# Patient Record
Sex: Female | Born: 1970 | Race: White | Hispanic: No | Marital: Married | Smoking: Current some day smoker
Health system: Southern US, Community
[De-identification: ages and names within clinical notes are randomized; demographics above are authoritative.]

## PROBLEM LIST (undated history)

## (undated) DIAGNOSIS — F419 Anxiety disorder, unspecified: Secondary | ICD-10-CM

## (undated) DIAGNOSIS — M1990 Unspecified osteoarthritis, unspecified site: Secondary | ICD-10-CM

## (undated) DIAGNOSIS — N360 Urethral fistula: Secondary | ICD-10-CM

## (undated) DIAGNOSIS — N3946 Mixed incontinence: Secondary | ICD-10-CM

## (undated) DIAGNOSIS — G5601 Carpal tunnel syndrome, right upper limb: Secondary | ICD-10-CM

## (undated) DIAGNOSIS — J45909 Unspecified asthma, uncomplicated: Secondary | ICD-10-CM

## (undated) DIAGNOSIS — Z87440 Personal history of urinary (tract) infections: Secondary | ICD-10-CM

## (undated) DIAGNOSIS — T8859XA Other complications of anesthesia, initial encounter: Secondary | ICD-10-CM

## (undated) DIAGNOSIS — M0600 Rheumatoid arthritis without rheumatoid factor, unspecified site: Secondary | ICD-10-CM

## (undated) DIAGNOSIS — F32A Depression, unspecified: Secondary | ICD-10-CM

## (undated) DIAGNOSIS — Z8619 Personal history of other infectious and parasitic diseases: Secondary | ICD-10-CM

## (undated) DIAGNOSIS — I10 Essential (primary) hypertension: Secondary | ICD-10-CM

## (undated) DIAGNOSIS — F329 Major depressive disorder, single episode, unspecified: Secondary | ICD-10-CM

## (undated) DIAGNOSIS — G43019 Migraine without aura, intractable, without status migrainosus: Secondary | ICD-10-CM

## (undated) DIAGNOSIS — M797 Fibromyalgia: Secondary | ICD-10-CM

## (undated) HISTORY — DX: Personal history of other infectious and parasitic diseases: Z86.19

## (undated) HISTORY — DX: Urethral fistula: N36.0

## (undated) HISTORY — DX: Personal history of urinary (tract) infections: Z87.440

## (undated) HISTORY — DX: Depression, unspecified: F32.A

## (undated) HISTORY — DX: Mixed incontinence: N39.46

## (undated) HISTORY — DX: Major depressive disorder, single episode, unspecified: F32.9

## (undated) HISTORY — DX: Unspecified osteoarthritis, unspecified site: M19.90

## (undated) HISTORY — DX: Rheumatoid arthritis without rheumatoid factor, unspecified site: M06.00

## (undated) HISTORY — DX: Carpal tunnel syndrome, right upper limb: G56.01

## (undated) HISTORY — DX: Fibromyalgia: M79.7

## (undated) HISTORY — DX: Migraine without aura, intractable, without status migrainosus: G43.019

## (undated) HISTORY — DX: Essential (primary) hypertension: I10

---

## 2006-11-28 DIAGNOSIS — Z8619 Personal history of other infectious and parasitic diseases: Secondary | ICD-10-CM

## 2006-11-28 HISTORY — DX: Personal history of other infectious and parasitic diseases: Z86.19

## 2017-03-01 LAB — TSH: TSH: 1.45 (ref 0.41–5.90)

## 2017-03-01 LAB — BASIC METABOLIC PANEL
BUN: 13 (ref 4–21)
Creatinine: 0.8 (ref 0.5–1.1)
Glucose: 99
POTASSIUM: 4.6 (ref 3.4–5.3)
SODIUM: 140 (ref 137–147)

## 2017-03-01 LAB — HEPATIC FUNCTION PANEL
ALK PHOS: 34 (ref 25–125)
ALT: 22 (ref 7–35)
AST: 21 (ref 13–35)
Bilirubin, Total: 0.5

## 2017-03-02 ENCOUNTER — Other Ambulatory Visit: Payer: Self-pay | Admitting: Family Medicine

## 2017-03-02 DIAGNOSIS — E010 Iodine-deficiency related diffuse (endemic) goiter: Secondary | ICD-10-CM

## 2017-03-10 ENCOUNTER — Ambulatory Visit
Admission: RE | Admit: 2017-03-10 | Discharge: 2017-03-10 | Disposition: A | Discharge status: Home / Self Care | Payer: 59 | Source: Ambulatory Visit | Attending: Family Medicine | Admitting: Family Medicine

## 2017-03-10 DIAGNOSIS — E010 Iodine-deficiency related diffuse (endemic) goiter: Secondary | ICD-10-CM

## 2017-03-15 ENCOUNTER — Ambulatory Visit (INDEPENDENT_AMBULATORY_CARE_PROVIDER_SITE_OTHER): Payer: Self-pay | Admitting: Neurology

## 2017-03-15 ENCOUNTER — Ambulatory Visit (INDEPENDENT_AMBULATORY_CARE_PROVIDER_SITE_OTHER): Payer: 59 | Admitting: Neurology

## 2017-03-15 ENCOUNTER — Encounter: Payer: Self-pay | Admitting: Neurology

## 2017-03-15 DIAGNOSIS — G5601 Carpal tunnel syndrome, right upper limb: Secondary | ICD-10-CM | POA: Diagnosis not present

## 2017-03-15 HISTORY — DX: Carpal tunnel syndrome, right upper limb: G56.01

## 2017-03-15 NOTE — Progress Notes (Signed)
Please refer to EMG and nerve conduction study procedure note. 

## 2017-03-15 NOTE — Procedures (Signed)
     HISTORY:  Krystal Grant is a 46 year old patient with a history of right hand numbness and paresthesias with discomfort going into the right shoulder since February 2018. At times, the discomfort can be quite severe. The patient denies any neck pain or headaches. At times she may have minimal symptoms of numbness involving the left hand, but her predominant symptoms are involving the right hand and right arm.  NERVE CONDUCTION STUDIES:  Nerve conduction studies were performed on both upper extremities. The distal motor latencies for the median nerves were prolonged on the right, normal on the left, and normal for the ulnar nerves bilaterally. The motor amplitudes for the median and ulnar nerves were normal bilaterally, and the nerve conduction velocities for these nerves were also normal. The sensory latencies for the median nerves were prolonged on the right, normal on the left, and normal for the ulnar nerves bilaterally. The F wave latencies for the median and ulnar nerves were normal bilaterally.  EMG STUDIES:  EMG study was performed on the right upper extremity:  The first dorsal interosseous muscle reveals 2 to 4 K units with full recruitment. No fibrillations or positive waves were noted. The abductor pollicis brevis muscle reveals 2 to 4 K units with full recruitment. No fibrillations or positive waves were noted. The extensor indicis proprius muscle reveals 1 to 3 K units with full recruitment. No fibrillations or positive waves were noted. The pronator teres muscle reveals 2 to 3 K units with full recruitment. No fibrillations or positive waves were noted. The biceps muscle reveals 1 to 2 K units with full recruitment. No fibrillations or positive waves were noted. The triceps muscle reveals 2 to 4 K units with full recruitment. No fibrillations or positive waves were noted. The anterior deltoid muscle reveals 2 to 3 K units with full recruitment. No fibrillations or positive  waves were noted. The cervical paraspinal muscles were tested at 2 levels. No abnormalities of insertional activity were seen at either level tested. There was fair relaxation.   IMPRESSION:  Nerve conduction studies done on both upper extremities revealed evidence of a mild right carpal tunnel syndrome. EMG evaluation of the right upper extremity was unremarkable, without evidence of an overlying cervical radiculopathy.  Marlan Palau MD 03/15/2017 11:32 AM  [facility] [address] [place], [place] [address]  Phone [phone] Fax [phone]

## 2017-06-28 HISTORY — PX: CARPAL TUNNEL RELEASE: SHX101

## 2017-06-28 HISTORY — PX: ELBOW ARTHROSCOPY: SUR87

## 2018-01-18 LAB — HEPATIC FUNCTION PANEL
ALK PHOS: 34 (ref 25–125)
ALT: 15 (ref 7–35)
AST: 17 (ref 13–35)
Bilirubin, Total: 0.4

## 2018-01-18 LAB — BASIC METABOLIC PANEL
BUN: 10 (ref 4–21)
Creatinine: 0.8 (ref 0.5–1.1)
Glucose: 105
Potassium: 4.5 (ref 3.4–5.3)
SODIUM: 140 (ref 137–147)

## 2018-01-18 LAB — TSH: TSH: 1 (ref 0.41–5.90)

## 2018-01-21 ENCOUNTER — Encounter (HOSPITAL_COMMUNITY): Payer: Self-pay

## 2018-01-21 ENCOUNTER — Emergency Department (HOSPITAL_COMMUNITY)
Admission: EM | Admit: 2018-01-21 | Discharge: 2018-01-21 | Disposition: A | Discharge status: Home / Self Care | Payer: 59 | Attending: Emergency Medicine | Admitting: Emergency Medicine

## 2018-01-21 DIAGNOSIS — Z79899 Other long term (current) drug therapy: Secondary | ICD-10-CM | POA: Insufficient documentation

## 2018-01-21 DIAGNOSIS — R42 Dizziness and giddiness: Secondary | ICD-10-CM | POA: Diagnosis not present

## 2018-01-21 DIAGNOSIS — R195 Other fecal abnormalities: Secondary | ICD-10-CM | POA: Insufficient documentation

## 2018-01-21 LAB — COMPREHENSIVE METABOLIC PANEL
ALT: 16 U/L (ref 14–54)
AST: 24 U/L (ref 15–41)
Albumin: 4.2 g/dL (ref 3.5–5.0)
Alkaline Phosphatase: 42 U/L (ref 38–126)
Anion gap: 9 (ref 5–15)
BILIRUBIN TOTAL: 0.3 mg/dL (ref 0.3–1.2)
BUN: 8 mg/dL (ref 6–20)
CO2: 23 mmol/L (ref 22–32)
Calcium: 9.3 mg/dL (ref 8.9–10.3)
Chloride: 108 mmol/L (ref 101–111)
Creatinine, Ser: 0.72 mg/dL (ref 0.44–1.00)
GFR calc Af Amer: >60 mL/min (ref >60)
Glucose, Bld: 91 mg/dL (ref 65–99)
POTASSIUM: 4.4 mmol/L (ref 3.5–5.1)
Sodium: 140 mmol/L (ref 135–145)
Total Protein: 6.6 g/dL (ref 6.5–8.1)

## 2018-01-21 LAB — CBC
HEMATOCRIT: 36.6 % (ref 36.0–46.0)
Hemoglobin: 12.2 g/dL (ref 12.0–15.0)
MCH: 28.8 pg (ref 26.0–34.0)
MCHC: 33.3 g/dL (ref 30.0–36.0)
MCV: 86.5 fL (ref 78.0–100.0)
PLATELETS: 267 10*3/uL (ref 150–400)
RBC: 4.23 MIL/uL (ref 3.87–5.11)
RDW: 14.3 % (ref 11.5–15.5)
WBC: 4.9 10*3/uL (ref 4.0–10.5)

## 2018-01-21 LAB — I-STAT BETA HCG BLOOD, ED (MC, WL, AP ONLY): I-stat hCG, quantitative: <5 m[IU]/mL (ref <5)

## 2018-01-21 LAB — POC OCCULT BLOOD, ED: Fecal Occult Bld: NEGATIVE

## 2018-01-21 LAB — TYPE AND SCREEN
ABO/RH(D): O POS
Antibody Screen: NEGATIVE

## 2018-01-21 LAB — ABO/RH: ABO/RH(D): O POS

## 2018-01-21 MED ORDER — ONDANSETRON 8 MG PO TBDP: 8.0000 mg | ORAL_TABLET | Freq: Three times a day (TID) | ORAL | 0 refills | Status: DC | PRN

## 2018-01-21 MED ORDER — PROMETHAZINE HCL 25 MG PO TABS: 25.0000 mg | ORAL_TABLET | Freq: Four times a day (QID) | ORAL | 0 refills | Status: DC | PRN

## 2018-01-21 NOTE — ED Triage Notes (Addendum)
Pt presents for evaluation of possible GI bleeding x 4 days. Pt reports has had some dizziness/nausea x multiple months, PCP placed on abx last week (doxycycline) for possible ear infection and since Thursday has cramping and tarry stools.

## 2018-01-21 NOTE — ED Notes (Signed)
D/c prescriptions discussed with pt.  Pt reports she is already taking Zofran with no relief.  Pt wants to know if Phenergan can be prescribed.   Phenergan prescription given by Dr. Lynelle DoctorKnapp.  Zofran prescription discarded in shredder box.

## 2018-01-21 NOTE — Discharge Instructions (Signed)
Your blood test today were reassuring.  No evidence of blood in your stool.  Follow-up with a neurologist considering your persistent dizziness as we discussed.

## 2018-01-21 NOTE — ED Provider Notes (Signed)
[facility] EMERGENCY DEPARTMENT Provider Note   CSN: [id number] Arrival date & time: 01/21/18  1324     History   Chief Complaint Chief Complaint  Patient presents with  . GI Bleeding    HPI Krystal Grant is a 47 y.o. female.  HPI Pt has been having trouble with nause and dizziness for several months but worse in the past week..  Dizziness does get worse with certain positions and she saw her PCP and was treated for possible ineer ear infection a week ago.  Was started on doxycycline last week.  In the last few days she noticed dark black stools abdominal cramping.Marland Kitchen  She was worried about posible gi bleeding so she came to the hospital for evaluation..  Patient has been taking Pepto-Bismol for her nausea. Past Medical History:  Diagnosis Date  . Carpal tunnel syndrome on right 03/15/2017    Patient Active Problem List   Diagnosis Date Noted  . Carpal tunnel syndrome on right 03/15/2017    History reviewed. No pertinent surgical history.  OB History    No data available       Home Medications    Prior to Admission medications   Medication Sig Start Date End Date Taking? Authorizing Provider  clonazePAM (KLONOPIN) 0.5 MG tablet Take 0.5 mg by mouth daily as needed. 01/16/18  Yes [provider]  doxycycline (VIBRAMYCIN) 100 MG capsule Take 100 mg by mouth 2 (two) times daily. Started on 01-16-18 01/16/18  Yes [provider]  gabapentin (NEURONTIN) 100 MG capsule Take 200 mg by mouth 3 (three) times daily.   Yes [provider]  Multiple Vitamins-Minerals (MULTIVITAMIN WITH MINERALS) tablet Take 1 tablet by mouth daily.   Yes [provider]  ondansetron (ZOFRAN) 4 MG tablet Take 8 mg by mouth 2 (two) times daily as needed for nausea or vomiting.   Yes [provider]  ranitidine (ZANTAC) 75 MG tablet Take 75 mg by mouth at bedtime.   Yes [provider]  traMADol (ULTRAM) 50 MG tablet Take 50 mg by  mouth every 6 (six) hours as needed for moderate pain.   Yes [provider]  ondansetron (ZOFRAN ODT) 8 MG disintegrating tablet Take 1 tablet (8 mg total) by mouth every 8 (eight) hours as needed for nausea or vomiting. 01/21/18   Linwood Dibbles, MD    Family History No family history on file.  Social History Social History   Tobacco Use  . Smoking status: Not on file  Substance Use Topics  . Alcohol use: Not on file  . Drug use: Not on file     Allergies   Patient has no known allergies.   Review of Systems Review of Systems  All other systems reviewed and are negative.    Physical Exam Updated Vital Signs BP 101/65   Pulse 87   Temp 98.8 F (37.1 C) (Oral)   Resp 16   LMP 01/04/2018 (Approximate)   SpO2 100%   Physical Exam  Constitutional: She appears well-developed and well-nourished. No distress.  HENT:  Head: Normocephalic and atraumatic.  Right Ear: External ear normal.  Left Ear: External ear normal.  Eyes: Conjunctivae are normal. Right eye exhibits no discharge. Left eye exhibits no discharge. No scleral icterus.  Neck: Neck supple. No tracheal deviation present.  Cardiovascular: Normal rate, regular rhythm and intact distal pulses.  Pulmonary/Chest: Effort normal and breath sounds normal. No stridor. No respiratory distress. She has no wheezes. She has no  rales.  Abdominal: Soft. Bowel sounds are normal. She exhibits no distension. There is no tenderness. There is no rebound and no guarding.  Genitourinary:  Genitourinary Comments: Dark stools, no gross blood on rectal exam  Musculoskeletal: She exhibits no edema or tenderness.  Neurological: She is alert. She has normal strength. No cranial nerve deficit (no facial droop, extraocular movements intact, no slurred speech) or sensory deficit. She exhibits normal muscle tone. She displays no seizure activity. Coordination normal.  Skin: Skin is warm and dry. No rash noted.  Psychiatric: She has a  normal mood and affect.  Nursing note and vitals reviewed.    ED Treatments / Results  Labs (all labs ordered are listed, but only abnormal results are displayed) Labs Reviewed  COMPREHENSIVE METABOLIC PANEL  CBC  POC OCCULT BLOOD, ED  I-STAT BETA HCG BLOOD, ED ([facility], [facility], [facility] ONLY)  TYPE AND SCREEN  ABO/RH      Procedures Procedures (including critical care time)  Medications Ordered in ED Medications - No data to display   Initial Impression / Assessment and Plan / ED Course  I have reviewed the triage vital signs and the nursing notes.  Pertinent labs & imaging results that were available during my care of the patient were reviewed by me and considered in my medical decision making (see chart for details).   Patient presented to the emergency room for evaluation of persistent dizziness.  Patient also noticed that her stools were dark recently so she was worried about intestinal bleeding.  Patient is guaiac test is negative.  She has been taking Pepto-Bismol and I think that is most likely the cause of her dark stools.  I reassured the patient.  She has been having persistent dizziness and vertigo type symptoms for several months.  I will gave her the name of a neurologist for further outpatient evaluation.  Patient is not having any focal neurologic symptoms.  I do not think there is a central cause for her vertigo.  Final Clinical Impressions(s) / ED Diagnoses   Final diagnoses:  Dizziness  Dark stools    ED Discharge Orders        Ordered    ondansetron (ZOFRAN ODT) 8 MG disintegrating tablet  Every 8 hours PRN     01/21/18 1557       Linwood DibblesKnapp, Chaniyah Jahr, MD 01/21/18 1559

## 2018-01-25 ENCOUNTER — Encounter: Payer: Self-pay | Admitting: Neurology

## 2018-01-25 ENCOUNTER — Ambulatory Visit: Payer: 59 | Admitting: Neurology

## 2018-01-25 VITALS — BP 120/84 | HR 85 | Ht 63.0 in | Wt 104.5 lb

## 2018-01-25 DIAGNOSIS — G43019 Migraine without aura, intractable, without status migrainosus: Secondary | ICD-10-CM | POA: Diagnosis not present

## 2018-01-25 DIAGNOSIS — A881 Epidemic vertigo: Secondary | ICD-10-CM | POA: Diagnosis not present

## 2018-01-25 DIAGNOSIS — R42 Dizziness and giddiness: Secondary | ICD-10-CM

## 2018-01-25 HISTORY — DX: Migraine without aura, intractable, without status migrainosus: G43.019

## 2018-01-25 MED ORDER — ALPRAZOLAM 0.5 MG PO TABS: ORAL_TABLET | ORAL | 0 refills | Status: DC

## 2018-01-25 MED ORDER — TOPIRAMATE 25 MG PO TABS: ORAL_TABLET | ORAL | 3 refills | Status: DC

## 2018-01-25 NOTE — Patient Instructions (Signed)
   We will check MRI of the brain and start Topamax for the headache.  Topamax (topiramate) is a seizure medication that has an FDA approval for seizures and for migraine headache. Potential side effects of this medication include weight loss, cognitive slowing, tingling in the fingers and toes, and carbonated drinks will taste bad. If any significant side effects are noted on this drug, please contact our office.

## 2018-01-25 NOTE — Progress Notes (Signed)
Reason for visit: Vertigo, headaches  Referring physician: [place]  Krystal Grant is a 47 y.o. female  History of present illness:  Krystal Grant is a 47 year old right-handed white female with a history of onset of vertigo that began around the end of October 2018.  The patient has noted that the vertigo is a daily event, and usually occurs when she first gets out of bed when she is changing position.  If she sits still the vertigo will last only about 20 seconds or so.  On one occasion she has had vomiting associated with the vertigo.  The patient may note vertigo that may also occur when she turns her head too quickly.  She has had occasional episodes of tingling sensations around the jaw and lips and tongue.  She denies any numbness or weakness of the arms or legs or body.  She has had some blurring of vision, no double vision.  She denies any slurred speech or trouble swallowing.  Within the last 6-8 weeks she has also developed headaches.  The patient is now having about 12 headache days of month.  The patient indicates that the headaches are bifrontal and temporal in nature.  The patient may occasionally have some discomfort down in the left neck and shoulder and in the back of the head.  The patient indicates that her mother has a history of migraine headaches.  The patient denies any episodes of syncope.  She does believe that her balance is off slightly since the vertigo started.  She denies issues controlling the bowels or the bladder.  She comes to this office for further evaluation.  She was recently seen in the emergency room on 21 January 2018 secondary to the vertigo.  Past Medical History:  Diagnosis Date  . Carpal tunnel syndrome on right 03/15/2017  . Common migraine with intractable migraine 01/25/2018    History reviewed. No pertinent surgical history.  Family History  Problem Relation Age of Onset  . Migraines Mother   . Hypertension Mother   . Heart attack Mother      Social history:  reports that she has been smoking.  she has never used smokeless tobacco. She reports that she does not drink alcohol or use drugs.  Medications:  Prior to Admission medications   Medication Sig Start Date End Date Taking? Authorizing Provider  gabapentin (NEURONTIN) 100 MG capsule Take 200 mg by mouth 3 (three) times daily.   Yes [provider]  Multiple Vitamins-Minerals (MULTIVITAMIN WITH MINERALS) tablet Take 1 tablet by mouth daily.   Yes [provider]  ondansetron (ZOFRAN ODT) 8 MG disintegrating tablet Take 1 tablet (8 mg total) by mouth every 8 (eight) hours as needed for nausea or vomiting. 01/21/18  Yes Linwood Dibbles, MD  promethazine (PHENERGAN) 25 MG tablet Take 1 tablet (25 mg total) by mouth every 6 (six) hours as needed for nausea or vomiting. 01/21/18  Yes Linwood Dibbles, MD  ranitidine (ZANTAC) 75 MG tablet Take 75 mg by mouth at bedtime.   Yes [provider]  traMADol (ULTRAM) 50 MG tablet Take 50 mg by mouth every 6 (six) hours as needed for moderate pain.   Yes [provider]  ALPRAZolam Prudy Feeler) 0.5 MG tablet Take 2 tablets approximately 45 minutes prior to the MRI study, take a third tablet if needed. 01/25/18   York Spaniel, MD  clonazePAM (KLONOPIN) 0.5 MG tablet Take 0.5 mg by mouth daily as needed. 01/16/18   [provider]  topiramate (TOPAMAX) 25 MG tablet Take one tablet at night for one week, then take 2 tablets at night for one week, then take 3 tablets at night. 01/25/18   York SpanielWillis, Tereka Thorley K, MD     No Known Allergies  ROS:  Out of a complete 14 system review of symptoms, the patient complains only of the following symptoms, and all other reviewed systems are negative.  Weight loss, fatigue Hearing loss, ringing in ears, dizziness Blurred vision Feeling cold Joint pain Confusion Anxiety Restless legs  Blood pressure 120/84, pulse 85, height 5\' 3"  (1.6 m), weight 104 lb 8 oz (47.4 kg), last  menstrual period 01/04/2018.  Physical Exam  General: The patient is alert and cooperative at the time of the examination.  Eyes: Pupils are equal, round, and reactive to light. Discs are flat bilaterally.  Ears: Tympanic membranes are clear bilaterally.  Neck: The neck is supple, no carotid bruits are noted.  Respiratory: The respiratory examination is clear.  Cardiovascular: The cardiovascular examination reveals a regular rate and rhythm, no obvious murmurs or rubs are noted.  Skin: Extremities are without significant edema.  Neurologic Exam  Mental status: The patient is alert and oriented x 3 at the time of the examination. The patient has apparent normal recent and remote memory, with an apparently normal attention span and concentration ability.  Cranial nerves: Facial symmetry is present. There is good sensation of the face to pinprick and soft touch bilaterally. The strength of the facial muscles and the muscles to head turning and shoulder shrug are normal bilaterally. Speech is well enunciated, no aphasia or dysarthria is noted. Extraocular movements are full. Visual fields are full. The tongue is midline, and the patient has symmetric elevation of the soft palate. No obvious hearing deficits are noted.  Motor: The motor testing reveals 5 over 5 strength of all 4 extremities. Good symmetric motor tone is noted throughout.  Sensory: Sensory testing is intact to pinprick, soft touch, vibration sensation, and position sense on all 4 extremities. No evidence of extinction is noted.  Coordination: Cerebellar testing reveals good finger-nose-finger and heel-to-shin bilaterally. The Nyan-Barrany procedure was performed, the patient does have rotatory and horizontal nystagmus with horizontal gaze bilaterally, possibly more prominent when looking to the left, the procedure did not induce vertigo.  Gait and station: Gait is normal. Tandem gait is normal. Romberg is negative. No drift  is seen.  Reflexes: Deep tendon reflexes are symmetric and normal bilaterally. Toes are downgoing bilaterally.   Assessment/Plan:  1.  Episodic vertigo, positional  2.  Headache, probable migraine  The vertigo and headache may be associated with one another, vertigo may be a manifestation of a migraine syndrome.  The patient will be started on Topamax, this could potentially help the headache and the vertigo itself.  The patient will be sent for MRI of the brain.  The patient will follow-up in 3 months.  If the Topamax has not improved the vertigo, a trial with vestibular rehabilitation may be undertaken.  Marlan Palau. Keith Nakaila Freeze MD 01/25/2018 10:45 AM  [facility] [address] [place], [place] [id number]  Phone [phone] Fax [phone]

## 2018-01-26 ENCOUNTER — Ambulatory Visit
Admission: RE | Admit: 2018-01-26 | Discharge: 2018-01-26 | Disposition: A | Discharge status: Home / Self Care | Payer: 59 | Source: Ambulatory Visit | Attending: Neurology | Admitting: Neurology

## 2018-01-26 DIAGNOSIS — A881 Epidemic vertigo: Secondary | ICD-10-CM

## 2018-01-26 DIAGNOSIS — G43019 Migraine without aura, intractable, without status migrainosus: Secondary | ICD-10-CM

## 2018-01-26 MED ORDER — GADOBENATE DIMEGLUMINE 529 MG/ML IV SOLN
10.0000 mL | Freq: Once | INTRAVENOUS | Status: AC | PRN
  Administered 2018-01-26: 10 mL via INTRAVENOUS

## 2018-01-28 ENCOUNTER — Telehealth: Payer: Self-pay | Admitting: Neurology

## 2018-01-28 NOTE — Telephone Encounter (Signed)
I called the patient.  The MRI of the brain is completely unremarkable.  The patient is concerned about a pressure sensation behind the ear, no evidence of any fluid collection or sinus issues on MRI.  The patient has not yet started Topamax, she will try to get on the medication soon.   MRI brain 01/26/18:  IMPRESSION:  Unremarkable MRI scan of the brain with and without contrast

## 2018-02-04 ENCOUNTER — Other Ambulatory Visit: Payer: 59

## 2018-02-12 ENCOUNTER — Other Ambulatory Visit: Payer: Self-pay | Admitting: Neurology

## 2018-03-19 ENCOUNTER — Telehealth: Payer: Self-pay | Admitting: *Deleted

## 2018-03-19 NOTE — Telephone Encounter (Signed)
Called pt, LVM for her to call back. Advised we received refill request to send 90 days supply for topamax 25mg  tab. Wanted to see if she had started medication, how she was doing on it, etc.   She was to titrate up as follows: "Take one tablet at night for one week, then take 2 tablets at night for one week, then take 3 tablets at night". If she is up to the 3 tablets at night and doing well/no SE, then we can call in maintenance dose as 90 days supply if she would like.

## 2018-03-20 NOTE — Telephone Encounter (Signed)
Left message requesting a return call.

## 2018-03-20 NOTE — Telephone Encounter (Signed)
Left another message requesting a return call. 

## 2018-04-26 ENCOUNTER — Ambulatory Visit: Payer: 59 | Admitting: Nurse Practitioner

## 2018-06-20 ENCOUNTER — Encounter

## 2018-06-20 ENCOUNTER — Ambulatory Visit: Payer: 59 | Admitting: Family Medicine

## 2018-06-20 ENCOUNTER — Encounter: Payer: Self-pay | Admitting: Family Medicine

## 2018-06-20 VITALS — BP 122/82 | HR 92 | Temp 98.7°F | Ht 63.0 in | Wt 116.0 lb

## 2018-06-20 DIAGNOSIS — Z8669 Personal history of other diseases of the nervous system and sense organs: Secondary | ICD-10-CM | POA: Diagnosis not present

## 2018-06-20 DIAGNOSIS — R42 Dizziness and giddiness: Secondary | ICD-10-CM

## 2018-06-20 DIAGNOSIS — Z1231 Encounter for screening mammogram for malignant neoplasm of breast: Secondary | ICD-10-CM | POA: Diagnosis not present

## 2018-06-20 DIAGNOSIS — Z23 Encounter for immunization: Secondary | ICD-10-CM

## 2018-06-20 DIAGNOSIS — Z1239 Encounter for other screening for malignant neoplasm of breast: Secondary | ICD-10-CM

## 2018-06-20 DIAGNOSIS — M1990 Unspecified osteoarthritis, unspecified site: Secondary | ICD-10-CM

## 2018-06-20 DIAGNOSIS — H938X2 Other specified disorders of left ear: Secondary | ICD-10-CM

## 2018-06-20 DIAGNOSIS — F411 Generalized anxiety disorder: Secondary | ICD-10-CM

## 2018-06-20 DIAGNOSIS — H9192 Unspecified hearing loss, left ear: Secondary | ICD-10-CM | POA: Insufficient documentation

## 2018-06-20 MED ORDER — ALPRAZOLAM 0.5 MG PO TABS: 0.5000 mg | ORAL_TABLET | Freq: Two times a day (BID) | ORAL | 2 refills | Status: DC | PRN

## 2018-06-20 NOTE — Assessment & Plan Note (Signed)
Progressive issue- currently taking Gabapentin, flexeril and tramadol as needed for severe arthritic pain. Awaiting records from [facility] (her previous PCP).

## 2018-06-20 NOTE — Assessment & Plan Note (Signed)
Consistent with ETD- ? Due to allergic rhinitis - started when she moved here from [place], new allergens in this area. See below- advised to start Allegra D and flonase daily, referred to ENT. The patient indicates understanding of these issues and agrees with the plan.

## 2018-06-20 NOTE — Assessment & Plan Note (Signed)
Deteriorated due to acute stressors. She is declining daily SSRI or buspar at this time- feels she will know more how she is doing after the legal proceedings are over. Rx printed to pt for xanax- advised to use for panic attacks ONLY and to use sparingly- discussed sedation and addiction precautions. I did look her up in the [place] controlled substances data base.  No red flags.  She has been on opioids, including tramadol for arthritis. She is agreeable to psychotherapy -referral placed. Follow up in 3 months.

## 2018-06-20 NOTE — Progress Notes (Signed)
Subjective:   Patient ID: Krystal Grant, female    DOB: Mar 09, 1971, 47 y.o.   MRN: [id number]  Krystal Grant is a pleasant 47 y.o. year old female who presents to clinic today with New Patient (Initial Visit) (Patient is here today to establish care.  She agrees to get Tdap today.  She had coffee with cream and sugar this am. She agrees to get Mammogram at [facility] and will call if order is created. Her last PAP was in 2018 and was WNL.  She would like to have them done here from now on.  She had HIV tests during pregnancy WNL so declines lab test.  Having hearing loss in [place] x43yrs.  Getting bouts of vertigo and nausea and feels like it is due to her ear.  Is going to get records sent from [facility] ) and addendum (showing her arthritis. She has random attacks of panic due to anxiety and is going through extreme personal problems.  See hearing screening results.  See PHQ-GAD.)  on 06/20/2018  HPI:  Left whoosing noise in left ear- and pressure, has felt it since she moved her from [place] two years ago.  Vertigo- has seen neurology, Dr. Anne Hahn for this and vertigo.  Last saw him on 01/25/18 for vertigo- note reviewed. He felt perhaps the vertigo was associated with or a manifestation of her migraine headaches. Started her on Topamax at that time. She never started the topamax as she did not feel it migraine related. Sometimes vertigo is so severe she vomits.  Worsened by changes in head position.  Has not tried meclizine.  MRI of brain from 01/26/18 reviewed today- negative.  Has tried allegra which was helpful.  Intermittently tried nasal spray.  Arthritis- currently taking Gabapentin, flexeril and tramadol as needed for severe arthritic pain.   GAD- long history of anxiety and has been on zoloft- gave her "brain zaps," and Cymbalta in past which caused disorientation. Lately anxiety is much worse but she feels it is all situational.  Moved here from [place] to help care for her dying  step mom who has since died. Her son was arrested for a crime that he did while he was having a psychotic break.  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  She is very anxious about the trial/legal proceeding that he has coming up.  Having panic attacks.  Has taken xanax in past for panic attacks but tries not to take anything.   Denies feeling depressed.  Current Outpatient Medications on File Prior to Visit  Medication Sig Dispense Refill  . b complex vitamins capsule Take 1 capsule by mouth daily.    . cyclobenzaprine (FLEXERIL) 10 MG tablet Take 1 tablet by mouth 3 (three) times daily as needed.    . gabapentin (NEURONTIN) 300 MG capsule Take 1 capsule by mouth 4 (four) times daily.  2  . Multiple Vitamins-Minerals (MULTIVITAMIN WITH MINERALS) tablet Take 1 tablet by mouth daily.    Marland Kitchen PINE BARK, PYCNOGENOL, PO Take 1 tablet by mouth daily.    . traMADol (ULTRAM) 50 MG tablet Take 50 mg by mouth every 6 (six) hours as needed for moderate pain.    . vitamin C (ASCORBIC ACID) 500 MG tablet Take 500 mg by mouth 2 (two) times daily.     No current facility-administered medications on file prior to visit.     Allergies  Allergen Reactions  . Cymbalta [Duloxetine Hcl]  disorientation    Past Medical History:  Diagnosis Date  . Arthritis   . Carpal tunnel syndrome on right 03/15/2017  . Common migraine with intractable migraine 01/25/2018  . Depression   . History of chicken pox   . History of shingles 2008  . History of UTI     No past surgical history on file.  Family History  Problem Relation Age of Onset  . Migraines Mother   . Hypertension Mother   . Heart attack Mother   . Alcohol abuse Sister   . Depression Sister   . Asthma Son   . Depression Son   . Mental illness Son   . Learning disabilities Son   . Arthritis Maternal Grandmother   . Hearing loss Maternal Grandmother   . Heart attack Maternal Grandfather   . Cancer Paternal Grandmother     . Hearing loss Paternal Grandmother   . Heart attack Paternal Grandmother   . Heart attack Paternal Grandfather     Social History   Socioeconomic History  . Marital status: Married    Spouse name: Not on file  . Number of children: 3  . Years of education: 19  . Highest education level: Not on file  Occupational History  . Occupation: [employer]   Social Needs  . Financial resource strain: Not on file  . Food insecurity:    Worry: Not on file    Inability: Not on file  . Transportation needs:    Medical: Not on file    Non-medical: Not on file  Tobacco Use  . Smoking status: Former Smoker    Types: Cigarettes    Last attempt to quit: 04/20/2018    Years since quitting: 0.1  . Smokeless tobacco: Never Used  Substance and Sexual Activity  . Alcohol use: No    Frequency: Never  . Drug use: No  . Sexual activity: Not on file  Lifestyle  . Physical activity:    Days per week: Not on file    Minutes per session: Not on file  . Stress: Not on file  Relationships  . Social connections:    Talks on phone: Not on file    Gets together: Not on file    Attends religious service: Not on file    Active member of club or organization: Not on file    Attends meetings of clubs or organizations: Not on file    Relationship status: Not on file  . Intimate partner violence:    Fear of current or ex partner: Not on file    Emotionally abused: Not on file    Physically abused: Not on file    Forced sexual activity: Not on file  Other Topics Concern  . Not on file  Social History Narrative   Lives with husband and kids   Caffeine use: 2 per day   Right handed    The PMH, PSH, Social History, Family History, Medications, and allergies have been reviewed in [facility], and have been updated if relevant.   Review of Systems  Constitutional: Negative.   HENT: Positive for congestion, hearing loss and sinus pressure. Negative for dental problem, drooling, ear discharge, ear pain,  facial swelling, mouth sores, nosebleeds, postnasal drip, rhinorrhea, sinus pain, sneezing, sore throat, tinnitus, trouble swallowing and voice change.   Gastrointestinal: Positive for nausea and vomiting.  Musculoskeletal: Positive for arthralgias.  Neurological: Positive for dizziness. Negative for tremors, seizures, syncope, facial asymmetry, speech difficulty, weakness, light-headedness, numbness and headaches.  Psychiatric/Behavioral: Positive for sleep disturbance. Negative for agitation, behavioral problems, confusion, decreased concentration, dysphoric mood, hallucinations, self-injury and suicidal ideas. The patient is nervous/anxious. The patient is not hyperactive.   All other systems reviewed and are negative.      Objective:    BP 122/82 (BP Location: Left Arm, Patient Position: Sitting, Cuff Size: Normal)   Pulse 92   Temp 98.7 F (37.1 C) (Oral)   Ht 5\' 3"  (1.6 m)   Wt 116 lb (52.6 kg)   LMP 06/11/2018   SpO2 99%   BMI 20.55 kg/m    Physical Exam  Constitutional: She is oriented to person, place, and time. She appears well-developed and well-nourished. No distress.  HENT:  Head: Normocephalic and atraumatic.  Right Ear: No drainage.  Left Ear: No drainage. A middle ear effusion is present.  Nose: Mucosal edema present.  Both ear canal narrow, does have some visible effusion on left side  Eyes: EOM are normal.  Neck: Normal range of motion.  Pulmonary/Chest: Effort normal.  Neurological: She is alert and oriented to person, place, and time. She has normal strength. She is not disoriented. No cranial nerve deficit or sensory deficit.  No nystagmus with dix hall pike  Skin: Skin is warm and dry. She is not diaphoretic.  Psychiatric: She has a normal mood and affect. Her behavior is normal. Judgment and thought content normal.  Nursing note and vitals reviewed.         Assessment & Plan:   Vertigo - Plan: Ambulatory referral to ENT  Need for Tdap  vaccination - Plan: Tdap vaccine greater than or equal to 7yo IM  History of migraine headaches  Screening for breast cancer - Plan: MM Digital Screening  Hearing loss of left ear, unspecified hearing loss type  Ear pressure, left  GAD (generalized anxiety disorder) - Plan: Ambulatory referral to Psychology No follow-ups on file.

## 2018-06-20 NOTE — Assessment & Plan Note (Signed)
Intermittent and persisting over past 2 years.  Likely related to ETD of left ear.  Advised starting flonase and allegra D daily, refer to ENT for further work up/?drain of left TM. Also advised discussing vestibular rehab with ENT. The patient indicates understanding of these issues and agrees with the plan.

## 2018-06-20 NOTE — Patient Instructions (Addendum)
Great to meet you.  [facility] call [facility] to schedule your mammogram.  Please start using flonase and allegra D daily.  We are referring you to an Ear Nose and Throat doctor and a therapist- we will call you with these appointments.  Xanax as needed for panic attacks.  Please come see me in 3 months.

## 2018-07-04 ENCOUNTER — Ambulatory Visit: Payer: 59 | Admitting: Family Medicine

## 2018-07-04 ENCOUNTER — Encounter: Payer: Self-pay | Admitting: Family Medicine

## 2018-07-04 VITALS — BP 138/84 | HR 77 | Temp 98.9°F | Ht 63.0 in | Wt 117.0 lb

## 2018-07-04 DIAGNOSIS — R3989 Other symptoms and signs involving the genitourinary system: Secondary | ICD-10-CM | POA: Diagnosis not present

## 2018-07-04 DIAGNOSIS — R399 Unspecified symptoms and signs involving the genitourinary system: Secondary | ICD-10-CM

## 2018-07-04 DIAGNOSIS — N3000 Acute cystitis without hematuria: Secondary | ICD-10-CM | POA: Diagnosis not present

## 2018-07-04 DIAGNOSIS — R350 Frequency of micturition: Secondary | ICD-10-CM | POA: Diagnosis not present

## 2018-07-04 DIAGNOSIS — B9620 Unspecified Escherichia coli [E. coli] as the cause of diseases classified elsewhere: Secondary | ICD-10-CM | POA: Insufficient documentation

## 2018-07-04 DIAGNOSIS — N390 Urinary tract infection, site not specified: Secondary | ICD-10-CM

## 2018-07-04 HISTORY — DX: Urinary tract infection, site not specified: B96.20

## 2018-07-04 HISTORY — DX: Unspecified symptoms and signs involving the genitourinary system: R39.9

## 2018-07-04 HISTORY — DX: Urinary tract infection, site not specified: N39.0

## 2018-07-04 LAB — POCT URINALYSIS DIPSTICK
Bilirubin, UA: NEGATIVE
Glucose, UA: NEGATIVE
KETONES UA: NEGATIVE
NITRITE UA: NEGATIVE
PH UA: 6.5 (ref 5.0–8.0)
PROTEIN UA: NEGATIVE
SPEC GRAV UA: 1.01 (ref 1.010–1.025)
UROBILINOGEN UA: 0.2 U/dL

## 2018-07-04 MED ORDER — CEPHALEXIN 500 MG PO CAPS: 500.0000 mg | ORAL_CAPSULE | Freq: Two times a day (BID) | ORAL | 0 refills | Status: DC

## 2018-07-04 NOTE — Patient Instructions (Signed)
Great to see you.  Please take keflex 500 mg twice daily x 7 days. Drink a lot of water  We are referring you to a urologist.

## 2018-07-04 NOTE — Progress Notes (Signed)
Subjective:   Patient ID: Krystal Grant, female    DOB: 1971-09-14, 47 y.o.   MRN: [id number]  Krystal BarbaraJennifer Bathgate is a pleasant 47 y.o. year old female who presents to clinic today with Urinary Frequency (Patient is here today C/O of urinary frequency x4d.  Has been running a low-grade fever.  Lower abd pain but denies any flank pain.  She does state that "I am now peeing out of a new pee hole and I don't know what is going on but I am peeing through my vagaina."  FYI: If an Rx is to be given today please print for pt to take with her.)  on 07/04/2018  HPI: Urinary frequency- progressing over past 4 days and feels she is running a low grade fever. +lower abdominal pain, denies flank pain. No dysuria. No hematuria.  Also, for past two months- ? New hole in her bladder/urthera-  "I am now peeing out of a new pee hole."  She feels she is peeing into her vagina- her tampons are full of urine for past two months.  +urinary incontinence with cough, sneezing. Current Outpatient Medications on File Prior to Visit  Medication Sig Dispense Refill  . ALPRAZolam (XANAX) 0.5 MG tablet Take 1 tablet (0.5 mg total) by mouth 2 (two) times daily as needed for anxiety. 60 tablet 2  . b complex vitamins capsule Take 1 capsule by mouth daily.    . cyclobenzaprine (FLEXERIL) 10 MG tablet Take 1 tablet by mouth 3 (three) times daily as needed.    . gabapentin (NEURONTIN) 300 MG capsule Take 1 capsule by mouth 4 (four) times daily.  2  . Multiple Vitamins-Minerals (MULTIVITAMIN WITH MINERALS) tablet Take 1 tablet by mouth daily.    Marland Kitchen. PINE BARK, PYCNOGENOL, PO Take 1 tablet by mouth daily.    . traMADol (ULTRAM) 50 MG tablet Take 50 mg by mouth every 6 (six) hours as needed for moderate pain.    . vitamin C (ASCORBIC ACID) 500 MG tablet Take 500 mg by mouth 2 (two) times daily.     No current facility-administered medications on file prior to visit.     Allergies  Allergen Reactions  . Cymbalta [Duloxetine  Hcl]     disorientation    Past Medical History:  Diagnosis Date  . Arthritis   . Carpal tunnel syndrome on right 03/15/2017  . Common migraine with intractable migraine 01/25/2018  . Depression   . History of chicken pox   . History of shingles 2008  . History of UTI     No past surgical history on file.  Family History  Problem Relation Age of Onset  . Migraines Mother   . Hypertension Mother   . Heart attack Mother   . Alcohol abuse Sister   . Depression Sister   . Asthma Son   . Depression Son   . Mental illness Son   . Learning disabilities Son   . Arthritis Maternal Grandmother   . Hearing loss Maternal Grandmother   . Heart attack Maternal Grandfather   . Cancer Paternal Grandmother   . Hearing loss Paternal Grandmother   . Heart attack Paternal Grandmother   . Heart attack Paternal Grandfather     Social History   Socioeconomic History  . Marital status: Married    Spouse name: Not on file  . Number of children: 3  . Years of education: 1012  . Highest education level: Not on file  Occupational History  . Occupation: [employer]   Social Needs  . Financial resource strain: Not on file  . Food insecurity:    Worry: Not on file    Inability: Not on file  . Transportation needs:    Medical: Not on file    Non-medical: Not on file  Tobacco Use  . Smoking status: Former Smoker    Types: Cigarettes    Last attempt to quit: 04/20/2018    Years since quitting: 0.2  . Smokeless tobacco: Never Used  Substance and Sexual Activity  . Alcohol use: No    Frequency: Never  . Drug use: No  . Sexual activity: Not on file  Lifestyle  . Physical activity:    Days per week: Not on file    Minutes per session: Not on file  . Stress: Not on file  Relationships  . Social connections:    Talks on phone: Not on file    Gets together: Not on file    Attends religious service: Not on file    Active member of club or organization: Not on file    Attends meetings of  clubs or organizations: Not on file    Relationship status: Not on file  . Intimate partner violence:    Fear of current or ex partner: Not on file    Emotionally abused: Not on file    Physically abused: Not on file    Forced sexual activity: Not on file  Other Topics Concern  . Not on file  Social History Narrative   Lives with husband and kids   Caffeine use: 2 per day   Right handed    The PMH, PSH, Social History, Family History, Medications, and allergies have been reviewed in [facility], and have been updated if relevant.   Review of Systems  Constitutional: Positive for fever.  Gastrointestinal: Negative.   Genitourinary: Positive for difficulty urinating, frequency and urgency. Negative for decreased urine volume, dyspareunia, dysuria, enuresis, flank pain, genital sores, hematuria, menstrual problem, pelvic pain, vaginal bleeding, vaginal discharge and vaginal pain.  Neurological: Negative.   Hematological: Negative.   All other systems reviewed and are negative.      Objective:    BP 138/84 (BP Location: Left Arm, Patient Position: Sitting, Cuff Size: Normal)   Pulse 77   Temp 98.9 F (37.2 C) (Oral)   Ht 5\' 3"  (1.6 m)   Wt 117 lb (53.1 kg)   LMP 06/11/2018   PF 100 L/min   BMI 20.73 kg/m    Physical Exam  Constitutional: She is oriented to person, place, and time. She appears well-developed and well-nourished. No distress.  HENT:  Head: Normocephalic and atraumatic.  Eyes: EOM are normal.  Neck: Normal range of motion.  Cardiovascular: Normal rate.  Pulmonary/Chest: Effort normal.  Abdominal: Soft. Bowel sounds are normal. She exhibits no distension. There is no tenderness.  Genitourinary:     Musculoskeletal: Normal range of motion. She exhibits no edema.  No flank tenderness  Neurological: She is alert and oriented to person, place, and time. No cranial nerve deficit.  Skin: Skin is warm and dry. She is not diaphoretic.  Psychiatric: She has a normal  mood and affect. Her behavior is normal. Judgment and thought content normal.  Nursing note and vitals reviewed.         Assessment & Plan:   Urinary frequency - Plan: POCT urinalysis dipstick No follow-ups on file.

## 2018-07-04 NOTE — Assessment & Plan Note (Addendum)
?  fistula. Refer to urology for evaluation. The patient indicates understanding of these issues and agrees with the plan.

## 2018-07-04 NOTE — Assessment & Plan Note (Signed)
Symptoms consistent with UTI- UA pos for LE and RBCs. Will treat with Keflex 500 mg twice daily x 7 days, send urine for cx. Advised pushing fluids.

## 2018-07-06 ENCOUNTER — Encounter: Payer: Self-pay | Admitting: Family Medicine

## 2018-07-06 LAB — URINE CULTURE
MICRO NUMBER:: 90934755
SPECIMEN QUALITY: ADEQUATE

## 2018-07-06 NOTE — Progress Notes (Signed)
[facility] [employer]/thx dmf 

## 2018-07-11 ENCOUNTER — Telehealth: Payer: Self-pay

## 2018-07-11 NOTE — Telephone Encounter (Signed)
PEC-I LMOVM for pt to call back and give you as many details as possible so I can get the information to Dr. Dayton MartesAron when she returns to the office on Monday/thx dmf  Copied from CRM [phone]. Topic: Referral - Request >> Jul 11, 2018 12:28 PM Debroah LoopLander, Lumin L wrote: Reason for CRM: Patient was dismmised from ENT that she was referred to and wants to be referred somewhere else. She says she did not have a good experience and would like a call back to discuss.

## 2018-07-13 ENCOUNTER — Ambulatory Visit (INDEPENDENT_AMBULATORY_CARE_PROVIDER_SITE_OTHER): Payer: 59 | Admitting: Psychology

## 2018-07-13 DIAGNOSIS — F410 Panic disorder [episodic paroxysmal anxiety] without agoraphobia: Secondary | ICD-10-CM

## 2018-08-27 DIAGNOSIS — N360 Urethral fistula: Secondary | ICD-10-CM

## 2018-08-27 DIAGNOSIS — N3946 Mixed incontinence: Secondary | ICD-10-CM

## 2018-08-27 HISTORY — DX: Mixed incontinence: N39.46

## 2018-08-27 HISTORY — DX: Urethral fistula: N36.0

## 2018-09-19 ENCOUNTER — Ambulatory Visit: Payer: 59 | Admitting: Family Medicine

## 2019-01-01 NOTE — Patient Instructions (Addendum)
Happy Birthday!  We are starting HCTZ 12.5 mg daily.  We also starting Keflex 500 mg twice daily x 7 days.   Please schedule a Physical with PAP in 2 weeks :)  Please blood cuff with you.   DASH Eating Plan DASH stands for "Dietary Approaches to Stop Hypertension." The DASH eating plan is a healthy eating plan that has been shown to reduce high blood pressure (hypertension). It may also reduce your risk for type 2 diabetes, heart disease, and stroke. The DASH eating plan may also help with weight loss. What are tips for following this plan?  General guidelines  Avoid eating more than 2,300 mg (milligrams) of salt (sodium) a day. If you have hypertension, you may need to reduce your sodium intake to 1,500 mg a day.  Limit alcohol intake to no more than 1 drink a day for nonpregnant women and 2 drinks a day for men. One drink equals 12 oz of beer, 5 oz of wine, or 1 oz of hard liquor.  Work with your health care provider to maintain a healthy body weight or to lose weight. Ask what an ideal weight is for you.  Get at least 30 minutes of exercise that causes your heart to beat faster (aerobic exercise) most days of the week. Activities may include walking, swimming, or biking.  Work with your health care provider or diet and nutrition specialist (dietitian) to adjust your eating plan to your individual calorie needs. Reading food labels   Check food labels for the amount of sodium per serving. Choose foods with less than 5 percent of the Daily Value of sodium. Generally, foods with less than 300 mg of sodium per serving fit into this eating plan.  To find whole grains, look for the word "whole" as the first word in the ingredient list. Shopping  Buy products labeled as "low-sodium" or "no salt added."  Buy fresh foods. Avoid canned foods and premade or frozen meals. Cooking  Avoid adding salt when cooking. Use salt-free seasonings or herbs instead of table salt or sea salt. Check  with your health care provider or pharmacist before using salt substitutes.  Do not fry foods. Cook foods using healthy methods such as baking, boiling, grilling, and broiling instead.  Cook with heart-healthy oils, such as olive, canola, soybean, or sunflower oil. Meal planning  Eat a balanced diet that includes: ? 5 or more servings of fruits and vegetables each day. At each meal, try to fill half of your plate with fruits and vegetables. ? Up to 6-8 servings of whole grains each day. ? Less than 6 oz of lean meat, poultry, or fish each day. A 3-oz serving of meat is about the same size as a deck of cards. One egg equals 1 oz. ? 2 servings of low-fat dairy each day. ? A serving of nuts, seeds, or beans 5 times each week. ? Heart-healthy fats. Healthy fats called Omega-3 fatty acids are found in foods such as flaxseeds and coldwater fish, like sardines, salmon, and mackerel.  Limit how much you eat of the following: ? Canned or prepackaged foods. ? Food that is high in trans fat, such as fried foods. ? Food that is high in saturated fat, such as fatty meat. ? Sweets, desserts, sugary drinks, and other foods with added sugar. ? Full-fat dairy products.  Do not salt foods before eating.  Try to eat at least 2 vegetarian meals each week.  Eat more home-cooked food and less restaurant,  buffet, and fast food.  When eating at a restaurant, ask that your food be prepared with less salt or no salt, if possible. What foods are recommended? The items listed may not be a complete list. Talk with your dietitian about what dietary choices are best for you. Grains Whole-grain or whole-wheat bread. Whole-grain or whole-wheat pasta. Brown rice. [place] Morrow. Bulgur. Whole-grain and low-sodium cereals. Pita bread. Low-fat, low-sodium crackers. Whole-wheat flour tortillas. Vegetables Fresh or frozen vegetables (raw, steamed, roasted, or grilled). Low-sodium or reduced-sodium tomato and vegetable  juice. Low-sodium or reduced-sodium tomato sauce and tomato paste. Low-sodium or reduced-sodium canned vegetables. Fruits All fresh, dried, or frozen fruit. Canned fruit in natural juice (without added sugar). Meat and other protein foods Skinless chicken or [place]. Ground chicken or [place]. Pork with fat trimmed off. Fish and seafood. Egg whites. Dried beans, peas, or lentils. Unsalted nuts, nut butters, and seeds. Unsalted canned beans. Lean cuts of beef with fat trimmed off. Low-sodium, lean deli meat. Dairy Low-fat (1%) or fat-free (skim) milk. Fat-free, low-fat, or reduced-fat cheeses. Nonfat, low-sodium ricotta or cottage cheese. Low-fat or nonfat yogurt. Low-fat, low-sodium cheese. Fats and oils Soft margarine without trans fats. Vegetable oil. Low-fat, reduced-fat, or light mayonnaise and salad dressings (reduced-sodium). Canola, safflower, olive, soybean, and sunflower oils. Avocado. Seasoning and other foods Herbs. Spices. Seasoning mixes without salt. Unsalted popcorn and pretzels. Fat-free sweets. What foods are not recommended? The items listed may not be a complete list. Talk with your dietitian about what dietary choices are best for you. Grains Baked goods made with fat, such as croissants, muffins, or some breads. Dry pasta or rice meal packs. Vegetables Creamed or fried vegetables. Vegetables in a cheese sauce. Regular canned vegetables (not low-sodium or reduced-sodium). Regular canned tomato sauce and paste (not low-sodium or reduced-sodium). Regular tomato and vegetable juice (not low-sodium or reduced-sodium). Angie Fava. Olives. Fruits Canned fruit in a light or heavy syrup. Fried fruit. Fruit in cream or butter sauce. Meat and other protein foods Fatty cuts of meat. Ribs. Fried meat. Berniece Salines. Sausage. Bologna and other processed lunch meats. Salami. Fatback. Hotdogs. Bratwurst. Salted nuts and seeds. Canned beans with added salt. Canned or smoked fish. Whole eggs or egg yolks.  Chicken or [place] with skin. Dairy Whole or 2% milk, cream, and half-and-half. Whole or full-fat cream cheese. Whole-fat or sweetened yogurt. Full-fat cheese. Nondairy creamers. Whipped toppings. Processed cheese and cheese spreads. Fats and oils Butter. Stick margarine. Lard. Shortening. Ghee. Bacon fat. Tropical oils, such as coconut, palm kernel, or palm oil. Seasoning and other foods Salted popcorn and pretzels. Onion salt, garlic salt, seasoned salt, table salt, and sea salt. Worcestershire sauce. Tartar sauce. Barbecue sauce. Teriyaki sauce. Soy sauce, including reduced-sodium. Steak sauce. Canned and packaged gravies. Fish sauce. Oyster sauce. Cocktail sauce. Horseradish that you find on the shelf. Ketchup. Mustard. Meat flavorings and tenderizers. Bouillon cubes. Hot sauce and Tabasco sauce. Premade or packaged marinades. Premade or packaged taco seasonings. Relishes. Regular salad dressings. Where to find more information:  National Heart, Lung, and [place]: https://wilson-eaton.com/  American Heart Association: www.heart.org Summary  The DASH eating plan is a healthy eating plan that has been shown to reduce high blood pressure (hypertension). It may also reduce your risk for type 2 diabetes, heart disease, and stroke.  With the DASH eating plan, you should limit salt (sodium) intake to 2,300 mg a day. If you have hypertension, you may need to reduce your sodium intake to 1,500 mg a day.  When  on the DASH eating plan, aim to eat more fresh fruits and vegetables, whole grains, lean proteins, low-fat dairy, and heart-healthy fats.  Work with your health care provider or diet and nutrition specialist (dietitian) to adjust your eating plan to your individual calorie needs. This information is not intended to replace advice given to you by your health care provider. Make sure you discuss any questions you have with your health care provider. Document Released: 11/03/2011 Document Revised:  11/07/2016 Document Reviewed: 11/07/2016 Elsevier Interactive Patient Education  2019 [employer].

## 2019-01-02 ENCOUNTER — Ambulatory Visit: Payer: 59 | Admitting: Family Medicine

## 2019-01-02 ENCOUNTER — Encounter: Payer: Self-pay | Admitting: Family Medicine

## 2019-01-02 VITALS — BP 146/90 | HR 98 | Temp 98.4°F | Ht 63.0 in | Wt 122.0 lb

## 2019-01-02 DIAGNOSIS — R030 Elevated blood-pressure reading, without diagnosis of hypertension: Secondary | ICD-10-CM

## 2019-01-02 DIAGNOSIS — I10 Essential (primary) hypertension: Secondary | ICD-10-CM | POA: Insufficient documentation

## 2019-01-02 DIAGNOSIS — R319 Hematuria, unspecified: Secondary | ICD-10-CM

## 2019-01-02 DIAGNOSIS — R300 Dysuria: Secondary | ICD-10-CM

## 2019-01-02 DIAGNOSIS — N390 Urinary tract infection, site not specified: Secondary | ICD-10-CM | POA: Diagnosis not present

## 2019-01-02 LAB — CBC WITH DIFFERENTIAL/PLATELET
Basophils Absolute: 0 10*3/uL (ref 0.0–0.1)
Basophils Relative: 1 % (ref 0.0–3.0)
Eosinophils Absolute: 0.2 10*3/uL (ref 0.0–0.7)
Eosinophils Relative: 3.6 % (ref 0.0–5.0)
HCT: 36.4 % (ref 36.0–46.0)
Hemoglobin: 12.1 g/dL (ref 12.0–15.0)
Lymphocytes Relative: 26.4 % (ref 12.0–46.0)
Lymphs Abs: 1.2 10*3/uL (ref 0.7–4.0)
MCHC: 33.4 g/dL (ref 30.0–36.0)
MCV: 82.5 fl (ref 78.0–100.0)
Monocytes Absolute: 0.3 10*3/uL (ref 0.1–1.0)
Monocytes Relative: 5.8 % (ref 3.0–12.0)
NEUTROS PCT: 63.2 % (ref 43.0–77.0)
Neutro Abs: 2.9 10*3/uL (ref 1.4–7.7)
Platelets: 276 10*3/uL (ref 150.0–400.0)
RBC: 4.41 Mil/uL (ref 3.87–5.11)
RDW: 14.7 % (ref 11.5–15.5)
WBC: 4.6 10*3/uL (ref 4.0–10.5)

## 2019-01-02 LAB — POCT URINALYSIS DIPSTICK
Bilirubin, UA: NEGATIVE
Glucose, UA: NEGATIVE
KETONES UA: NEGATIVE
Nitrite, UA: POSITIVE
Protein, UA: NEGATIVE
SPEC GRAV UA: 1.01 (ref 1.010–1.025)
Urobilinogen, UA: 0.2 E.U./dL
pH, UA: 6.5 (ref 5.0–8.0)

## 2019-01-02 LAB — FOLLICLE STIMULATING HORMONE: FSH: 90.5 m[IU]/mL

## 2019-01-02 LAB — COMPREHENSIVE METABOLIC PANEL
ALT: 19 U/L (ref 0–35)
AST: 21 U/L (ref 0–37)
Albumin: 4.5 g/dL (ref 3.5–5.2)
Alkaline Phosphatase: 46 U/L (ref 39–117)
BUN: 11 mg/dL (ref 6–23)
CHLORIDE: 105 meq/L (ref 96–112)
CO2: 28 mEq/L (ref 19–32)
Calcium: 9.6 mg/dL (ref 8.4–10.5)
Creatinine, Ser: 0.88 mg/dL (ref 0.40–1.20)
GFR: 69.19 mL/min (ref >60.00)
Glucose, Bld: 84 mg/dL (ref 70–99)
POTASSIUM: 4 meq/L (ref 3.5–5.1)
Sodium: 142 mEq/L (ref 135–145)
Total Bilirubin: 0.5 mg/dL (ref 0.2–1.2)
Total Protein: 7 g/dL (ref 6.0–8.3)

## 2019-01-02 LAB — FERRITIN: Ferritin: 9.6 ng/mL — ABNORMAL LOW (ref 10.0–291.0)

## 2019-01-02 LAB — TSH: TSH: 1.4 u[IU]/mL (ref 0.35–4.50)

## 2019-01-02 LAB — LUTEINIZING HORMONE: LH: 58.52 m[IU]/mL

## 2019-01-02 MED ORDER — HYDROCHLOROTHIAZIDE 12.5 MG PO CAPS: 12.5000 mg | ORAL_CAPSULE | Freq: Every day | ORAL | 3 refills | Status: DC

## 2019-01-02 MED ORDER — CEPHALEXIN 500 MG PO CAPS: 500.0000 mg | ORAL_CAPSULE | Freq: Two times a day (BID) | ORAL | 0 refills | Status: DC

## 2019-01-02 NOTE — Assessment & Plan Note (Signed)
UA positive- treat with keflex 500 mg twice daily x 7 days. Given complexity of her urinary history, send urine for cx. Call or return to clinic prn if these symptoms worsen or fail to improve as anticipated. Push fluids, AZO as needed for dysuria. Call or return to clinic prn if these symptoms worsen or fail to improve as anticipated. The patient indicates understanding of these issues and agrees with the plan.

## 2019-01-02 NOTE — Assessment & Plan Note (Signed)
>  25 minutes spent in face to face time with patient, >50% spent in counselling or coordination of care Multiple elevated readings and she is symptomatic.  She is concerned because she will be under stress over the next couple of weeks. Check labs today. Start low dose hctz 12.5 mg daily- discussed signs and symptoms of orthostasis. Keep monitoring BP. Discussed DASH diet. Handout given. She will follow up with me in 2 weeks and bring her cuff at that time. The patient indicates understanding of these issues and agrees with the plan.

## 2019-01-02 NOTE — Progress Notes (Signed)
Subjective:   Patient ID: Krystal Grant, female    DOB: [birth year], 48 y.o.   MRN: [id number]  Krystal Grant is a pleasant 48 y.o. year old female who presents to clinic today with Hypertension (Patient is here today C/O elevated BP. She saw Pain Mngmnt end of Nov and BP 160/118.  She then started monitoring it and it has stayed up. Last hs after relaxing she got 170/117.) and Dysuria (Patient is also C/O urinary pain and urgency x1wk,  Tx with Cranberry but not helpful)  on 01/02/2019  HPI:  ?HTN- She went to pain management in 09/2018 and BP was 160/118.  She started monitored at home and it has been consistently elevated with home cuff and [facility].  No h/o HTN.  She does have family h/o HTN and premature CAD.  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   BP Readings from Last 3 Encounters:  01/02/19 (!) 146/90  07/04/18 138/84  06/20/18 122/82   Lab Results  Component Value Date   CREATININE 0.72 01/21/2018   Lab Results  Component Value Date   TSH 1.00 01/18/2018   ?UTI- Has had urinary urgency and dysuria for a week.  Has been taking Cranberry but that is not helping.  No fevers, no nausea, vomiting or back pain.  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 Urine cx on 07/04/18 grew pansensitive E coli.  Was treated with Keflex at that time.      Current Outpatient Medications on File Prior to Visit  Medication Sig Dispense Refill  . ALPRAZolam (XANAX) 0.5 MG tablet Take 1 tablet (0.5 mg total) by mouth 2 (two) times daily as needed for anxiety. 60 tablet 2  . b complex vitamins capsule Take 1 capsule by mouth daily.    . cyclobenzaprine (FLEXERIL) 10 MG tablet Take 1 tablet by mouth 3 (three) times daily as needed.    . gabapentin (NEURONTIN) 300 MG capsule Take 1 capsule by mouth 4 (four) times daily.  2  . Multiple Vitamins-Minerals (MULTIVITAMIN WITH MINERALS) tablet Take 1 tablet by  mouth daily.    Marland Kitchen. PINE BARK, PYCNOGENOL, PO Take 1 tablet by mouth daily.    . traMADol (ULTRAM) 50 MG tablet Take 50 mg by mouth every 6 (six) hours as needed for moderate pain.    . vitamin C (ASCORBIC ACID) 500 MG tablet Take 500 mg by mouth 2 (two) times daily.     No current facility-administered medications on file prior to visit.     Allergies  Allergen Reactions  . Cymbalta [Duloxetine Hcl]     disorientation    Past Medical History:  Diagnosis Date  . Arthritis   . Carpal tunnel syndrome on right 03/15/2017  . Common migraine with intractable migraine 01/25/2018  . Depression   . Fibromyalgia   . History of chicken pox   . History of shingles 2008  . History of UTI   . Mixed incontinence 08/27/2018  . Seronegative rheumatoid arthritis (HCC)   . 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 CARPAL TUNNEL RELEASE  06/28/2017  . ELBOW ARTHROSCOPY  06/28/2017    Family History  Problem Relation Age of Onset  . Migraines Mother   . Hypertension Mother   . Heart attack Mother   . Nephrolithiasis Mother   . Alcohol abuse Sister   .  Depression Sister   . Asthma Son   . Depression Son   . Mental illness Son   . Learning disabilities Son   . Arthritis Maternal Grandmother   . Hearing loss Maternal Grandmother   . Heart attack Maternal Grandfather   . Cancer Paternal Grandmother        Breast  . Hearing loss Paternal Grandmother   . Heart attack Paternal Grandmother   . Heart attack Paternal Grandfather   . Uterine cancer Maternal Aunt     Social History   Socioeconomic History  . Marital status: Married    Spouse name: Not on file  . Number of children: 3  . Years of education: 70  . Highest education level: Not on file  Occupational History  . Occupation: [employer]   Social Needs  . Financial resource strain: Not on file  . Food insecurity:    Worry: Not on file    Inability: Not on file  . Transportation  needs:    Medical: Not on file    Non-medical: Not on file  Tobacco Use  . Smoking status: Former Smoker    Types: Cigarettes    Last attempt to quit: 04/20/2018    Years since quitting: 0.7  . Smokeless tobacco: Never Used  Substance and Sexual Activity  . Alcohol use: No    Frequency: Never  . Drug use: No  . Sexual activity: Not on file  Lifestyle  . Physical activity:    Days per week: Not on file    Minutes per session: Not on file  . Stress: Not on file  Relationships  . Social connections:    Talks on phone: Not on file    Gets together: Not on file    Attends religious service: Not on file    Active member of club or organization: Not on file    Attends meetings of clubs or organizations: Not on file    Relationship status: Not on file  . Intimate partner violence:    Fear of current or ex partner: Not on file    Emotionally abused: Not on file    Physically abused: Not on file    Forced sexual activity: Not on file  Other Topics Concern  . Not on file  Social History Narrative   Lives with husband and kids   Caffeine use: 2 per day   Right handed    The PMH, PSH, Social History, Family History, Medications, and allergies have been reviewed in [facility], and have been updated if relevant.   Review of Systems  Constitutional: Negative.   Eyes: Negative for visual disturbance.  Respiratory: Negative.   Cardiovascular: Negative.   Gastrointestinal: Negative.   Genitourinary: Positive for dysuria, frequency and urgency. Negative for decreased urine volume, difficulty urinating, dyspareunia, enuresis, flank pain, genital sores, hematuria, menstrual problem, pelvic pain, vaginal bleeding, vaginal discharge and vaginal pain.  Neurological: Positive for headaches. Negative for dizziness, tremors, seizures, syncope, facial asymmetry, speech difficulty, weakness, light-headedness and numbness.  All other systems reviewed and are negative.      Objective:    BP (!)  146/90 (BP Location: Left Arm, Cuff Size: Normal)   Pulse 98   Temp 98.4 F (36.9 C) (Oral)   Ht 5\' 3"  (1.6 m)   Wt 122 lb (55.3 kg)   LMP 12/04/2018   SpO2 98%   BMI 21.61 kg/m    Physical Exam Vitals signs and nursing note reviewed.  Constitutional:  General: She is not in acute distress.    Appearance: Normal appearance. She is normal weight. She is not ill-appearing.  HENT:     Head: Normocephalic and atraumatic.     Nose: Nose normal.     Mouth/Throat:     Mouth: Mucous membranes are moist.  Eyes:     Pupils: Pupils are equal, round, and reactive to light.  Neck:     Musculoskeletal: Normal range of motion.  Cardiovascular:     Rate and Rhythm: Regular rhythm.     Pulses: Normal pulses.  Pulmonary:     Effort: Pulmonary effort is normal.     Breath sounds: Normal breath sounds.  Abdominal:     General: Abdomen is flat. Bowel sounds are normal. There is no distension.     Palpations: Abdomen is soft.     Tenderness: There is no abdominal tenderness. There is no guarding.  Musculoskeletal: Normal range of motion.        General: No swelling.  Skin:    General: Skin is warm and dry.  Neurological:     General: No focal deficit present.     Mental Status: She is alert and oriented to person, place, and time.  Psychiatric:        Mood and Affect: Mood normal.        Behavior: Behavior normal.        Thought Content: Thought content normal.        Judgment: Judgment normal.           Assessment & Plan:   Dysuria - Plan: POCT urinalysis dipstick  Elevated blood pressure reading No follow-ups on file.

## 2019-01-04 LAB — URINE CULTURE
MICRO NUMBER:: 154592
SPECIMEN QUALITY:: ADEQUATE

## 2019-01-08 ENCOUNTER — Telehealth: Payer: Self-pay

## 2019-01-08 MED ORDER — FERROUS SULFATE 325 (65 FE) MG PO TABS: 325.0000 mg | ORAL_TABLET | Freq: Every day | ORAL | 3 refills | Status: DC

## 2019-01-08 NOTE — Telephone Encounter (Signed)
eRx sent

## 2019-01-08 NOTE — Telephone Encounter (Signed)
TA-Pt would like for you to send in an Rx for Fe for her/plz advise/thx dmf

## 2019-01-08 NOTE — Telephone Encounter (Signed)
-----   Message from Dianne Dunalia M Aron, MD sent at 01/07/2019  2:34 PM EST ----- Would she like to start OTC iron or for me to call in rx?

## 2019-01-15 ENCOUNTER — Other Ambulatory Visit (HOSPITAL_COMMUNITY)
Admission: RE | Admit: 2019-01-15 | Discharge: 2019-01-15 | Disposition: A | Discharge status: Home / Self Care | Payer: 59 | Source: Ambulatory Visit | Attending: Family Medicine | Admitting: Family Medicine

## 2019-01-15 ENCOUNTER — Encounter: Payer: Self-pay | Admitting: Family Medicine

## 2019-01-15 ENCOUNTER — Ambulatory Visit: Payer: 59 | Admitting: Family Medicine

## 2019-01-15 VITALS — BP 126/89 | HR 84 | Temp 98.5°F | Ht 63.0 in | Wt 126.8 lb

## 2019-01-15 DIAGNOSIS — B9620 Unspecified Escherichia coli [E. coli] as the cause of diseases classified elsewhere: Secondary | ICD-10-CM | POA: Diagnosis not present

## 2019-01-15 DIAGNOSIS — I10 Essential (primary) hypertension: Secondary | ICD-10-CM

## 2019-01-15 DIAGNOSIS — Z0001 Encounter for general adult medical examination with abnormal findings: Secondary | ICD-10-CM | POA: Insufficient documentation

## 2019-01-15 DIAGNOSIS — Z124 Encounter for screening for malignant neoplasm of cervix: Secondary | ICD-10-CM | POA: Diagnosis present

## 2019-01-15 DIAGNOSIS — Z0000 Encounter for general adult medical examination without abnormal findings: Secondary | ICD-10-CM

## 2019-01-15 DIAGNOSIS — N390 Urinary tract infection, site not specified: Secondary | ICD-10-CM

## 2019-01-15 DIAGNOSIS — Z1239 Encounter for other screening for malignant neoplasm of breast: Secondary | ICD-10-CM | POA: Diagnosis not present

## 2019-01-15 LAB — URINALYSIS, MICROSCOPIC ONLY

## 2019-01-15 LAB — COMPREHENSIVE METABOLIC PANEL
ALK PHOS: 58 U/L (ref 39–117)
ALT: 16 U/L (ref 0–35)
AST: 19 U/L (ref 0–37)
Albumin: 4.1 g/dL (ref 3.5–5.2)
BUN: 19 mg/dL (ref 6–23)
CO2: 28 mEq/L (ref 19–32)
Calcium: 9.2 mg/dL (ref 8.4–10.5)
Chloride: 103 mEq/L (ref 96–112)
Creatinine, Ser: 0.96 mg/dL (ref 0.40–1.20)
GFR: 62.57 mL/min (ref >60.00)
Glucose, Bld: 86 mg/dL (ref 70–99)
POTASSIUM: 3.9 meq/L (ref 3.5–5.1)
Sodium: 139 mEq/L (ref 135–145)
TOTAL PROTEIN: 6.5 g/dL (ref 6.0–8.3)
Total Bilirubin: 0.2 mg/dL (ref 0.2–1.2)

## 2019-01-15 LAB — LIPID PANEL
Cholesterol: 168 mg/dL (ref 0–200)
HDL: 63.2 mg/dL (ref >39.00)
LDL Cholesterol: 87 mg/dL (ref 0–99)
NonHDL: 104.52
Total CHOL/HDL Ratio: 3
Triglycerides: 90 mg/dL (ref 0.0–149.0)
VLDL: 18 mg/dL (ref 0.0–40.0)

## 2019-01-15 LAB — TSH: TSH: 1.4 u[IU]/mL (ref 0.35–4.50)

## 2019-01-15 MED ORDER — ALPRAZOLAM 0.5 MG PO TABS: 0.5000 mg | ORAL_TABLET | Freq: Two times a day (BID) | ORAL | 2 refills | Status: DC | PRN

## 2019-01-15 NOTE — Progress Notes (Signed)
Subjective:   Patient ID: Krystal Grant, female    DOB: Jul 08, 1971, 48 y.o.   MRN: [id number]  Amaziah Ghosh is a pleasant 48 y.o. year old female who presents to clinic today with Follow-up (Patient is here today for a 2-week-F/U.  On 2.5.20 she was started on HCTZ 12.5mg .  She was advised on dietary changes and to monitor BP and RTN in 2 weeks. She was started on an Rx for Fe as well post lab results.  UTI with E. Coli confirmed with C&S sensitive to Keflex she was placed on. She finished the abx and is asymptomatic but urine is still a bit dark. ) and Annual Exam (Patient is also here today for a CPE with PAP.  She declines STD screening.)  on 01/15/2019  HPI:  Here for CPX and 2 week follow up.  Health Maintenance  Topic Date Due  . PAP SMEAR-Modifier  01/18/1994  . TETANUS/TDAP  06/20/2028  . INFLUENZA VACCINE  Completed  . HIV Screening  Discontinued   Due for mammogram.  Last saw her on 01/02/19. Note reviewed.  New diagnosis of HTN- BP had been trending up at other offices and at home since 09/2018.  She has a family h/o HTN and premature CAD.  Also under a lot of stress as this is a busy time at work for her.  She had been having headaches as well.  Given that she was symptomatic and other factors listed, we started her on low dose HCTZ- 12.5 mg daily and advised DASH diet.  BP Readings from Last 3 Encounters:  01/15/19 126/89  01/02/19 (!) 146/90  07/04/18 138/84    UTI- one week of dysuria and urgency with pos UA for blood, nitrites and LE. Treated with Keflex 500 mg twice daily x 7 days and sent urine for cx. Cx grew E coli sensitive to Keflex.  She finished course of Keflex as prescribed. Dysuria and urgency have resolved.  She still feels urine is still a bit on the darker side.  Recent Results (from the past 2160 hour(s))  POCT urinalysis dipstick     Status: Abnormal   Collection Time: 01/02/19  8:26 AM  Result Value Ref Range   Color, UA Dark Yellow    Clarity, UA Cloudy    Glucose, UA Negative Negative   Bilirubin, UA Negative    Ketones, UA Negative    Spec Grav, UA 1.010 1.010 - 1.025   Blood, UA 1+    pH, UA 6.5 5.0 - 8.0   Protein, UA Negative Negative   Urobilinogen, UA 0.2 0.2 or 1.0 E.U./dL   Nitrite, UA Positivie    Leukocytes, UA Moderate (2+) (A) Negative    Comment: Sent for C&S   Appearance     Odor    Urine Culture     Status: Abnormal   Collection Time: 01/02/19  8:39 AM  Result Value Ref Range   MICRO NUMBER: [id number]    SPECIMEN QUALITY: Adequate    Sample Source URINE    STATUS: FINAL    ISOLATE 1: Escherichia coli (A)     Comment: Greater than 100,000 CFU/mL of Escherichia coli      Susceptibility   Escherichia coli - URINE CULTURE, REFLEX    AMOX/CLAVULANIC <=2 Sensitive     AMPICILLIN 4 Sensitive     AMPICILLIN/SULBACTAM <=2 Sensitive     CEFAZOLIN* <=4 Not Reportable      * For infections other than uncomplicated UTIcaused by E. coli,  K. pneumoniae or P. mirabilis:Cefazolin is resistant if MIC > or = 8 mcg/mL.(Distinguishing susceptible versus intermediatefor isolates with MIC < or = 4 mcg/mL requiresadditional testing.)For uncomplicated UTI caused by E. coli,K. pneumoniae or P. mirabilis: Cefazolin issusceptible if MIC <32 mcg/mL and predictssusceptible to the oral agents cefaclor, cefdinir,cefpodoxime, cefprozil, cefuroxime, cephalexinand loracarbef.    CEFEPIME <=1 Sensitive     CEFTRIAXONE <=1 Sensitive     CIPROFLOXACIN <=0.25 Sensitive     LEVOFLOXACIN <=0.12 Sensitive     ERTAPENEM <=0.5 Sensitive     GENTAMICIN <=1 Sensitive     IMIPENEM <=0.25 Sensitive     NITROFURANTOIN <=16 Sensitive     PIP/TAZO <=4 Sensitive     TOBRAMYCIN <=1 Sensitive     TRIMETH/SULFA* <=20 Sensitive      * For infections other than uncomplicated UTIcaused by E. coli, K. pneumoniae or P. mirabilis:Cefazolin is resistant if MIC > or = 8 mcg/mL.(Distinguishing susceptible versus intermediatefor isolates with MIC < or = 4  mcg/mL requiresadditional testing.)For uncomplicated UTI caused by E. coli,K. pneumoniae or P. mirabilis: Cefazolin issusceptible if MIC <32 mcg/mL and predictssusceptible to the oral agents cefaclor, cefdinir,cefpodoxime, cefprozil, cefuroxime, cephalexinand loracarbef.Legend:S = Susceptible  I = IntermediateR = Resistant  NS = Not susceptible* = Not tested  NR = Not reported**NN = See antimicrobic comments  TSH     Status: None   Collection Time: 01/02/19  8:39 AM  Result Value Ref Range   TSH 1.40 0.35 - 4.50 uIU/mL  LH     Status: None   Collection Time: 01/02/19  8:39 AM  Result Value Ref Range   LH 58.52 mIU/mL    Comment: Female Reference Range:20-70 yrs     1.5-9.3 mIU/mL>70 yrs       3.1-35.6 mIU/mLFemale Reference Range:Follicular Phase     1.9-12.5 mIU/mLMidcycle             8.7-76.3 mIU/mLLuteal Phase         0.5-16.9 mIU/mL  Post Menopausal      15.9-54.0  mIU/mLPregnant             <1.5 mIU/mLContraceptives       0.7-5.6 mIU/mL   FSH     Status: None   Collection Time: 01/02/19  8:39 AM  Result Value Ref Range   FSH 90.5 mIU/ML    Comment: Female Reference Range:  1.4-18.1 mIU/mLFemale Reference Range:Follicular Phase          2.5-10.2 mIU/mLMidCycle Peak          3.4-33.4 mIU/mLLuteal Phase          1.5-9.1 mIU/mLPost Menopausal     23.0-116.3 mIU/mLPregnant          <0.3 mIU/mL  CBC with Differential/Platelet     Status: None   Collection Time: 01/02/19  8:39 AM  Result Value Ref Range   WBC 4.6 4.0 - 10.5 K/uL   RBC 4.41 3.87 - 5.11 Mil/uL   Hemoglobin 12.1 12.0 - 15.0 g/dL   HCT 96.0 45.4 - 09.8 %   MCV 82.5 78.0 - 100.0 fl   MCHC 33.4 30.0 - 36.0 g/dL   RDW 11.9 14.7 - 82.9 %   Platelets 276.0 150.0 - 400.0 K/uL   Neutrophils Relative % 63.2 43.0 - 77.0 %   Lymphocytes Relative 26.4 12.0 - 46.0 %   Monocytes Relative 5.8 3.0 - 12.0 %   Eosinophils Relative 3.6 0.0 - 5.0 %   Basophils Relative 1.0 0.0 - 3.0 %  Neutro Abs 2.9 1.4 - 7.7 K/uL   Lymphs Abs 1.2 0.7 - 4.0  K/uL   Monocytes Absolute 0.3 0.1 - 1.0 K/uL   Eosinophils Absolute 0.2 0.0 - 0.7 K/uL   Basophils Absolute 0.0 0.0 - 0.1 K/uL  Comprehensive metabolic panel     Status: None   Collection Time: 01/02/19  8:39 AM  Result Value Ref Range   Sodium 142 135 - 145 mEq/L   Potassium 4.0 3.5 - 5.1 mEq/L   Chloride 105 96 - 112 mEq/L   CO2 28 19 - 32 mEq/L   Glucose, Bld 84 70 - 99 mg/dL   BUN 11 6 - 23 mg/dL   Creatinine, Ser 1.610.88 0.40 - 1.20 mg/dL   Total Bilirubin 0.5 0.2 - 1.2 mg/dL   Alkaline Phosphatase 46 39 - 117 U/L   AST 21 0 - 37 U/L   ALT 19 0 - 35 U/L   Total Protein 7.0 6.0 - 8.3 g/dL   Albumin 4.5 3.5 - 5.2 g/dL   Calcium 9.6 8.4 - 09.610.5 mg/dL   GFR 04.5469.19 >09.81>60.00 mL/min  Ferritin     Status: Abnormal   Collection Time: 01/02/19  8:39 AM  Result Value Ref Range   Ferritin 9.6 (L) 10.0 - 291.0 ng/mL    Current Outpatient Medications on File Prior to Visit  Medication Sig Dispense Refill  . ALPRAZolam (XANAX) 0.5 MG tablet Take 1 tablet (0.5 mg total) by mouth 2 (two) times daily as needed for anxiety. 60 tablet 2  . b complex vitamins capsule Take 1 capsule by mouth daily.    . cyclobenzaprine (FLEXERIL) 10 MG tablet Take 1 tablet by mouth 3 (three) times daily as needed.    . ferrous sulfate 325 (65 FE) MG tablet Take 1 tablet (325 mg total) by mouth daily with breakfast. 30 tablet 3  . gabapentin (NEURONTIN) 300 MG capsule Take 1 capsule by mouth 4 (four) times daily.  2  . hydrochlorothiazide (MICROZIDE) 12.5 MG capsule Take 1 capsule (12.5 mg total) by mouth daily. 30 capsule 3  . Multiple Vitamins-Minerals (MULTIVITAMIN WITH MINERALS) tablet Take 1 tablet by mouth daily.    Marland Kitchen. PINE BARK, PYCNOGENOL, PO Take 1 tablet by mouth daily.    . traMADol (ULTRAM) 50 MG tablet Take 50 mg by mouth every 6 (six) hours as needed for moderate pain.    . vitamin C (ASCORBIC ACID) 500 MG tablet Take 500 mg by mouth 2 (two) times daily.     No current facility-administered medications  on file prior to visit.     Allergies  Allergen Reactions  . Cymbalta [Duloxetine Hcl]     disorientation    Past Medical History:  Diagnosis Date  . Arthritis   . Carpal tunnel syndrome on right 03/15/2017  . Common migraine with intractable migraine 01/25/2018  . Depression   . Fibromyalgia   . History of chicken pox   . History of shingles 2008  . History of UTI   . Mixed incontinence 08/27/2018  . Seronegative rheumatoid arthritis (HCC)   . 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 CARPAL TUNNEL RELEASE  06/28/2017  . ELBOW ARTHROSCOPY  06/28/2017    Family History  Problem Relation Age of Onset  . Migraines Mother   . Hypertension Mother   . Heart attack Mother   . Nephrolithiasis Mother   . Alcohol abuse Sister   . Depression Sister   .  Asthma Son   . Depression Son   . Mental illness Son   . Learning disabilities Son   . Arthritis Maternal Grandmother   . Hearing loss Maternal Grandmother   . Heart attack Maternal Grandfather   . Cancer Paternal Grandmother        Breast  . Hearing loss Paternal Grandmother   . Heart attack Paternal Grandmother   . Heart attack Paternal Grandfather   . Uterine cancer Maternal Aunt     Social History   Socioeconomic History  . Marital status: Married    Spouse name: Not on file  . Number of children: 3  . Years of education: 86  . Highest education level: Not on file  Occupational History  . Occupation: [employer]   Social Needs  . Financial resource strain: Not on file  . Food insecurity:    Worry: Not on file    Inability: Not on file  . Transportation needs:    Medical: Not on file    Non-medical: Not on file  Tobacco Use  . Smoking status: Former Smoker    Types: Cigarettes    Last attempt to quit: 04/20/2018    Years since quitting: 0.7  . Smokeless tobacco: Never Used  Substance and Sexual Activity  . Alcohol use: No    Frequency: Never  . Drug use: No    . Sexual activity: Not on file  Lifestyle  . Physical activity:    Days per week: Not on file    Minutes per session: Not on file  . Stress: Not on file  Relationships  . Social connections:    Talks on phone: Not on file    Gets together: Not on file    Attends religious service: Not on file    Active member of club or organization: Not on file    Attends meetings of clubs or organizations: Not on file    Relationship status: Not on file  . Intimate partner violence:    Fear of current or ex partner: Not on file    Emotionally abused: Not on file    Physically abused: Not on file    Forced sexual activity: Not on file  Other Topics Concern  . Not on file  Social History Narrative   Lives with husband and kids   Caffeine use: 2 per day   Right handed    The PMH, PSH, Social History, Family History, Medications, and allergies have been reviewed in [facility], and have been updated if relevant.   Review of Systems  Constitutional: Negative.   HENT: Negative.   Eyes: Negative.   Respiratory: Negative.   Cardiovascular: Negative.   Gastrointestinal: Negative.   Endocrine: Negative.   Genitourinary: Negative.   Musculoskeletal: Negative.   Skin: Negative.   Allergic/Immunologic: Negative.   Neurological: Negative.   Hematological: Negative.   Psychiatric/Behavioral: Negative.   All other systems reviewed and are negative.      Objective:    BP 126/89 (BP Location: Left Arm, Cuff Size: Normal) Comment (BP Location): Pt BP Cuff  Pulse 84   Temp 98.5 F (36.9 C) (Oral)   Ht 5\' 3"  (1.6 m)   Wt 126 lb 12.8 oz (57.5 kg)   LMP 12/04/2018   SpO2 98%   BMI 22.46 kg/m    Physical Exam Vitals signs and nursing note reviewed. Exam conducted with a chaperone present.  Constitutional:      General: She is not in acute distress.  Appearance: Normal appearance. She is not ill-appearing.  HENT:     Head: Normocephalic and atraumatic.     Nose: Nose normal.      Mouth/Throat:     Mouth: Mucous membranes are moist.  Eyes:     Extraocular Movements: Extraocular movements intact.  Neck:     Musculoskeletal: Normal range of motion.  Cardiovascular:     Rate and Rhythm: Normal rate and regular rhythm.  Pulmonary:     Effort: Pulmonary effort is normal.     Breath sounds: Normal breath sounds.  Genitourinary:    General: Normal vulva.     Vagina: Normal.     Cervix: Normal.     Uterus: Normal.      Adnexa: Right adnexa normal and left adnexa normal.     Rectum: Normal.  Musculoskeletal: Normal range of motion.        General: No swelling.  Skin:    General: Skin is warm and dry.  Neurological:     General: No focal deficit present.     Mental Status: She is alert and oriented to person, place, and time.  Psychiatric:        Mood and Affect: Mood normal.        Behavior: Behavior normal.        Thought Content: Thought content normal.        Judgment: Judgment normal.           Assessment & Plan:   Well woman exam without gynecological exam - Plan: Cytology - PAP( [place])  E. coli UTI (urinary tract infection)  Essential hypertension - Plan: Urine Microscopic Only, Lipid panel, Comprehensive metabolic panel, TSH  Screening for cervical cancer - Plan: Cytology - PAP( [place]) No follow-ups on file.

## 2019-01-15 NOTE — Patient Instructions (Addendum)
Great to see you. I will call you with your lab results from today and you can view them online.   Happy Birthday!  Please [facility] to schedule your mammogram.

## 2019-01-15 NOTE — Assessment & Plan Note (Signed)
Symptoms resolved s/p treatment with Keflex- cx confirmed E coli UTI was sensitive to Keflex. Urine still a bit dark- ? Due to diuretic.  Advised to push fluids. Urine micro today. The patient indicates understanding of these issues and agrees with the plan.

## 2019-01-15 NOTE — Assessment & Plan Note (Signed)
Reviewed preventive care protocols, scheduled due services, and updated immunizations Discussed nutrition, exercise, diet, and healthy lifestyle.  Pap smear done today. Mammogram ordered- pt to call to schedule.

## 2019-01-15 NOTE — Assessment & Plan Note (Addendum)
Well controlled on low dose HCTZ. No changes made today. She will continue to monitor BP at home. Labs today. Call or return to clinic prn if these symptoms worsen or fail to improve as anticipated.

## 2019-01-16 LAB — CYTOLOGY - PAP
Diagnosis: NEGATIVE
HPV: NOT DETECTED

## 2019-04-06 ENCOUNTER — Other Ambulatory Visit: Payer: Self-pay | Admitting: Family Medicine

## 2019-05-10 ENCOUNTER — Other Ambulatory Visit: Payer: Self-pay

## 2019-05-10 MED ORDER — HYDROCHLOROTHIAZIDE 12.5 MG PO CAPS: 12.5000 mg | ORAL_CAPSULE | Freq: Every day | ORAL | 3 refills | Status: DC

## 2019-06-12 ENCOUNTER — Other Ambulatory Visit: Payer: Self-pay | Admitting: Family Medicine

## 2019-06-13 NOTE — Telephone Encounter (Signed)
LOV 01/15/2019. No F/U appt scheduled. Please advise, Thanks.

## 2019-06-13 NOTE — Telephone Encounter (Signed)
TA-Last refill 5.25.20 per [place] PMP pt is compliant/no red flags/Not due for OV till October/plz advise/thx dmf

## 2019-07-01 ENCOUNTER — Other Ambulatory Visit: Payer: Self-pay | Admitting: Family Medicine

## 2019-12-31 ENCOUNTER — Other Ambulatory Visit: Payer: Self-pay

## 2019-12-31 MED ORDER — ALPRAZOLAM 0.5 MG PO TABS: ORAL_TABLET | ORAL | 5 refills | Status: DC

## 2019-12-31 NOTE — Telephone Encounter (Signed)
TA-Plz see refill req/per [place] PMP pt is compliant without red flags/I have pended Rx for Alprazolam for your approval/thx dmf

## 2020-02-26 ENCOUNTER — Other Ambulatory Visit: Payer: Self-pay

## 2020-02-26 ENCOUNTER — Encounter: Payer: Self-pay | Admitting: Primary Care

## 2020-02-26 ENCOUNTER — Ambulatory Visit (INDEPENDENT_AMBULATORY_CARE_PROVIDER_SITE_OTHER): Payer: 59 | Admitting: Primary Care

## 2020-02-26 VITALS — BP 140/92 | HR 82 | Temp 97.2°F | Ht 62.5 in | Wt 122.8 lb

## 2020-02-26 DIAGNOSIS — J3489 Other specified disorders of nose and nasal sinuses: Secondary | ICD-10-CM

## 2020-02-26 DIAGNOSIS — F411 Generalized anxiety disorder: Secondary | ICD-10-CM | POA: Diagnosis not present

## 2020-02-26 DIAGNOSIS — M1990 Unspecified osteoarthritis, unspecified site: Secondary | ICD-10-CM

## 2020-02-26 DIAGNOSIS — M2550 Pain in unspecified joint: Secondary | ICD-10-CM

## 2020-02-26 DIAGNOSIS — Z1231 Encounter for screening mammogram for malignant neoplasm of breast: Secondary | ICD-10-CM

## 2020-02-26 DIAGNOSIS — I10 Essential (primary) hypertension: Secondary | ICD-10-CM | POA: Diagnosis not present

## 2020-02-26 HISTORY — DX: Other specified disorders of nose and nasal sinuses: J34.89

## 2020-02-26 MED ORDER — MUPIROCIN 2 % EX OINT: 1.0000 "application " | TOPICAL_OINTMENT | Freq: Two times a day (BID) | CUTANEOUS | 0 refills | Status: DC

## 2020-02-26 MED ORDER — HYDROXYZINE HCL 10 MG PO TABS: 10.0000 mg | ORAL_TABLET | Freq: Two times a day (BID) | ORAL | 0 refills | Status: DC | PRN

## 2020-02-26 MED ORDER — LISINOPRIL-HYDROCHLOROTHIAZIDE 10-12.5 MG PO TABS: 1.0000 | ORAL_TABLET | Freq: Every day | ORAL | 0 refills | Status: DC

## 2020-02-26 NOTE — Assessment & Plan Note (Signed)
Above goal in the office today, also with home readings for the last one year. Change regimen to lisinopril-HCTZ 10/12.5 mg.  Follow up in 2-3 weeks for BP check and BMP.

## 2020-02-26 NOTE — Assessment & Plan Note (Signed)
To multiple locations, following with pain management who prescribes Tramadol, gabapentin, Flexeril.  Referral placed to rheumatology for second opinion as requested.

## 2020-02-26 NOTE — Assessment & Plan Note (Signed)
Chronic, intermittent.  Discussed that I do not prescribe Xanax due to risk for dependence and side effects.  Stop Xanax. Rx for hydroxyzine 10 mg sent to pharmacy to use PRN. She will update.

## 2020-02-26 NOTE — Assessment & Plan Note (Signed)
Appears secondary to excess moisture from wearing masks daily. Rx for mupirocin sent to pharmacy. She will update.

## 2020-02-26 NOTE — Patient Instructions (Signed)
Call the [facility] to schedule your mammogram.   You will be contacted regarding your referral to rheumatology.  Please let us know if you have not been contacted within two weeks.   Apply the mupirocin ointment twice daily for about one week, then as needed for nasal sore.  Try the hydroxyzine 10 mg tablets as needed for anxiety. You may take 1-2 tablets twice daily as needed.  Stop taking HCTZ 21.5 mg for blood pressure. Start taking lisinopril-hydrochlorothiazide 10-12.5 mg for blood pressure.  Continue to monitor your blood pressure daily.  Please schedule a follow up visit for 2-3 weeks for blood pressure.  It was a pleasure meeting you!

## 2020-02-26 NOTE — Progress Notes (Signed)
Subjective:    Patient ID: Krystal Grant, female    DOB: [birth year], 49 y.o.   MRN: [id number]  HPI  This visit occurred during the SARS-CoV-2 public health emergency.  Safety protocols were in place, including screening questions prior to the visit, additional usage of staff PPE, and extensive cleaning of exam room while observing appropriate contact time as indicated for disinfecting solutions.   Krystal Grant is a 49 year old female who presents today to transfer care from Dr. Deborra Medina.  1) GAD: Diagnosed years ago. Has struggled intermittently. Currently managed on Xanax 0.5 mg BID for which she takes 0.25 mg BID intermittently. Will go weeks without taking.   2) Osteoarthritis: Currently managed on Tramadol 50 mg TID-QID, gabapentin 300 mg TID, cyclobenzaprine 10 mg PRN. She is following with pain management in [facility] through [facility] every three months.  Diagnosed with "rheumatoid arthritis" with negative labs when living in [place]. most of her pain is in her hips, hands, elbows, feet, ankles, knees. When moving to [place] she saw rheumatology who didn't find evidence of RA in her joints, was told that she didn't have RA. She was then diagnosed with "fibromyalgia" and sent to PT. She doesn't believe she has fibromyalgia given her daily/chronic symptoms. She is now just following with pain management every 3 months.   Family history of RA in maternal uncle, maternal grandmother, maternal aunt. She would like a second opinion with another rheumatology practice.  3) Essential Hypertension: Currently managed on HCTZ 12.5 mg daily. She is checking her BP at home which is running 140's/90's over the last one year. She does have headaches, some blurred vision. Denies dizziness.   BP Readings from Last 3 Encounters:  02/26/20 (!) 140/92  01/15/19 126/89  01/02/19 (!) 146/90     Review of Systems  Eyes: Positive for visual disturbance.  Respiratory: Negative for  shortness of breath.   Cardiovascular: Negative for chest pain.  Musculoskeletal: Positive for arthralgias.  Neurological: Positive for headaches. Negative for dizziness.       Past Medical History:  Diagnosis Date  . Arthritis   . Carpal tunnel syndrome on right 03/15/2017  . Common migraine with intractable migraine 01/25/2018  . Depression   . Fibromyalgia   . History of chicken pox   . History of shingles 2008  . History of UTI   . Mixed incontinence 08/27/2018  . Seronegative rheumatoid arthritis ([place])   . 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 Marital status: Married    Spouse name: Not on file  . Number of children: 3  . Years of education: 72  . Highest education level: Not on file  Occupational History  . Occupation: [employer]   Tobacco Use  . Smoking status: Former Smoker    Types: Cigarettes    Quit date: 04/20/2018    Years since quitting: 1.8  . Smokeless tobacco: Never Used  Substance and Sexual Activity  . Alcohol use: No  . Drug use: No  . Sexual activity: Not on file  Other Topics Concern  . Not on file  Social History Narrative   Lives with husband and kids   Caffeine use: 2 per day   Right handed    Social Determinants of Health   Financial Resource Strain:   . Difficulty of Paying Living Expenses:   Food Insecurity:   . Worried About Charity fundraiser in the Last Year:   .  Ran Out of Food in the Last Year:   Transportation Needs:   . Freight forwarder (Medical):   Marland Kitchen Lack of Transportation (Non-Medical):   Physical Activity:   . Days of Exercise per Week:   . Minutes of Exercise per Session:   Stress:   . Feeling of Stress :   Social Connections:   . Frequency of Communication with Friends and Family:   . Frequency of Social Gatherings with Friends and Family:   . Attends Religious Services:   . Active Member of Clubs or Organizations:   . Attends Banker Meetings:   Marland Kitchen  Marital Status:   Intimate Partner Violence:   . Fear of Current or Ex-Partner:   . Emotionally Abused:   Marland Kitchen Physically Abused:   . Sexually Abused:     Past Surgical History:  Procedure Laterality Date  . CARPAL TUNNEL RELEASE  06/28/2017  . ELBOW ARTHROSCOPY  06/28/2017    Family History  Problem Relation Age of Onset  . Migraines Mother   . Hypertension Mother   . Heart attack Mother   . Nephrolithiasis Mother   . Alcohol abuse Sister   . Depression Sister   . Asthma Son   . Depression Son   . Mental illness Son   . Learning disabilities Son   . Arthritis Maternal Grandmother   . Hearing loss Maternal Grandmother   . Heart attack Maternal Grandfather   . Cancer Paternal Grandmother        Breast  . Hearing loss Paternal Grandmother   . Heart attack Paternal Grandmother   . Heart attack Paternal Grandfather   . Uterine cancer Maternal Aunt     Allergies  Allergen Reactions  . Cymbalta [Duloxetine Hcl]     disorientation    Current Outpatient Medications on File Prior to Visit  Medication Sig Dispense Refill  . b complex vitamins capsule Take 1 capsule by mouth daily.    . cyclobenzaprine (FLEXERIL) 10 MG tablet Take 1 tablet by mouth 3 (three) times daily as needed.    . ferrous sulfate 325 (65 FE) MG tablet TAKE 1 TABLET BY MOUTH EVERY DAY WITH BREAKFAST 90 tablet 3  . gabapentin (NEURONTIN) 300 MG capsule Take 1 capsule by mouth 4 (four) times daily.  2  . hydrochlorothiazide (MICROZIDE) 12.5 MG capsule Take 1 capsule (12.5 mg total) by mouth daily. 90 capsule 3  . Multiple Vitamins-Minerals (MULTIVITAMIN WITH MINERALS) tablet Take 1 tablet by mouth daily.    Marland Kitchen PINE BARK, PYCNOGENOL, PO Take 1 tablet by mouth daily.    . traMADol (ULTRAM) 50 MG tablet Take 50 mg by mouth every 6 (six) hours as needed for moderate pain.    . vitamin C (ASCORBIC ACID) 500 MG tablet Take 500 mg by mouth 2 (two) times daily.     No current facility-administered medications on  file prior to visit.    BP (!) 140/92   Pulse 82   Temp (!) 97.2 F (36.2 C) (Temporal)   Ht 5' 2.5" (1.588 m)   Wt 122 lb 12 oz (55.7 kg)   LMP 02/23/2020   SpO2 98%   BMI 22.09 kg/m    Objective:   Physical Exam  Constitutional: She appears well-nourished.  Cardiovascular: Normal rate and regular rhythm.  Respiratory: Effort normal and breath sounds normal.  Musculoskeletal:     Cervical back: Neck supple.  Skin: Skin is warm and dry.  Psychiatric: She has a normal mood and affect.  Assessment & Plan:

## 2020-03-02 ENCOUNTER — Other Ambulatory Visit: Payer: Self-pay | Admitting: Primary Care

## 2020-03-02 ENCOUNTER — Telehealth: Payer: Self-pay | Admitting: Primary Care

## 2020-03-02 DIAGNOSIS — N644 Mastodynia: Secondary | ICD-10-CM

## 2020-03-02 NOTE — Telephone Encounter (Signed)
Pt is needing a referral/order for Diagnostic Bilateral Mammogram with Left and right ultrasound.   Please send to [facility]

## 2020-03-03 NOTE — Telephone Encounter (Signed)
Message left for patient to return my call.  

## 2020-03-03 NOTE — Telephone Encounter (Signed)
Orders were already in the system.

## 2020-03-04 ENCOUNTER — Other Ambulatory Visit: Payer: Self-pay | Admitting: Primary Care

## 2020-03-04 DIAGNOSIS — F411 Generalized anxiety disorder: Secondary | ICD-10-CM

## 2020-03-04 NOTE — Telephone Encounter (Signed)
Spoken and notified patient of Kate Clark's comments. Patient verbalized understanding.  

## 2020-03-11 ENCOUNTER — Other Ambulatory Visit: Payer: Self-pay

## 2020-03-11 ENCOUNTER — Encounter: Payer: Self-pay | Admitting: Primary Care

## 2020-03-11 ENCOUNTER — Ambulatory Visit (INDEPENDENT_AMBULATORY_CARE_PROVIDER_SITE_OTHER): Payer: 59 | Admitting: Primary Care

## 2020-03-11 VITALS — BP 118/74 | HR 88 | Temp 97.0°F | Ht 62.5 in | Wt 122.5 lb

## 2020-03-11 DIAGNOSIS — I10 Essential (primary) hypertension: Secondary | ICD-10-CM | POA: Diagnosis not present

## 2020-03-11 DIAGNOSIS — R8290 Unspecified abnormal findings in urine: Secondary | ICD-10-CM | POA: Diagnosis not present

## 2020-03-11 LAB — POC URINALSYSI DIPSTICK (AUTOMATED)
Bilirubin, UA: NEGATIVE
Glucose, UA: NEGATIVE
Ketones, UA: NEGATIVE
Leukocytes, UA: NEGATIVE
Nitrite, UA: NEGATIVE
Protein, UA: NEGATIVE
Spec Grav, UA: 1.01 (ref 1.010–1.025)
Urobilinogen, UA: 0.2 E.U./dL
pH, UA: 7 (ref 5.0–8.0)

## 2020-03-11 LAB — COMPREHENSIVE METABOLIC PANEL
ALT: 17 U/L (ref 0–35)
AST: 18 U/L (ref 0–37)
Albumin: 4.3 g/dL (ref 3.5–5.2)
Alkaline Phosphatase: 48 U/L (ref 39–117)
BUN: 13 mg/dL (ref 6–23)
CO2: 30 mEq/L (ref 19–32)
Calcium: 9.5 mg/dL (ref 8.4–10.5)
Chloride: 101 mEq/L (ref 96–112)
Creatinine, Ser: 0.95 mg/dL (ref 0.40–1.20)
GFR: 63.02 mL/min (ref >60.00)
Glucose, Bld: 90 mg/dL (ref 70–99)
Potassium: 4.9 mEq/L (ref 3.5–5.1)
Sodium: 137 mEq/L (ref 135–145)
Total Bilirubin: 0.3 mg/dL (ref 0.2–1.2)
Total Protein: 6.5 g/dL (ref 6.0–8.3)

## 2020-03-11 LAB — CBC
HCT: 35.6 % — ABNORMAL LOW (ref 36.0–46.0)
Hemoglobin: 11.9 g/dL — ABNORMAL LOW (ref 12.0–15.0)
MCHC: 33.4 g/dL (ref 30.0–36.0)
MCV: 84.9 fl (ref 78.0–100.0)
Platelets: 346 10*3/uL (ref 150.0–400.0)
RBC: 4.19 Mil/uL (ref 3.87–5.11)
RDW: 14.2 % (ref 11.5–15.5)
WBC: 4.7 10*3/uL (ref 4.0–10.5)

## 2020-03-11 LAB — LIPID PANEL
Cholesterol: 207 mg/dL — ABNORMAL HIGH (ref 0–200)
HDL: 60 mg/dL (ref >39.00)
LDL Cholesterol: 137 mg/dL — ABNORMAL HIGH (ref 0–99)
NonHDL: 146.91
Total CHOL/HDL Ratio: 3
Triglycerides: 51 mg/dL (ref 0.0–149.0)
VLDL: 10.2 mg/dL (ref 0.0–40.0)

## 2020-03-11 MED ORDER — LISINOPRIL-HYDROCHLOROTHIAZIDE 10-12.5 MG PO TABS: 1.0000 | ORAL_TABLET | Freq: Every day | ORAL | 3 refills | Status: DC

## 2020-03-11 NOTE — Progress Notes (Signed)
Subjective:    Patient ID: Krystal Grant, female    DOB: 12/17/1970, 49 y.o.   MRN: [id number]  HPI  This visit occurred during the SARS-CoV-2 public health emergency.  Safety protocols were in place, including screening questions prior to the visit, additional usage of staff PPE, and extensive cleaning of exam room while observing appropriate contact time as indicated for disinfecting solutions.   Krystal Grant is a 49 year old female with a history of GAD, osteoarthritis, hypertension who presents today for follow up of hypertension. She also reports urinary tract symptoms.   She was last evaluated on 02/26/20 as a TOC patient from Dr. Dayton Martes. Blood pressure was noted to be above goal, also home readings, despite HCTZ 12.5 mg.  Given elevated readings we changed her regimen to lisinopril-HCTZ 10-12.5 mg.  Since her last visit she's checking her BP at home which is running 110's/70's. She denies cough, dizziness, headaches.   She also may think she may have a UTI. 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 Symptoms include suprapubic discomfort, mild odor to urine. She denies dysuria, hematuria. She typically gets UTI's 2-3 times annually.   BP Readings from Last 3 Encounters:  03/11/20 118/74  02/26/20 (!) 140/92  01/15/19 126/89     Review of Systems  Eyes: Negative for visual disturbance.  Respiratory: Negative for cough.   Genitourinary: Negative for dysuria, flank pain, hematuria and vaginal discharge.       See HPI  Neurological: Negative for dizziness and headaches.       Past Medical History:  Diagnosis Date  . Arthritis   . Carpal tunnel syndrome on right 03/15/2017  . Common migraine with intractable migraine 01/25/2018  . Depression   . E. coli UTI (urinary tract infection) 07/04/2018  . Fibromyalgia   . History of chicken pox   . History of shingles 2008  . History of UTI   . Mixed incontinence  08/27/2018  . Seronegative rheumatoid arthritis ([facility])   . Symptom involving bladder 07/04/2018  . 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 Marital status: Married    Spouse name: Not on file  . Number of children: 3  . Years of education: 44  . Highest education level: Not on file  Occupational History  . Occupation: [employer]   Tobacco Use  . Smoking status: Former Smoker    Types: Cigarettes    Quit date: 04/20/2018    Years since quitting: 1.8  . Smokeless tobacco: Never Used  Substance and Sexual Activity  . Alcohol use: No  . Drug use: No  . Sexual activity: Not on file  Other Topics Concern  . Not on file  Social History Narrative   Lives with husband and kids   Caffeine use: 2 per day   Right handed    Social Determinants of Health   Financial Resource Strain:   . Difficulty of Paying Living Expenses:   Food Insecurity:   . Worried About Programme researcher, broadcasting/film/video in the Last Year:   . Barista in the Last Year:   Transportation Needs:   . Freight forwarder (Medical):   Marland Kitchen Lack of Transportation (Non-Medical):   Physical Activity:   . Days of Exercise per Week:   . Minutes of Exercise per Session:   Stress:   . Feeling of Stress :   Social  Connections:   . Frequency of Communication with Friends and Family:   . Frequency of Social Gatherings with Friends and Family:   . Attends Religious Services:   . Active Member of Clubs or Organizations:   . Attends Archivist Meetings:   Marland Kitchen Marital Status:   Intimate Partner Violence:   . Fear of Current or Ex-Partner:   . Emotionally Abused:   Marland Kitchen Physically Abused:   . Sexually Abused:     Past Surgical History:  Procedure Laterality Date  . CARPAL TUNNEL RELEASE  06/28/2017  . ELBOW ARTHROSCOPY  06/28/2017    Family History  Problem Relation Age of Onset  . Migraines Mother   . Hypertension Mother   . Heart attack Mother   . Nephrolithiasis Mother   .  Alcohol abuse Sister   . Depression Sister   . Asthma Son   . Depression Son   . Mental illness Son   . Learning disabilities Son   . Arthritis Maternal Grandmother   . Hearing loss Maternal Grandmother   . Heart attack Maternal Grandfather   . Cancer Paternal Grandmother        Breast  . Hearing loss Paternal Grandmother   . Heart attack Paternal Grandmother   . Heart attack Paternal Grandfather   . Uterine cancer Maternal Aunt     Allergies  Allergen Reactions  . Cymbalta [Duloxetine Hcl]     disorientation    Current Outpatient Medications on File Prior to Visit  Medication Sig Dispense Refill  . b complex vitamins capsule Take 1 capsule by mouth daily.    . cyclobenzaprine (FLEXERIL) 10 MG tablet Take 1 tablet by mouth 3 (three) times daily as needed.    . ferrous sulfate 325 (65 FE) MG tablet TAKE 1 TABLET BY MOUTH EVERY DAY WITH BREAKFAST 90 tablet 3  . gabapentin (NEURONTIN) 300 MG capsule Take 1 capsule by mouth 4 (four) times daily.  2  . hydrOXYzine (ATARAX/VISTARIL) 10 MG tablet Take 1-2 tablets (10-20 mg total) by mouth 2 (two) times daily as needed for anxiety. 30 tablet 0  . lisinopril-hydrochlorothiazide (ZESTORETIC) 10-12.5 MG tablet Take 1 tablet by mouth daily. For blood pressure. 30 tablet 0  . Multiple Vitamins-Minerals (MULTIVITAMIN WITH MINERALS) tablet Take 1 tablet by mouth daily.    . mupirocin ointment (BACTROBAN) 2 % Place 1 application into the nose 2 (two) times daily. 22 g 0  . PINE BARK, PYCNOGENOL, PO Take 1 tablet by mouth daily.    . traMADol (ULTRAM) 50 MG tablet Take 50 mg by mouth every 6 (six) hours as needed for moderate pain.    . vitamin C (ASCORBIC ACID) 500 MG tablet Take 500 mg by mouth 2 (two) times daily.     No current facility-administered medications on file prior to visit.    BP 118/74   Pulse 88   Temp (!) 97 F (36.1 C) (Temporal)   Ht 5' 2.5" (1.588 m)   Wt 122 lb 8 oz (55.6 kg)   LMP 02/23/2020   SpO2 98%   BMI  22.05 kg/m    Objective:   Physical Exam  Constitutional: She appears well-nourished.  Cardiovascular: Normal rate and regular rhythm.  Respiratory: Effort normal and breath sounds normal.  Musculoskeletal:     Cervical back: Neck supple.  Skin: Skin is warm and dry.  Psychiatric: She has a normal mood and affect.           Assessment & Plan:

## 2020-03-11 NOTE — Assessment & Plan Note (Signed)
Improved and well controlled with addition of lisinopril 10 mg to HCTZ 12.5 mg. Continue same. BMP pending.

## 2020-03-11 NOTE — Assessment & Plan Note (Signed)
History of recurrent UTI, typically occurring 2-3 times annually.   UA today with trace blood, otherwise negative. Check urine culture.

## 2020-03-11 NOTE — Patient Instructions (Signed)
Stop by the lab prior to leaving today. I will notify you of your results once received.   We will be in touch once we receive your urine culture test.  Continue lisinopril-HCTZ 10-12.5 mg for blood pressure.  It was a pleasure to see you today!

## 2020-03-12 LAB — URINE CULTURE
MICRO NUMBER:: 10362885
Result:: NO GROWTH
SPECIMEN QUALITY:: ADEQUATE

## 2020-03-15 ENCOUNTER — Other Ambulatory Visit: Payer: Self-pay | Admitting: Primary Care

## 2020-03-15 DIAGNOSIS — F411 Generalized anxiety disorder: Secondary | ICD-10-CM

## 2020-03-16 NOTE — Telephone Encounter (Signed)
Ok to change? Last prescribed on 02/26/2020 . Last appointment on 03/11/2020. No future appointment

## 2020-03-17 NOTE — Telephone Encounter (Signed)
RX changed 

## 2020-03-24 ENCOUNTER — Ambulatory Visit
Admission: RE | Admit: 2020-03-24 | Discharge: 2020-03-24 | Disposition: A | Discharge status: Home / Self Care | Payer: 59 | Source: Ambulatory Visit | Attending: Primary Care | Admitting: Primary Care

## 2020-03-24 DIAGNOSIS — N644 Mastodynia: Secondary | ICD-10-CM

## 2020-04-15 ENCOUNTER — Telehealth: Payer: Self-pay

## 2020-04-15 ENCOUNTER — Telehealth (INDEPENDENT_AMBULATORY_CARE_PROVIDER_SITE_OTHER): Payer: 59 | Admitting: Family Medicine

## 2020-04-15 ENCOUNTER — Encounter: Payer: Self-pay | Admitting: Family Medicine

## 2020-04-15 ENCOUNTER — Other Ambulatory Visit: Payer: 59

## 2020-04-15 ENCOUNTER — Other Ambulatory Visit: Payer: Self-pay | Admitting: Primary Care

## 2020-04-15 VITALS — BP 112/73 | Temp 98.7°F | Ht 62.5 in | Wt 122.0 lb

## 2020-04-15 DIAGNOSIS — R300 Dysuria: Secondary | ICD-10-CM | POA: Diagnosis not present

## 2020-04-15 DIAGNOSIS — N3000 Acute cystitis without hematuria: Secondary | ICD-10-CM

## 2020-04-15 DIAGNOSIS — R3915 Urgency of urination: Secondary | ICD-10-CM | POA: Diagnosis not present

## 2020-04-15 DIAGNOSIS — F411 Generalized anxiety disorder: Secondary | ICD-10-CM

## 2020-04-15 HISTORY — DX: Acute cystitis without hematuria: N30.00

## 2020-04-15 LAB — POC URINALSYSI DIPSTICK (AUTOMATED)
Bilirubin, UA: NEGATIVE
Glucose, UA: NEGATIVE
Ketones, UA: NEGATIVE
Nitrite, UA: NEGATIVE
Protein, UA: POSITIVE — AB
Spec Grav, UA: 1.01 (ref 1.010–1.025)
Urobilinogen, UA: 0.2 E.U./dL
pH, UA: 7.5 (ref 5.0–8.0)

## 2020-04-15 MED ORDER — SULFAMETHOXAZOLE-TRIMETHOPRIM 800-160 MG PO TABS: 1.0000 | ORAL_TABLET | Freq: Two times a day (BID) | ORAL | 0 refills | Status: DC

## 2020-04-15 NOTE — Progress Notes (Signed)
Virtual Visit via Video Note  I connected with Krystal Grant on 04/15/20 at  2:00 PM EDT by a video enabled telemedicine application and verified that I am speaking with the correct person using two identifiers.  Location: Patient: home Provider: office    I discussed the limitations of evaluation and management by telemedicine and the availability of in person appointments. The patient expressed understanding and agreed to proceed.  Parties involved in encounter  Patient: Krystal Grant   Provider:  Roxy Manns MD    History of Present Illness: 49 yo pt of [facility] presents with urinary symptoms   She has utis often Has a fistula (sees urology)   Has frequency Dysuria /esp at end of urination  Urine is cloudy  No blood   Urgency  Some discomfort over her bladder  No flank pain   Some chills - temp was normal  Some nausea (yesterday)      Ua: Results for orders placed or performed in visit on 04/15/20  POCT Urinalysis Dipstick (Automated)  Result Value Ref Range   Color, UA yellow    Clarity, UA cloudy    Glucose, UA Negative Negative   Bilirubin, UA neg    Ketones, UA neg    Spec Grav, UA 1.010 1.010 - 1.025   Blood, UA large    pH, UA 7.5 5.0 - 8.0   Protein, UA Positive (A) Negative   Urobilinogen, UA 0.2 0.2 or 1.0 E.U./dL   Nitrite, UA neg    Leukocytes, UA Large (3+) (A) Negative       Patient Active Problem List   Diagnosis Date Noted  . Acute cystitis 04/15/2020  . Foul smelling urine 03/11/2020  . Nasal sore 02/26/2020  . Well woman exam without gynecological exam 01/15/2019  . HTN (hypertension) 01/02/2019  . History of migraine headaches 06/20/2018  . Vertigo 06/20/2018  . GAD (generalized anxiety disorder) 06/20/2018  . Arthritis 06/20/2018   Past Medical History:  Diagnosis Date  . Arthritis   . Carpal tunnel syndrome on right 03/15/2017  . Common migraine with intractable migraine 01/25/2018  . Depression   . E. coli UTI (urinary  tract infection) 07/04/2018  . Fibromyalgia   . History of chicken pox   . History of shingles 2008  . History of UTI   . Mixed incontinence 08/27/2018  . Seronegative rheumatoid arthritis ([facility])   . Symptom involving bladder 07/04/2018  . 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 CARPAL TUNNEL RELEASE  06/28/2017  . ELBOW ARTHROSCOPY  06/28/2017   Social History   Tobacco Use  . Smoking status: Former Smoker    Types: Cigarettes    Quit date: 04/20/2018    Years since quitting: 1.9  . Smokeless tobacco: Never Used  Substance Use Topics  . Alcohol use: No  . Drug use: No   Family History  Problem Relation Age of Onset  . Migraines Mother   . Hypertension Mother   . Heart attack Mother   . Nephrolithiasis Mother   . Alcohol abuse Sister   . Depression Sister   . Asthma Son   . Depression Son   . Mental illness Son   . Learning disabilities Son   . Arthritis Maternal Grandmother   . Hearing loss Maternal Grandmother   . Heart attack Maternal Grandfather   . Cancer Paternal Grandmother        Breast  . Hearing loss Paternal Grandmother   .  Heart attack Paternal Grandmother   . Breast cancer Paternal Grandmother   . Heart attack Paternal Grandfather   . Uterine cancer Maternal Aunt    Allergies  Allergen Reactions  . Cymbalta [Duloxetine Hcl]     disorientation   Current Outpatient Medications on File Prior to Visit  Medication Sig Dispense Refill  . b complex vitamins capsule Take 1 capsule by mouth daily.    Marland Kitchen CRANBERRY PO Take by mouth.    . cyclobenzaprine (FLEXERIL) 10 MG tablet Take 1 tablet by mouth 3 (three) times daily as needed.    . gabapentin (NEURONTIN) 300 MG capsule Take 1 capsule by mouth 4 (four) times daily.  2  . hydrOXYzine (ATARAX/VISTARIL) 10 MG tablet TAKE 1-2 TABLETS (10-20 MG TOTAL) BY MOUTH 2 (TWO) TIMES DAILY AS NEEDED FOR ANXIETY. 180 tablet 0  . lisinopril-hydrochlorothiazide (ZESTORETIC) 10-12.5  MG tablet Take 1 tablet by mouth daily. For blood pressure. 90 tablet 3  . Multiple Vitamins-Minerals (MULTIVITAMIN WITH MINERALS) tablet Take 1 tablet by mouth daily.    . mupirocin ointment (BACTROBAN) 2 % Place 1 application into the nose 2 (two) times daily. 22 g 0  . traMADol (ULTRAM) 50 MG tablet Take 50 mg by mouth every 6 (six) hours as needed for moderate pain.    . vitamin C (ASCORBIC ACID) 500 MG tablet Take 500 mg by mouth 2 (two) times daily.     No current facility-administered medications on file prior to visit.   Review of Systems  Constitutional: Positive for chills. Negative for fever and malaise/fatigue.  HENT: Negative for congestion, ear pain, sinus pain and sore throat.   Eyes: Negative for blurred vision, discharge and redness.  Respiratory: Negative for cough, shortness of breath and stridor.   Cardiovascular: Negative for chest pain, palpitations and leg swelling.  Gastrointestinal: Positive for nausea. Negative for abdominal pain, diarrhea and vomiting.  Genitourinary: Positive for dysuria, frequency and urgency. Negative for flank pain and hematuria.  Musculoskeletal: Negative for myalgias.  Skin: Negative for rash.  Neurological: Negative for dizziness and headaches.    Observations/Objective: Patient appears well, in no distress Weight is baseline  No facial swelling or asymmetry Normal voice-not hoarse and no slurred speech No obvious tremor or mobility impairment Moving neck and UEs normally Able to hear the call well  No cough or shortness of breath during interview  Talkative and mentally sharp with no cognitive changes No skin changes on face or neck , no rash or pallor Affect is normal    Assessment and Plan: Problem List Items Addressed This Visit      Genitourinary   Acute cystitis    In pt with known fistula that causes freq utis  Encouraged fluids  Px bactrim ds to take bid for 5 d  inst to alert if any worsening or no improvement in  several days (esp if flank pain or other symptoms)  Culture pending        Other Visit Diagnoses    Urinary urgency    -  Primary   Relevant Orders   POCT Urinalysis Dipstick (Automated) (Completed)   Urine Culture   Dysuria       Relevant Orders   POCT Urinalysis Dipstick (Automated) (Completed)   Urine Culture       Follow Up Instructions: Drink lots of water  Take the bactrim ds as directed  If worse or no improvement in several days let us know (especially if any fever or flank pain)  We will contact you when the urine culture returns)    I discussed the assessment and treatment plan with the patient. The patient was provided an opportunity to ask questions and all were answered. The patient agreed with the plan and demonstrated an understanding of the instructions.   The patient was advised to call back or seek an in-person evaluation if the symptoms worsen or if the condition fails to improve as anticipated.    Loura Pardon, MD

## 2020-04-15 NOTE — Telephone Encounter (Signed)
Patient contacted the office and stated that she woke up this morning with sx of a UTI. She states her urine is very cloudy, & she is having urinary urgency, burning, and lower abdominal pressure.  She states she has a fistula, and so it is common for her to get UTI's, and she is wondering if Jae Dire would be willing to send in an abx for her? She states she has no one to watch her children this week, so she denied an in office appt. Jae Dire, is it ok to schedule a virtual for this?

## 2020-04-15 NOTE — Telephone Encounter (Signed)
Aware- I will see her then  Krystal Grant-she is dropping off urine sample Thanks

## 2020-04-15 NOTE — Patient Instructions (Signed)
Drink lots of water  Take the bactrim ds as directed  If worse or no improvement in several days let us know (especially if any fever or flank pain)   We will contact you when the urine culture returns)

## 2020-04-15 NOTE — Telephone Encounter (Signed)
Patient had left a voicemail on triage line. Called patient back and virtual visit was scheduled today with Dr. Milinda Antis at 2:00 pm.. Patient to drop off urine prior to virtual visit.

## 2020-04-15 NOTE — Assessment & Plan Note (Signed)
In pt with known fistula that causes freq utis  Encouraged fluids  Px bactrim ds to take bid for 5 d  inst to alert if any worsening or no improvement in several days (esp if flank pain or other symptoms)  Culture pending

## 2020-04-16 ENCOUNTER — Other Ambulatory Visit: Payer: Self-pay | Admitting: Primary Care

## 2020-04-16 DIAGNOSIS — F411 Generalized anxiety disorder: Secondary | ICD-10-CM

## 2020-04-16 NOTE — Telephone Encounter (Signed)
Last prescribed on 03/17/2020 . Last OV on 04/15/2020 with Dr Milinda Antis. No future OV scheduled

## 2020-04-17 LAB — URINE CULTURE
MICRO NUMBER:: 10496218
SPECIMEN QUALITY:: ADEQUATE

## 2020-04-17 NOTE — Telephone Encounter (Signed)
Does she actually need a refill? I sent 180 tabs one month ago.

## 2020-04-17 NOTE — Telephone Encounter (Signed)
Message left for patient to return my call.  

## 2020-05-28 ENCOUNTER — Ambulatory Visit (INDEPENDENT_AMBULATORY_CARE_PROVIDER_SITE_OTHER): Payer: No Typology Code available for payment source | Admitting: Primary Care

## 2020-05-28 ENCOUNTER — Other Ambulatory Visit: Payer: Self-pay

## 2020-05-28 ENCOUNTER — Encounter: Payer: Self-pay | Admitting: Primary Care

## 2020-05-28 ENCOUNTER — Telehealth: Payer: Self-pay | Admitting: Primary Care

## 2020-05-28 VITALS — BP 122/76 | HR 80 | Temp 96.3°F | Ht 62.5 in | Wt 114.0 lb

## 2020-05-28 DIAGNOSIS — N951 Menopausal and female climacteric states: Secondary | ICD-10-CM

## 2020-05-28 DIAGNOSIS — R42 Dizziness and giddiness: Secondary | ICD-10-CM | POA: Diagnosis not present

## 2020-05-28 NOTE — Telephone Encounter (Signed)
Does she have a gynecologist? This would be a good start.  I really don't know of any hormone specialists. Endocrinology wouldn't be warranted given her normal labs.

## 2020-05-28 NOTE — Patient Instructions (Signed)
You will be contacted regarding your referral to ENT.  Please let us know if you have not been contacted within two weeks.   You can try Meclizine as needed for your symptoms, this may cause drowsiness.   It was a pleasure to see you today!

## 2020-05-28 NOTE — Telephone Encounter (Signed)
Referral placed.

## 2020-05-28 NOTE — Telephone Encounter (Signed)
Patient called, she stated she forgot to ask while at her appointment if she could also be referred to a hormone specialist as well as the ENT

## 2020-05-28 NOTE — Progress Notes (Signed)
Subjective:    Patient ID: Krystal Grant, female    DOB: 1971/05/28, 49 y.o.   MRN: [id number]  HPI  This visit occurred during the SARS-CoV-2 public health emergency.  Safety protocols were in place, including screening questions prior to the visit, additional usage of staff PPE, and extensive cleaning of exam room while observing appropriate contact time as indicated for disinfecting solutions.   Krystal Grant is a 49 year old female with a history of vertigo, anxiety disorder, hypertension who presents today with a chief complaint of dizziness.   Chronic lightheadedness/instability with nausea, left sided jaw tingling that radiates to her left tongue for the last two years. Occasional headaches, but they really don't occur during lightheadedness.   She was evaluated by neurology around 2019, was told that she was having "silent migraines". Diagnosed with vertigo, prescribed Topamax for which she never took. Also underwent MR of the brain which was negative. She did see ENT in the past who told her that her symptoms were secondary to anxiety.   Typically symptoms occur during menses, lasting 3-5 days. She did not have a menstrual cycle in June, but did experience her typical symptoms above which have been daily since. Over the last three weeks she's had daily, intermittent lightheadedness, nausea, a few episodes of vomiting, feeling unstable. This begins in the morning, sometimes before getting out of bed, and it will last 3-4 hours.   She has not tried meclizine.   BP Readings from Last 3 Encounters:  05/28/20 122/76  04/15/20 112/73  03/11/20 118/74     Review of Systems  Respiratory: Negative for shortness of breath.   Cardiovascular: Negative for chest pain.  Gastrointestinal: Positive for nausea and vomiting. Negative for diarrhea.  Neurological: Positive for light-headedness. Negative for headaches.       Past Medical History:  Diagnosis Date  . Arthritis   . Carpal tunnel  syndrome on right 03/15/2017  . Common migraine with intractable migraine 01/25/2018  . Depression   . E. coli UTI (urinary tract infection) 07/04/2018  . Fibromyalgia   . History of chicken pox   . History of shingles 2008  . History of UTI   . Mixed incontinence 08/27/2018  . Seronegative rheumatoid arthritis (HCC)   . Symptom involving bladder 07/04/2018  . 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 Marital status: Married    Spouse name: Not on file  . Number of children: 3  . Years of education: 50  . Highest education level: Not on file  Occupational History  . Occupation: [employer]   Tobacco Use  . Smoking status: Former Smoker    Types: Cigarettes    Quit date: 04/20/2018    Years since quitting: 2.1  . Smokeless tobacco: Never Used  Vaping Use  . Vaping Use: Every day  Substance and Sexual Activity  . Alcohol use: No  . Drug use: No  . Sexual activity: Not on file  Other Topics Concern  . Not on file  Social History Narrative   Lives with husband and kids   Caffeine use: 2 per day   Right handed    Social Determinants of Health   Financial Resource Strain:   . Difficulty of Paying Living Expenses:   Food Insecurity:   . Worried About Programme researcher, broadcasting/film/video in the Last Year:   . Barista in the Last Year:   Transportation Needs:   .  Lack of Transportation (Medical):   Marland Kitchen Lack of Transportation (Non-Medical):   Physical Activity:   . Days of Exercise per Week:   . Minutes of Exercise per Session:   Stress:   . Feeling of Stress :   Social Connections:   . Frequency of Communication with Friends and Family:   . Frequency of Social Gatherings with Friends and Family:   . Attends Religious Services:   . Active Member of Clubs or Organizations:   . Attends Banker Meetings:   Marland Kitchen Marital Status:   Intimate Partner Violence:   . Fear of Current or Ex-Partner:   . Emotionally Abused:   Marland Kitchen Physically Abused:     . Sexually Abused:     Past Surgical History:  Procedure Laterality Date  . CARPAL TUNNEL RELEASE  06/28/2017  . ELBOW ARTHROSCOPY  06/28/2017    Family History  Problem Relation Age of Onset  . Migraines Mother   . Hypertension Mother   . Heart attack Mother   . Nephrolithiasis Mother   . Alcohol abuse Sister   . Depression Sister   . Asthma Son   . Depression Son   . Mental illness Son   . Learning disabilities Son   . Arthritis Maternal Grandmother   . Hearing loss Maternal Grandmother   . Heart attack Maternal Grandfather   . Cancer Paternal Grandmother        Breast  . Hearing loss Paternal Grandmother   . Heart attack Paternal Grandmother   . Breast cancer Paternal Grandmother   . Heart attack Paternal Grandfather   . Uterine cancer Maternal Aunt     Allergies  Allergen Reactions  . Cymbalta [Duloxetine Hcl]     disorientation    Current Outpatient Medications on File Prior to Visit  Medication Sig Dispense Refill  . b complex vitamins capsule Take 1 capsule by mouth daily.    Marland Kitchen CRANBERRY PO Take by mouth.    . cyclobenzaprine (FLEXERIL) 10 MG tablet Take 1 tablet by mouth 3 (three) times daily as needed.    . gabapentin (NEURONTIN) 300 MG capsule Take 1 capsule by mouth 4 (four) times daily.  2  . hydrOXYzine (ATARAX/VISTARIL) 10 MG tablet TAKE 1-2 TABLETS (10-20 MG TOTAL) BY MOUTH 2 (TWO) TIMES DAILY AS NEEDED FOR ANXIETY. 180 tablet 0  . lisinopril-hydrochlorothiazide (ZESTORETIC) 10-12.5 MG tablet Take 1 tablet by mouth daily. For blood pressure. 90 tablet 3  . Multiple Vitamins-Minerals (MULTIVITAMIN WITH MINERALS) tablet Take 1 tablet by mouth daily.    . traMADol (ULTRAM) 50 MG tablet Take 50 mg by mouth every 6 (six) hours as needed for moderate pain.    . vitamin C (ASCORBIC ACID) 500 MG tablet Take 500 mg by mouth 2 (two) times daily.    . mupirocin ointment (BACTROBAN) 2 % Place 1 application into the nose 2 (two) times daily. (Patient not taking:  Reported on 05/28/2020) 22 g 0   No current facility-administered medications on file prior to visit.    BP 122/76   Pulse 80   Temp (!) 96.3 F (35.7 C) (Temporal)   Ht 5' 2.5" (1.588 m)   Wt 114 lb (51.7 kg)   LMP 04/06/2020   SpO2 98%   BMI 20.52 kg/m    Objective:   Physical Exam HENT:     Right Ear: Tympanic membrane and ear canal normal.     Left Ear: Tympanic membrane and ear canal normal.     Ears:  Comments: Mild cerumen to right canal, no blockage Eyes:     Extraocular Movements: Extraocular movements intact.  Cardiovascular:     Rate and Rhythm: Normal rate and regular rhythm.  Pulmonary:     Effort: Pulmonary effort is normal.     Breath sounds: Normal breath sounds.  Musculoskeletal:     Cervical back: Neck supple.  Skin:    General: Skin is warm and dry.  Neurological:     Mental Status: She is alert and oriented to person, place, and time.     Cranial Nerves: No cranial nerve deficit.            Assessment & Plan:

## 2020-05-28 NOTE — Assessment & Plan Note (Signed)
Unclear if this is actually vertigo, but MR has ruled out neurological cause. Do not believe this is anxiety.  Exam today unremarkable. Prior and recent lab work unremarkable. Referral placed to ENT for evaluation.

## 2020-05-28 NOTE — Telephone Encounter (Signed)
Spoken and notified patient of Krystal Grant comments. Patient stated that she doe not have a gynecologist, please place referral for one so we can start from there. Also she still would like to see a ENT

## 2020-06-04 ENCOUNTER — Encounter: Payer: Self-pay | Admitting: Primary Care

## 2020-06-04 ENCOUNTER — Telehealth (INDEPENDENT_AMBULATORY_CARE_PROVIDER_SITE_OTHER): Payer: No Typology Code available for payment source | Admitting: Primary Care

## 2020-06-04 DIAGNOSIS — J019 Acute sinusitis, unspecified: Secondary | ICD-10-CM

## 2020-06-04 HISTORY — DX: Acute sinusitis, unspecified: J01.90

## 2020-06-04 MED ORDER — AMOXICILLIN-POT CLAVULANATE 875-125 MG PO TABS: 1.0000 | ORAL_TABLET | Freq: Two times a day (BID) | ORAL | 0 refills | Status: DC

## 2020-06-04 NOTE — Patient Instructions (Signed)
Start Augmentin antibiotics for the infection Take 1 tablet by mouth twice daily for 10 days.  Please update me if no improvement within 3-4 days.  It was a pleasure to see you today! Mayra Reel, NP-C

## 2020-06-04 NOTE — Progress Notes (Signed)
Subjective:    Patient ID: Krystal Grant, female    DOB: 11-Mar-1971, 49 y.o.   MRN: [id number]  HPI  Virtual Visit via Video Note  I connected with Krystal Grant on 06/04/20 at  8:40 AM EDT by a video enabled telemedicine application and verified that I am speaking with the correct person using two identifiers.  Location: Patient: Home Provider: Office Participants: Patient and Myself   I discussed the limitations of evaluation and management by telemedicine and the availability of in person appointments. The patient expressed understanding and agreed to proceed.  History of Present Illness:  Ms. Krystal Grant is a 49 year old female with a history of hypertension, GAD, arthritis, UTI who presents today with a chief complaint of sinus pressure.  Her pressure is located to the left frontal and maxillary region with radiation behind her ear. She also endorses nasal congestion and rhinorrhea, low grade fever (100-101), chills. Symptoms began about 2 weeks ago, progressed over the last 3-4 days.   She had a Covid-19 test two days ago which was negative. She has several co-workers who have been diagnosed and treated for acute sinusitis, one co-worker developed pneumonia. She's taken Tylenol Sinus and Benadryl, also Advil without much improvement.    Observations/Objective:  Alert and oriented. Appears well, not sickly. No distress. Speaking in complete sentences. No cough.  Assessment and Plan:  Sinus pressure with vertigo symptoms x two weeks, worse over the last 3-4 days. She does appear well, no cough. Given symptoms, coupled with duration, will treat for presumed sinusitis. Rx for Augmentin course sent to pharmacy. Discussed other treatment OTC. Follow up PRN.  Follow Up Instructions:  Start Augmentin antibiotics for the infection Take 1 tablet by mouth twice daily for 10 days.  Please update me if no improvement within 3-4 days.  It was a pleasure to see you today! Krystal Reel, NP-C    I discussed the assessment and treatment plan with the patient. The patient was provided an opportunity to ask questions and all were answered. The patient agreed with the plan and demonstrated an understanding of the instructions.   The patient was advised to call back or seek an in-person evaluation if the symptoms worsen or if the condition fails to improve as anticipated.    Krystal Nest, NP    Review of Systems  Constitutional: Positive for chills and fever.  HENT: Positive for congestion, rhinorrhea, sinus pressure and sinus pain.   Respiratory: Negative for cough.   Neurological: Positive for light-headedness and headaches.       Past Medical History:  Diagnosis Date  . Arthritis   . Carpal tunnel syndrome on right 03/15/2017  . Common migraine with intractable migraine 01/25/2018  . Depression   . E. coli UTI (urinary tract infection) 07/04/2018  . Fibromyalgia   . History of chicken pox   . History of shingles 2008  . History of UTI   . Mixed incontinence 08/27/2018  . Seronegative rheumatoid arthritis (HCC)   . Symptom involving bladder 07/04/2018  . 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 Marital status: Married    Spouse name: Not on file  . Number of children: 3  . Years of education: 34  . Highest education level: Not on file  Occupational History  . Occupation: [employer]   Tobacco Use  . Smoking status: Former Smoker    Types: Cigarettes    Quit date: 04/20/2018  Years since quitting: 2.1  . Smokeless tobacco: Never Used  Vaping Use  . Vaping Use: Every day  Substance and Sexual Activity  . Alcohol use: No  . Drug use: No  . Sexual activity: Not on file  Other Topics Concern  . Not on file  Social History Narrative   Lives with husband and kids   Caffeine use: 2 per day   Right handed    Social Determinants of Health   Financial Resource Strain:   . Difficulty of Paying Living  Expenses:   Food Insecurity:   . Worried About Programme researcher, broadcasting/film/video in the Last Year:   . Barista in the Last Year:   Transportation Needs:   . Freight forwarder (Medical):   Marland Kitchen Lack of Transportation (Non-Medical):   Physical Activity:   . Days of Exercise per Week:   . Minutes of Exercise per Session:   Stress:   . Feeling of Stress :   Social Connections:   . Frequency of Communication with Friends and Family:   . Frequency of Social Gatherings with Friends and Family:   . Attends Religious Services:   . Active Member of Clubs or Organizations:   . Attends Banker Meetings:   Marland Kitchen Marital Status:   Intimate Partner Violence:   . Fear of Current or Ex-Partner:   . Emotionally Abused:   Marland Kitchen Physically Abused:   . Sexually Abused:     Past Surgical History:  Procedure Laterality Date  . CARPAL TUNNEL RELEASE  06/28/2017  . ELBOW ARTHROSCOPY  06/28/2017    Family History  Problem Relation Age of Onset  . Migraines Mother   . Hypertension Mother   . Heart attack Mother   . Nephrolithiasis Mother   . Alcohol abuse Sister   . Depression Sister   . Asthma Son   . Depression Son   . Mental illness Son   . Learning disabilities Son   . Arthritis Maternal Grandmother   . Hearing loss Maternal Grandmother   . Heart attack Maternal Grandfather   . Cancer Paternal Grandmother        Breast  . Hearing loss Paternal Grandmother   . Heart attack Paternal Grandmother   . Breast cancer Paternal Grandmother   . Heart attack Paternal Grandfather   . Uterine cancer Maternal Aunt     Allergies  Allergen Reactions  . Cymbalta [Duloxetine Hcl]     disorientation    Current Outpatient Medications on File Prior to Visit  Medication Sig Dispense Refill  . b complex vitamins capsule Take 1 capsule by mouth daily.    Marland Kitchen CRANBERRY PO Take by mouth.    . cyclobenzaprine (FLEXERIL) 10 MG tablet Take 1 tablet by mouth 3 (three) times daily as needed.    .  gabapentin (NEURONTIN) 300 MG capsule Take 1 capsule by mouth 4 (four) times daily.  2  . hydrOXYzine (ATARAX/VISTARIL) 10 MG tablet TAKE 1-2 TABLETS (10-20 MG TOTAL) BY MOUTH 2 (TWO) TIMES DAILY AS NEEDED FOR ANXIETY. 180 tablet 0  . lisinopril-hydrochlorothiazide (ZESTORETIC) 10-12.5 MG tablet Take 1 tablet by mouth daily. For blood pressure. 90 tablet 3  . Multiple Vitamins-Minerals (MULTIVITAMIN WITH MINERALS) tablet Take 1 tablet by mouth daily.    . traMADol (ULTRAM) 50 MG tablet Take 50 mg by mouth every 6 (six) hours as needed for moderate pain.    . vitamin C (ASCORBIC ACID) 500 MG tablet Take 500 mg by mouth  2 (two) times daily.    . mupirocin ointment (BACTROBAN) 2 % Place 1 application into the nose 2 (two) times daily. (Patient not taking: Reported on 06/04/2020) 22 g 0   No current facility-administered medications on file prior to visit.    LMP 05/24/2020    Objective:   Physical Exam Constitutional:      Appearance: Normal appearance. She is not ill-appearing.  Pulmonary:     Effort: Pulmonary effort is normal.     Comments: No cough Neurological:     Mental Status: She is alert.            Assessment & Plan:

## 2020-06-04 NOTE — Assessment & Plan Note (Signed)
Sinus pressure with vertigo symptoms x two weeks, worse over the last 3-4 days. She does appear well, no cough. Given symptoms, coupled with duration, will treat for presumed sinusitis. Rx for Augmentin course sent to pharmacy. Discussed other treatment OTC. Follow up PRN.

## 2020-06-17 ENCOUNTER — Other Ambulatory Visit: Payer: Self-pay

## 2020-06-17 ENCOUNTER — Encounter (INDEPENDENT_AMBULATORY_CARE_PROVIDER_SITE_OTHER): Payer: Self-pay | Admitting: Otolaryngology

## 2020-06-17 ENCOUNTER — Ambulatory Visit (INDEPENDENT_AMBULATORY_CARE_PROVIDER_SITE_OTHER): Payer: No Typology Code available for payment source | Admitting: Otolaryngology

## 2020-06-17 VITALS — Temp 97.5°F

## 2020-06-17 DIAGNOSIS — R42 Dizziness and giddiness: Secondary | ICD-10-CM | POA: Diagnosis not present

## 2020-06-17 DIAGNOSIS — H6121 Impacted cerumen, right ear: Secondary | ICD-10-CM

## 2020-06-17 NOTE — Progress Notes (Signed)
HPI: Krystal Grant is a 49 y.o. female who presents is referred by her PCP for evaluation of dizziness.  She describes some symptoms of vertigo as well as some symptoms of not knowing exactly where she is.  This occurs intermittently.  She may go several days with no symptoms at all.  Sometimes she has sensitivity to light.  Sometimes the symptoms occur when she rolls over in bed.  She stated that she had an episode this morning when she was getting out of bed and rolled to her left.  She also states that the left ear feels a little more stuffy or muffled than the right. She was seen by neurologist a couple years ago that diagnosed her with silent migraine and prescribed medication but she has not started this medication.  She had MRI scan performed in March 2019 that I reviewed that showed clear paranasal sinuses.  Clear mastoid and middle ear structures..  Past Medical History:  Diagnosis Date   Acute cystitis 04/15/2020   Arthritis    Carpal tunnel syndrome on right 03/15/2017   Common migraine with intractable migraine 01/25/2018   Depression    E. 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   Past Surgical History:  Procedure Laterality Date   CARPAL TUNNEL RELEASE  06/28/2017   ELBOW ARTHROSCOPY  06/28/2017   Social History   Socioeconomic History   Marital status: Married    Spouse name: Not on file   Number of children: 3   Years of education: 12   Highest education level: Not on file  Occupational History   Occupation: [employer]   Tobacco Use   Smoking status: Former Smoker    Packs/day: 1.00    Years: 20.00    Pack years: 20.00    Types: Cigarettes    Quit date: 04/20/2018    Years since quitting: 2.1   Smokeless  tobacco: Never Used  Vaping Use   Vaping Use: Every day  Substance and Sexual Activity   Alcohol use: No   Drug use: No   Sexual activity: Not on file  Other Topics Concern   Not on file  Social History Narrative   Lives with husband and kids   Caffeine use: 2 per day   Right handed    Social Determinants of Health   Financial Resource Strain:    Difficulty of Paying Living Expenses:   Food Insecurity:    Worried About Programme researcher, broadcasting/film/video in the Last Year:    Barista in the Last Year:   Transportation Needs:    Freight forwarder (Medical):    Lack of Transportation (Non-Medical):   Physical Activity:    Days of Exercise per Week:    Minutes of Exercise per Session:   Stress:    Feeling of Stress :   Social Connections:    Frequency of Communication with Friends and Family:    Frequency of Social Gatherings with Friends and Family:    Attends Religious Services:    Active Member of Clubs or Organizations:    Attends Banker Meetings:    Marital Status:    Family History  Problem Relation Age of Onset   Migraines Mother  Hypertension Mother    Heart attack Mother    Nephrolithiasis Mother    Alcohol abuse Sister    Depression Sister    Asthma Son    Depression Son    Mental illness Son    Learning disabilities Son    Arthritis Maternal Grandmother    Hearing loss Maternal Grandmother    Heart attack Maternal Grandfather    Cancer Paternal Grandmother        Breast   Hearing loss Paternal Grandmother    Heart attack Paternal Grandmother    Breast cancer Paternal Grandmother    Heart attack Paternal Grandfather    Uterine cancer Maternal Aunt    Allergies  Allergen Reactions   Cymbalta [Duloxetine Hcl]     disorientation   Prior to Admission medications   Medication Sig Start Date End Date Taking? Authorizing Provider  amoxicillin-clavulanate (AUGMENTIN) 875-125 MG tablet Take 1 tablet by  mouth 2 (two) times daily. 06/04/20  Yes Doreene Nest, NP  b complex vitamins capsule Take 1 capsule by mouth daily.   Yes [provider]  CRANBERRY PO Take by mouth.   Yes [provider]  cyclobenzaprine (FLEXERIL) 10 MG tablet Take 1 tablet by mouth 3 (three) times daily as needed. 06/17/18  Yes [provider]  gabapentin (NEURONTIN) 300 MG capsule Take 1 capsule by mouth 4 (four) times daily. 06/08/18  Yes [provider]  hydrOXYzine (ATARAX/VISTARIL) 10 MG tablet TAKE 1-2 TABLETS (10-20 MG TOTAL) BY MOUTH 2 (TWO) TIMES DAILY AS NEEDED FOR ANXIETY. 03/17/20  Yes Doreene Nest, NP  lisinopril-hydrochlorothiazide (ZESTORETIC) 10-12.5 MG tablet Take 1 tablet by mouth daily. For blood pressure. 03/11/20  Yes Doreene Nest, NP  Multiple Vitamins-Minerals (MULTIVITAMIN WITH MINERALS) tablet Take 1 tablet by mouth daily.   Yes [provider]  mupirocin ointment (BACTROBAN) 2 % Place 1 application into the nose 2 (two) times daily. 02/26/20  Yes Doreene Nest, NP  traMADol (ULTRAM) 50 MG tablet Take 50 mg by mouth every 6 (six) hours as needed for moderate pain.   Yes [provider]  vitamin C (ASCORBIC ACID) 500 MG tablet Take 500 mg by mouth 2 (two) times daily.   Yes [provider]     Positive ROS: Otherwise negative  All other systems have been reviewed and were otherwise negative with the exception of those mentioned in the HPI and as above.  Physical Exam: Constitutional: Alert, well-appearing, no acute distress Ears: External ears without lesions or tenderness.  On examination of the ears she has wax adjacent to the right TM as she is used Q-tips.  The wax was removed with suction.  The right TM is otherwise clear.  The left ear canal and left TM are clear with good mobility on pneumatic otoscopy.  On hearing screening with the 512 1024 tuning fork she hears about the same in both ears.  On Dix-Hallpike testing  in the office she had no evidence of nystagmus or positional vertigo in the office this morning. Nasal: External nose without lesions. Septum with spur to the left but clear nasal passages otherwise.  Both millimeters regions were clear.. Oral: Lips and gums without lesions. Tongue and palate mucosa without lesions. Posterior oropharynx clear. Neck: No palpable adenopathy or masses Respiratory: Breathing comfortably  Skin: No facial/neck lesions or rash noted.  Cerumen impaction removal  Date/Time: 06/17/2020 10:08 AM Performed by: Drema Halon, MD Authorized by: Drema Halon, MD   Consent:  Consent obtained:  Verbal   Consent given by:  Patient   Risks discussed:  Pain and bleeding Procedure details:    Location:  R ear   Procedure type: suction   Post-procedure details:    Inspection:  TM intact and canal normal   Hearing quality:  Improved   Patient tolerance of procedure:  Tolerated well, no immediate complications Comments:     TMs are clear bilaterally.    Assessment: History of dizziness and vertigo. Right ear cerumen buildup  Plan: We will plan on scheduling patient for VNG testing and audiologic testing to evaluate inner ear function.  Patient will follow-up following VNG testing and audiologic testing.  If this testing is negative would recommend further evaluation or treatment with neurology and treatment of silent migraine that was diagnosed by neurology.   Narda Bonds, MD   CC:

## 2020-07-06 ENCOUNTER — Encounter: Payer: 59 | Admitting: Obstetrics and Gynecology

## 2020-07-08 ENCOUNTER — Ambulatory Visit (INDEPENDENT_AMBULATORY_CARE_PROVIDER_SITE_OTHER): Payer: No Typology Code available for payment source | Admitting: Otolaryngology

## 2020-07-29 ENCOUNTER — Encounter (INDEPENDENT_AMBULATORY_CARE_PROVIDER_SITE_OTHER): Payer: Self-pay | Admitting: Otolaryngology

## 2020-07-29 ENCOUNTER — Ambulatory Visit (INDEPENDENT_AMBULATORY_CARE_PROVIDER_SITE_OTHER): Payer: No Typology Code available for payment source | Admitting: Otolaryngology

## 2020-07-29 ENCOUNTER — Other Ambulatory Visit: Payer: Self-pay

## 2020-07-29 VITALS — Temp 97.3°F

## 2020-07-29 DIAGNOSIS — R42 Dizziness and giddiness: Secondary | ICD-10-CM

## 2020-07-29 DIAGNOSIS — H9312 Tinnitus, left ear: Secondary | ICD-10-CM

## 2020-07-29 NOTE — Progress Notes (Signed)
HPI: Krystal Grant is a 49 y.o. female who returns today for evaluation of dizziness.  Patient returns today following audiologic testing and VNG testing to evaluate episodes of vertigo as well as her hearing.  She has had an intermittent pulse that she hears in her left ear that is doing better since having her blood pressure lowered with medication. Reviewed the hearing test with her in the office today which demonstrated normal hearing in both ears with SRT's of 10 dB bilaterally.  She had type A tympanograms in both ears.  Also on review of her VNG testing which evaluated her vestibular system this was normal with no vestibular abnormality and normal vestibular function. Reviewed with her that if symptoms persist with the dizziness would recommend further evaluation with neurologist..  Past Medical History:  Diagnosis Date   Acute cystitis 04/15/2020   Arthritis    Carpal tunnel syndrome on right 03/15/2017   Common migraine with intractable migraine 01/25/2018   Depression    E. 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   Past Surgical History:  Procedure Laterality Date   CARPAL TUNNEL RELEASE  06/28/2017   ELBOW ARTHROSCOPY  06/28/2017   Social History   Socioeconomic History   Marital status: Married    Spouse name: Not on file   Number of children: 3   Years of education: 12   Highest education level: Not on file  Occupational History   Occupation: [employer]   Tobacco Use   Smoking status: Former Smoker    Packs/day: 1.00    Years: 20.00    Pack years: 20.00    Types: Cigarettes    Quit date: 04/20/2018    Years since quitting: 2.2   Smokeless tobacco: Never Used  Vaping Use   Vaping Use: Every day  Substance  and Sexual Activity   Alcohol use: No   Drug use: No   Sexual activity: Not on file  Other Topics Concern   Not on file  Social History Narrative   Lives with husband and kids   Caffeine use: 2 per day   Right handed    Social Determinants of Health   Financial Resource Strain:    Difficulty of Paying Living Expenses: Not on file  Food Insecurity:    Worried About Programme researcher, broadcasting/film/video in the Last Year: Not on file   [employer] of Food in the Last Year: Not on file  Transportation Needs:    Lack of Transportation (Medical): Not on file   Lack of Transportation (Non-Medical): Not on file  Physical Activity:    Days of Exercise per Week: Not on file   Minutes of Exercise per Session: Not on file  Stress:    Feeling of Stress : Not on file  Social Connections:    Frequency of Communication with Friends and Family: Not on file   Frequency of Social Gatherings with Friends and Family: Not on file   Attends Religious Services: Not on file   Active Member of Clubs or Organizations: Not on file   Attends Banker Meetings: Not on file   Marital Status: Not on file   Family History  Problem Relation Age of Onset   Migraines Mother  Hypertension Mother    Heart attack Mother    Nephrolithiasis Mother    Alcohol abuse Sister    Depression Sister    Asthma Son    Depression Son    Mental illness Son    Learning disabilities Son    Arthritis Maternal Grandmother    Hearing loss Maternal Grandmother    Heart attack Maternal Grandfather    Cancer Paternal Grandmother        Breast   Hearing loss Paternal Grandmother    Heart attack Paternal Grandmother    Breast cancer Paternal Grandmother    Heart attack Paternal Grandfather    Uterine cancer Maternal Aunt    Allergies  Allergen Reactions   Cymbalta [Duloxetine Hcl]     disorientation   Prior to Admission medications   Medication Sig Start Date End Date Taking?  Authorizing Provider  amoxicillin-clavulanate (AUGMENTIN) 875-125 MG tablet Take 1 tablet by mouth 2 (two) times daily. 06/04/20  Yes Doreene Nest, NP  b complex vitamins capsule Take 1 capsule by mouth daily.   Yes [provider]  CRANBERRY PO Take by mouth.   Yes [provider]  cyclobenzaprine (FLEXERIL) 10 MG tablet Take 1 tablet by mouth 3 (three) times daily as needed. 06/17/18  Yes [provider]  gabapentin (NEURONTIN) 300 MG capsule Take 1 capsule by mouth 4 (four) times daily. 06/08/18  Yes [provider]  hydrOXYzine (ATARAX/VISTARIL) 10 MG tablet TAKE 1-2 TABLETS (10-20 MG TOTAL) BY MOUTH 2 (TWO) TIMES DAILY AS NEEDED FOR ANXIETY. 03/17/20  Yes Doreene Nest, NP  lisinopril-hydrochlorothiazide (ZESTORETIC) 10-12.5 MG tablet Take 1 tablet by mouth daily. For blood pressure. 03/11/20  Yes Doreene Nest, NP  Multiple Vitamins-Minerals (MULTIVITAMIN WITH MINERALS) tablet Take 1 tablet by mouth daily.   Yes [provider]  mupirocin ointment (BACTROBAN) 2 % Place 1 application into the nose 2 (two) times daily. 02/26/20  Yes Doreene Nest, NP  traMADol (ULTRAM) 50 MG tablet Take 50 mg by mouth every 6 (six) hours as needed for moderate pain.   Yes [provider]  vitamin C (ASCORBIC ACID) 500 MG tablet Take 500 mg by mouth 2 (two) times daily.   Yes [provider]     Positive ROS: Otherwise negative  All other systems have been reviewed and were otherwise negative with the exception of those mentioned in the HPI and as above.  Physical Exam: Constitutional: Alert, well-appearing, no acute distress Ears: External ears without lesions or tenderness. Ear canals are clear bilaterally with intact, clear TMs.  Auscultation of the ears revealed no objective pulsatile tinnitus on either side.  Auscultation of the neck reveals no carotid bruits. Nasal: External nose without lesions. Clear nasal passages Oral:  Lips and gums without lesions. Tongue and palate mucosa without lesions. Posterior oropharynx clear. Neck: No palpable adenopathy or masses Respiratory: Breathing comfortably  Skin: No facial/neck lesions or rash noted.  Procedures  Assessment: History of episodic vertigo. Left ear subjective pulsatile tinnitus that comes and goes.  Plan: Reviewed with her today concerning normal ear and vestibular examination and testing.  Would recommend further evaluation with neurology and gave her copies of the VNG testing and audiologic testing.   Narda Bonds, MD

## 2020-07-30 ENCOUNTER — Encounter (INDEPENDENT_AMBULATORY_CARE_PROVIDER_SITE_OTHER): Payer: Self-pay

## 2020-08-26 ENCOUNTER — Other Ambulatory Visit: Payer: Self-pay | Admitting: Primary Care

## 2020-08-26 DIAGNOSIS — N6320 Unspecified lump in the left breast, unspecified quadrant: Secondary | ICD-10-CM

## 2020-08-28 ENCOUNTER — Encounter: Payer: No Typology Code available for payment source | Admitting: Primary Care

## 2020-09-28 ENCOUNTER — Ambulatory Visit
Admission: RE | Admit: 2020-09-28 | Discharge: 2020-09-28 | Disposition: A | Discharge status: Home / Self Care | Payer: No Typology Code available for payment source | Source: Ambulatory Visit | Attending: Primary Care | Admitting: Primary Care

## 2020-09-28 ENCOUNTER — Other Ambulatory Visit: Payer: Self-pay

## 2020-09-28 DIAGNOSIS — N6320 Unspecified lump in the left breast, unspecified quadrant: Secondary | ICD-10-CM

## 2020-10-16 ENCOUNTER — Other Ambulatory Visit: Payer: Self-pay

## 2020-10-16 ENCOUNTER — Ambulatory Visit (INDEPENDENT_AMBULATORY_CARE_PROVIDER_SITE_OTHER): Payer: No Typology Code available for payment source | Admitting: Internal Medicine

## 2020-10-16 ENCOUNTER — Encounter: Payer: Self-pay | Admitting: Internal Medicine

## 2020-10-16 VITALS — BP 110/64 | HR 90 | Temp 97.7°F | Ht 62.5 in | Wt 111.0 lb

## 2020-10-16 DIAGNOSIS — R300 Dysuria: Secondary | ICD-10-CM

## 2020-10-16 DIAGNOSIS — N3000 Acute cystitis without hematuria: Secondary | ICD-10-CM

## 2020-10-16 DIAGNOSIS — N390 Urinary tract infection, site not specified: Secondary | ICD-10-CM | POA: Insufficient documentation

## 2020-10-16 LAB — POC URINALSYSI DIPSTICK (AUTOMATED)
Bilirubin, UA: NEGATIVE
Glucose, UA: NEGATIVE
Ketones, UA: NEGATIVE
Leukocytes, UA: NEGATIVE
Nitrite, UA: NEGATIVE
Protein, UA: NEGATIVE
Spec Grav, UA: 1.01 (ref 1.010–1.025)
Urobilinogen, UA: 0.2 E.U./dL
pH, UA: 6.5 (ref 5.0–8.0)

## 2020-10-16 MED ORDER — CEPHALEXIN 250 MG PO TABS: 250.0000 mg | ORAL_TABLET | Freq: Three times a day (TID) | ORAL | 3 refills | Status: DC

## 2020-10-16 NOTE — Assessment & Plan Note (Signed)
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

## 2020-10-16 NOTE — Progress Notes (Signed)
Subjective:    Patient ID: Krystal Grant, female    DOB: 22-Nov-1971, 49 y.o.   MRN: [id number]  HPI Here due to urinary symptoms This visit occurred during the SARS-CoV-2 public health emergency.  Safety protocols were in place, including screening questions prior to the visit, additional usage of staff PPE, and extensive cleaning of exam room while observing appropriate contact time as indicated for disinfecting solutions.   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 b complex vitamins capsule Take 1 capsule by mouth daily.    Marland Kitchen CRANBERRY PO Take by mouth.    . cyclobenzaprine (FLEXERIL) 10 MG tablet Take 1 tablet by mouth 3 (three) times daily as needed.    . gabapentin (NEURONTIN) 100 MG capsule Take 100 mg by mouth daily.    Marland Kitchen lisinopril-hydrochlorothiazide (ZESTORETIC) 10-12.5 MG tablet Take 1 tablet by mouth daily. For blood pressure. 90 tablet 3  . Multiple Vitamins-Minerals (MULTIVITAMIN WITH MINERALS) tablet Take 1 tablet by mouth daily.    . mupirocin ointment (BACTROBAN) 2 % Place 1 application into the nose 2 (two) times daily. 22 g 0  . traMADol (ULTRAM) 50 MG tablet Take 50 mg by mouth every 6 (six) hours as needed for moderate pain.    . vitamin C (ASCORBIC ACID) 500 MG tablet Take 500 mg by mouth 2 (two) times daily.     No current facility-administered medications on file prior to visit.    Allergies  Allergen Reactions  . Cymbalta [Duloxetine Hcl]     disorientation     Past Medical History:  Diagnosis Date  . Acute cystitis 04/15/2020  . Arthritis   . Carpal tunnel syndrome on right 03/15/2017  . Common migraine with intractable migraine 01/25/2018  . Depression   . E. coli UTI (urinary tract infection) 07/04/2018  . Fibromyalgia   . History of chicken pox   . History of shingles 2008  . History of UTI   . Mixed incontinence 08/27/2018  . Seronegative rheumatoid arthritis (HCC)   . Symptom involving bladder 07/04/2018  . 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 CARPAL TUNNEL RELEASE  06/28/2017  . ELBOW ARTHROSCOPY  06/28/2017    Family History  Problem Relation Age of Onset  . Migraines Mother   . Hypertension Mother   . Heart attack Mother   . Nephrolithiasis Mother   . Alcohol abuse Sister   . Depression Sister   . Asthma Son   . Depression Son   . Mental illness Son   . Learning disabilities Son   . Arthritis Maternal Grandmother   . Hearing loss Maternal Grandmother   . Heart attack Maternal Grandfather   . Cancer Paternal Grandmother        Breast  . Hearing loss Paternal Grandmother   . Heart attack Paternal Grandmother   . Breast cancer Paternal Grandmother   . Heart attack Paternal Grandfather   . Uterine cancer Maternal Aunt     Social  History   Socioeconomic History  . Marital status: Married    Spouse name: Not on file  . Number of children: 3  . Years of education: 88  . Highest education level: Not on file  Occupational History  . Occupation: [employer]   Tobacco Use  . Smoking status: Former Smoker    Packs/day: 1.00    Years: 20.00    Pack years: 20.00    Types: Cigarettes    Quit date: 04/20/2018    Years since quitting: 2.4  . Smokeless tobacco: Never Used  Vaping Use  . Vaping Use: Every day  Substance and Sexual Activity  . Alcohol use: No  . Drug use: No  . Sexual activity: Not on file  Other Topics Concern  . Not on file  Social History  Narrative   Lives with husband and kids   Caffeine use: 2 per day   Right handed    Social Determinants of Health   Financial Resource Strain:   . Difficulty of Paying Living Expenses: Not on file  Food Insecurity:   . Worried About Programme researcher, broadcasting/film/video in the Last Year: Not on file  . Ran Out of Food in the Last Year: Not on file  Transportation Needs:   . Lack of Transportation (Medical): Not on file  . Lack of Transportation (Non-Medical): Not on file  Physical Activity:   . Days of Exercise per Week: Not on file  . Minutes of Exercise per Session: Not on file  Stress:   . Feeling of Stress : Not on file  Social Connections:   . Frequency of Communication with Friends and Family: Not on file  . Frequency of Social Gatherings with Friends and Family: Not on file  . Attends Religious Services: Not on file  . Active Member of Clubs or Organizations: Not on file  . Attends Banker Meetings: Not on file  . Marital Status: Not on file  Intimate Partner Violence:   . Fear of Current or Ex-Partner: Not on file  . Emotionally Abused: Not on file  . Physically Abused: Not on file  . Sexually Abused: Not on file   Review of Systems  No fever Some nausea but no vomiting Eating okay--but appetite is off     Objective:   Physical Exam Constitutional:      Appearance: Normal appearance.  Abdominal:     Palpations: Abdomen is soft.     Tenderness: There is no right CVA tenderness or left CVA tenderness.     Comments: Mild suprapubic tenderness  Neurological:     Mental Status: She is alert.            Assessment & Plan:

## 2020-12-16 ENCOUNTER — Other Ambulatory Visit: Payer: No Typology Code available for payment source

## 2020-12-16 DIAGNOSIS — Z20822 Contact with and (suspected) exposure to covid-19: Secondary | ICD-10-CM

## 2020-12-17 LAB — NOVEL CORONAVIRUS, NAA: SARS-CoV-2, NAA: DETECTED — AB

## 2020-12-17 LAB — SARS-COV-2, NAA 2 DAY TAT

## 2020-12-22 ENCOUNTER — Telehealth: Payer: Self-pay | Admitting: *Deleted

## 2020-12-22 NOTE — Telephone Encounter (Signed)
Spoke to patient by telephone and scheduled her for the virtual visit tomorrow per Jae Dire. Patient was given ER precautions again and she verbalized understanding.

## 2020-12-22 NOTE — Telephone Encounter (Signed)
Yes, she needs a virtual visit given ongoing symptoms. I can add her on tomorrow 12/23/2020 at 12:20 PM.

## 2020-12-22 NOTE — Telephone Encounter (Signed)
Patient called stating that she tested positive for covid on 12/16/20. Patient stated that she has had a fever now for 7 days over 100.0 even with tylenol. Patient stated her temperature this morning was 101.5. Patient stated that she has a cough, feels real tired and gets winded during simple task. Patient stated her main concern is the fact that she feels so confused and feels like her mind is foggy. Patient stated that her memory has been terrible. Patient stated that she has been checking her pulse ox and it has running 98-99%. Patient stated that she does not feel that she is having SOB or difficulty breathing. Patient was given ER precautions and she verbalized understanding. Patient requested that a message go back to Mayra Reel NP to see if she has recommendations or should she do a virtual visit with someone. Patient was advised to rest, drink plenty of fluids and to eat well balanced meals. Patient requested a call back.

## 2020-12-23 ENCOUNTER — Encounter: Payer: Self-pay | Admitting: Primary Care

## 2020-12-23 ENCOUNTER — Telehealth (INDEPENDENT_AMBULATORY_CARE_PROVIDER_SITE_OTHER): Payer: No Typology Code available for payment source | Admitting: Primary Care

## 2020-12-23 DIAGNOSIS — U099 Post covid-19 condition, unspecified: Secondary | ICD-10-CM

## 2020-12-23 DIAGNOSIS — U071 COVID-19: Secondary | ICD-10-CM | POA: Diagnosis not present

## 2020-12-23 HISTORY — DX: Post covid-19 condition, unspecified: U09.9

## 2020-12-23 NOTE — Assessment & Plan Note (Signed)
Positive for Covid-19 with lingering symptoms x 8 days, no significant improvement. Suspect Delta variant given intensity and lingering symptoms, especially in a person with three Covid-19 vaccines.  She does appear stable.  Recommended she take ibuprofen 200 mg with Tylenol 650 mg every 8 hours until her fevers break.  I do not recommend she return to work, will write her out to return on 12/29/20 if symptoms have improved and she's been without a fever for 24 hours without ibuprofen and Tylenol.  She will update Monday next week. Return precautions provided.

## 2020-12-23 NOTE — Patient Instructions (Signed)
  Start taking Ibuprofen 200 mg with Tylenol 650 mg every 8 hours for fevers, body aches, chills, headaches.  Remain out of work through January 31st. You may return if your symptoms have improved and you've been fever free for 24 hours without Ibuprofen and Tylenol.   Please  Update me Monday next week.  It was a pleasure to see you today! Mayra Reel, NP-C

## 2020-12-23 NOTE — Progress Notes (Signed)
Subjective:    Patient ID: Krystal Grant, female    DOB: [birth year], 50 y.o.   MRN: [id number]  HPI  This visit occurred during the SARS-CoV-2 public health emergency.  Safety protocols were in place, including screening questions prior to the visit, additional usage of staff PPE, and extensive cleaning of exam room while observing appropriate contact time as indicated for disinfecting solutions.   Virtual Visit via Video Note  I connected with Krystal Grant on 12/23/20 at 12:20 PM EST by a video enabled telemedicine application and verified that I am speaking with the correct person using two identifiers.  Location: Patient: Home Provider: Office Participants: Patient and myself    I discussed the limitations of evaluation and management by telemedicine and the availability of in person appointments. The patient expressed understanding and agreed to proceed.  History of Present Illness:  Krystal Grant is a 50 year old female with a history of hypertension, sinusitis, arthritis, migraines who presents today with a chief complaint of fevers.  She also reports body aches, chills, cough. She tested positive for Covid-19 on 12/16/20. Symptoms began on 12/15/20. She has been out of work since 12/15/20.  Her temperatures were ranging 99-102, over the last several days she's running 100-101.5 recently, can't get fever below 100. She's been taking Tylenol and Ibuprofen 1-2 times daily with temporary improvement. Her cough is about the same but overall is not bothersome. She's mostly bothered by brain fog, chills, fevers, tingling.   She's been out of work from 12/15/20, her employer is wanting her to return today. She will need a work excuse. She has had three Covid vaccines.   Observations/Objective:  Alert and oriented. Appears tired. No distress. Speaking in complete sentences. No cough during visit.  Assessment and Plan:  Positive for Covid-19 with lingering symptoms x 8 days, no  significant improvement. Suspect Delta variant given intensity and lingering symptoms, especially in a person with three Covid-19 vaccines.  She does appear stable.  Recommended she take ibuprofen 200 mg with Tylenol 650 mg every 8 hours until her fevers break.  I do not recommend she return to work, will write her out to return on 12/29/20 if symptoms have improved and she's been without a fever for 24 hours without ibuprofen and Tylenol.  She will update Monday next week. Return precautions provided.   Follow Up Instructions:  Start taking Ibuprofen 200 mg with Tylenol 650 mg every 8 hours for fevers, body aches, chills, headaches.  Remain out of work through January 31st. You may return if your symptoms have improved and you've been fever free for 24 hours without Ibuprofen and Tylenol.   Please  Update me Monday next week.  It was a pleasure to see you today! Mayra Reel, NP-C    I discussed the assessment and treatment plan with the patient. The patient was provided an opportunity to ask questions and all were answered. The patient agreed with the plan and demonstrated an understanding of the instructions.   The patient was advised to call back or seek an in-person evaluation if the symptoms worsen or if the condition fails to improve as anticipated.    Doreene Nest, NP    Review of Systems  Constitutional: Positive for chills, fatigue and fever.  HENT: Positive for congestion.   Respiratory: Positive for cough.   Musculoskeletal: Positive for myalgias.  Neurological: Positive for headaches.       Past Medical History:  Diagnosis Date  . Acute cystitis  04/15/2020  . Arthritis   . Carpal tunnel syndrome on right 03/15/2017  . Common migraine with intractable migraine 01/25/2018  . Depression   . E. coli UTI (urinary tract infection) 07/04/2018  . Fibromyalgia   . History of chicken pox   . History of shingles 2008  . History of UTI   . Mixed incontinence  08/27/2018  . Seronegative rheumatoid arthritis (HCC)   . Symptom involving bladder 07/04/2018  . 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 Marital status: Married    Spouse name: Not on file  . Number of children: 3  . Years of education: 39  . Highest education level: Not on file  Occupational History  . Occupation: [employer]   Tobacco Use  . Smoking status: Former Smoker    Packs/day: 1.00    Years: 20.00    Pack years: 20.00    Types: Cigarettes    Quit date: 04/20/2018    Years since quitting: 2.6  . Smokeless tobacco: Never Used  Vaping Use  . Vaping Use: Every day  Substance and Sexual Activity  . Alcohol use: No  . Drug use: No  . Sexual activity: Not on file  Other Topics Concern  . Not on file  Social History Narrative   Lives with husband and kids   Caffeine use: 2 per day   Right handed    Social Determinants of Health   Financial Resource Strain: Not on file  Food Insecurity: Not on file  Transportation Needs: Not on file  Physical Activity: Not on file  Stress: Not on file  Social Connections: Not on file  Intimate Partner Violence: Not on file    Past Surgical History:  Procedure Laterality Date  . CARPAL TUNNEL RELEASE  06/28/2017  . ELBOW ARTHROSCOPY  06/28/2017    Family History  Problem Relation Age of Onset  . Migraines Mother   . Hypertension Mother   . Heart attack Mother   . Nephrolithiasis Mother   . Alcohol abuse Sister   . Depression Sister   . Asthma Son   . Depression Son   . Mental illness Son   . Learning disabilities Son   . Arthritis Maternal Grandmother   . Hearing loss Maternal Grandmother   . Heart attack Maternal Grandfather   . Cancer Paternal Grandmother        Breast  . Hearing loss Paternal Grandmother   . Heart attack Paternal Grandmother   . Breast cancer Paternal Grandmother   . Heart attack Paternal Grandfather   . Uterine cancer Maternal Aunt     Allergies   Allergen Reactions  . Cymbalta [Duloxetine Hcl]     disorientation  . Iodine Other (See Comments)    Had a vaginal procedure using iodine- her vaginal burned and peeled    Current Outpatient Medications on File Prior to Visit  Medication Sig Dispense Refill  . b complex vitamins capsule Take 1 capsule by mouth daily.    Marland Kitchen CRANBERRY PO Take by mouth.    . cyclobenzaprine (FLEXERIL) 10 MG tablet Take 1 tablet by mouth 3 (three) times daily as needed.    . gabapentin (NEURONTIN) 100 MG capsule Take 100 mg by mouth daily.    Marland Kitchen lisinopril-hydrochlorothiazide (ZESTORETIC) 10-12.5 MG tablet Take 1 tablet by mouth daily. For blood pressure. 90 tablet 3  . Multiple Vitamins-Minerals (MULTIVITAMIN WITH MINERALS) tablet Take 1 tablet by mouth daily.    Marland Kitchen  mupirocin ointment (BACTROBAN) 2 % Place 1 application into the nose 2 (two) times daily. 22 g 0  . traMADol (ULTRAM) 50 MG tablet Take 50 mg by mouth every 6 (six) hours as needed for moderate pain.    . vitamin C (ASCORBIC ACID) 500 MG tablet Take 500 mg by mouth 2 (two) times daily.     No current facility-administered medications on file prior to visit.    BP 97/73   Pulse 79   Temp (!) 100.5 F (38.1 C) (Temporal)   Ht 5' 2.5" (1.588 m)   Wt 111 lb (50.3 kg)   BMI 19.98 kg/m    Objective:   Physical Exam Constitutional:      General: She is not in acute distress.    Appearance: She is ill-appearing.  Pulmonary:     Effort: Pulmonary effort is normal.     Comments: No cough during visit Neurological:     Mental Status: She is alert and oriented to person, place, and time.  Psychiatric:        Mood and Affect: Mood normal.            Assessment & Plan:

## 2021-02-03 ENCOUNTER — Ambulatory Visit (INDEPENDENT_AMBULATORY_CARE_PROVIDER_SITE_OTHER): Payer: No Typology Code available for payment source | Admitting: Primary Care

## 2021-02-03 ENCOUNTER — Other Ambulatory Visit: Payer: Self-pay | Admitting: Primary Care

## 2021-02-03 ENCOUNTER — Encounter: Payer: Self-pay | Admitting: Primary Care

## 2021-02-03 ENCOUNTER — Other Ambulatory Visit: Payer: Self-pay

## 2021-02-03 VITALS — BP 104/78 | HR 71 | Temp 98.3°F | Ht 62.5 in | Wt 107.0 lb

## 2021-02-03 DIAGNOSIS — F411 Generalized anxiety disorder: Secondary | ICD-10-CM

## 2021-02-03 DIAGNOSIS — U099 Post covid-19 condition, unspecified: Secondary | ICD-10-CM | POA: Diagnosis not present

## 2021-02-03 DIAGNOSIS — I10 Essential (primary) hypertension: Secondary | ICD-10-CM

## 2021-02-03 MED ORDER — BUSPIRONE HCL 5 MG PO TABS: 5.0000 mg | ORAL_TABLET | Freq: Two times a day (BID) | ORAL | 0 refills | Status: DC

## 2021-02-03 MED ORDER — HYDROXYZINE PAMOATE 25 MG PO CAPS: 25.0000 mg | ORAL_CAPSULE | Freq: Two times a day (BID) | ORAL | 0 refills | Status: DC | PRN

## 2021-02-03 NOTE — Progress Notes (Signed)
Subjective:    Patient ID: Krystal Grant, female    DOB: 06-01-1971, 50 y.o.   MRN: [id number]  HPI  Krystal Grant a 50 y.o. female with a history of hypertension, GAD, Covid-19 infection who presents today to discuss anxiety.   Covid 19 infection in January 2022, still struggling with intermittent symptoms since Covid-19 infection. Symptoms include "brain fog", low grade fevers, "memory problems" which have caused a lot of anxiety. Symptoms of anxiety include worrying about forgetting things, palpitations. Symptoms of anxiety will occur sporadically, sometimes around 4 am.   She was once managed on Xanax, Ativan, and Diazepam years ago by prior PCP's years ago. She doesn't want to go on a "SSRI" as she's had a negative experience on Zoloft and felt more suicidal on Cymbalta. Anxiety symptoms will occur once monthly lasting several days at a time.   She is not taking anything routinely. She was once taking hydroxyzine 10 mg prescribed by myself, this didn't help.   Past Medical History:  Diagnosis Date  . Acute cystitis 04/15/2020  . Arthritis   . Carpal tunnel syndrome on right 03/15/2017  . Common migraine with intractable migraine 01/25/2018  . Depression   . E. coli UTI (urinary tract infection) 07/04/2018  . Fibromyalgia   . History of chicken pox   . History of shingles 2008  . History of UTI   . Mixed incontinence 08/27/2018  . Seronegative rheumatoid arthritis (HCC)   . Symptom involving bladder 07/04/2018  . 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 Marital status: Married    Spouse name: Not on file  . Number of children: 3  . Years of education: 61  . Highest education level: Not on file  Occupational History  . Occupation: [employer]   Tobacco Use  . Smoking status: Former Smoker    Packs/day: 1.00    Years: 20.00    Pack years: 20.00    Types: Cigarettes    Quit date: 04/20/2018    Years since quitting: 2.7  . Smokeless  tobacco: Never Used  Vaping Use  . Vaping Use: Every day  Substance and Sexual Activity  . Alcohol use: No  . Drug use: No  . Sexual activity: Not on file  Other Topics Concern  . Not on file  Social History Narrative   Lives with husband and kids   Caffeine use: 2 per day   Right handed    Social Determinants of Health   Financial Resource Strain: Not on file  Food Insecurity: Not on file  Transportation Needs: Not on file  Physical Activity: Not on file  Stress: Not on file  Social Connections: Not on file  Intimate Partner Violence: Not on file    Past Surgical History:  Procedure Laterality Date  . CARPAL TUNNEL RELEASE  06/28/2017  . ELBOW ARTHROSCOPY  06/28/2017    Family History  Problem Relation Age of Onset  . Migraines Mother   . Hypertension Mother   . Heart attack Mother   . Nephrolithiasis Mother   . Alcohol abuse Sister   . Depression Sister   . Asthma Son   . Depression Son   . Mental illness Son   . Learning disabilities Son   . Arthritis Maternal Grandmother   . Hearing loss Maternal Grandmother   . Heart attack Maternal Grandfather   . Cancer Paternal Grandmother        Breast  . Hearing loss Paternal  Grandmother   . Heart attack Paternal Grandmother   . Breast cancer Paternal Grandmother   . Heart attack Paternal Grandfather   . Uterine cancer Maternal Aunt     Allergies  Allergen Reactions  . Cymbalta [Duloxetine Hcl]     disorientation  . Iodine Other (See Comments)    Had a vaginal procedure using iodine- her vaginal burned and peeled    Current Outpatient Medications on File Prior to Visit  Medication Sig Dispense Refill  . b complex vitamins capsule Take 1 capsule by mouth daily.    Marland Kitchen CRANBERRY PO Take by mouth.    . cyclobenzaprine (FLEXERIL) 10 MG tablet Take 1 tablet by mouth 3 (three) times daily as needed.    . gabapentin (NEURONTIN) 100 MG capsule Take 100 mg by mouth daily.    Marland Kitchen lisinopril-hydrochlorothiazide  (ZESTORETIC) 10-12.5 MG tablet Take 1 tablet by mouth daily. For blood pressure. 90 tablet 3  . Multiple Vitamins-Minerals (MULTIVITAMIN WITH MINERALS) tablet Take 1 tablet by mouth daily.    . mupirocin ointment (BACTROBAN) 2 % Place 1 application into the nose 2 (two) times daily. 22 g 0  . traMADol (ULTRAM) 50 MG tablet Take 50 mg by mouth every 6 (six) hours as needed for moderate pain.    . vitamin C (ASCORBIC ACID) 500 MG tablet Take 500 mg by mouth 2 (two) times daily.     No current facility-administered medications on file prior to visit.    BP 104/78   Pulse 71   Temp 98.3 F (36.8 C) (Temporal)   Ht 5' 2.5" (1.588 m)   Wt 107 lb (48.5 kg)   SpO2 98%   BMI 19.26 kg/m     Review of Systems  Constitutional: Positive for fever.  Cardiovascular: Positive for palpitations.  Neurological:       "brain fog"  Psychiatric/Behavioral: The patient is nervous/anxious.       Objective:   Physical Exam Constitutional:      Appearance: She is well-nourished.  Cardiovascular:     Rate and Rhythm: Normal rate and regular rhythm.  Pulmonary:     Effort: Pulmonary effort is normal.     Breath sounds: Normal breath sounds.  Musculoskeletal:     Cervical back: Neck supple.  Skin:    General: Skin is warm and dry.  Psychiatric:        Mood and Affect: Mood and affect normal.           Assessment & Plan:      This visit occurred during the SARS-CoV-2 public health emergency.  Safety protocols were in place, including screening questions prior to the visit, additional usage of staff PPE, and extensive cleaning of exam room while observing appropriate contact time as indicated for disinfecting solutions.

## 2021-02-03 NOTE — Assessment & Plan Note (Signed)
Chronic, increased since Covid-19 infection in January 2022.  Discussed options for treatment, she prefers not to take [facility] or daily medication. Discussed that I do not prescribe benzos.   She will try Buspar 5 mg 1-2 times daily. We also try an increased dose of hydroxyzine to 25 mg to use PRN.   She will update.

## 2021-02-03 NOTE — Telephone Encounter (Signed)
I know she was just here, so please apologize for me but we need to see her for general follow up at some point. Can we get her back in this Summer sometime for CPE? I will refill BP med

## 2021-02-03 NOTE — Patient Instructions (Signed)
Start buspirone (Buspar) 5 mg for anxiety. Take 1 tablet by mouth twice daily.   You may take hydroxyzine 25 mg tablets up to twice daily as needed for panic attacks. This may cause drowsiness.   It was a pleasure to see you today!

## 2021-02-03 NOTE — Assessment & Plan Note (Signed)
Lingering symptoms of palpitations, fatigue, fevers, anxiety, "brain fog". Discussed potential longevity of these symptoms, encouraged to stay active.   Will work to treat anxiety.

## 2021-02-04 NOTE — Telephone Encounter (Signed)
Called patient to schedule cpe. Scheduled 6/15.

## 2021-02-25 ENCOUNTER — Other Ambulatory Visit: Payer: Self-pay | Admitting: Primary Care

## 2021-02-25 DIAGNOSIS — F411 Generalized anxiety disorder: Secondary | ICD-10-CM

## 2021-03-22 DIAGNOSIS — M47816 Spondylosis without myelopathy or radiculopathy, lumbar region: Secondary | ICD-10-CM | POA: Diagnosis not present

## 2021-03-22 DIAGNOSIS — M25551 Pain in right hip: Secondary | ICD-10-CM | POA: Diagnosis not present

## 2021-03-22 DIAGNOSIS — M797 Fibromyalgia: Secondary | ICD-10-CM | POA: Diagnosis not present

## 2021-03-22 DIAGNOSIS — M7918 Myalgia, other site: Secondary | ICD-10-CM | POA: Diagnosis not present

## 2021-04-12 ENCOUNTER — Other Ambulatory Visit: Payer: Self-pay | Admitting: Primary Care

## 2021-04-12 DIAGNOSIS — F411 Generalized anxiety disorder: Secondary | ICD-10-CM

## 2021-04-13 NOTE — Telephone Encounter (Signed)
Does she need a refill of buspirone? It looks like I gave her a 90 day supply on 02/25/21, so she should have another month. This would be a little too soon to fill, but if pharmacy wants on file then ok to send as pended.

## 2021-04-14 DIAGNOSIS — S20211A Contusion of right front wall of thorax, initial encounter: Secondary | ICD-10-CM | POA: Diagnosis not present

## 2021-04-14 DIAGNOSIS — W548XXA Other contact with dog, initial encounter: Secondary | ICD-10-CM | POA: Diagnosis not present

## 2021-04-14 DIAGNOSIS — S299XXA Unspecified injury of thorax, initial encounter: Secondary | ICD-10-CM | POA: Diagnosis not present

## 2021-04-15 MED ORDER — BUSPIRONE HCL 10 MG PO TABS: 10.0000 mg | ORAL_TABLET | Freq: Two times a day (BID) | ORAL | 0 refills | Status: DC

## 2021-04-15 NOTE — Telephone Encounter (Signed)
Called patient she did not ask for refill. Wanted to see if she could get increase in dose. States this was discussed at last visit if not helping.she wanted to know if you need to see her for this or if she can make change.

## 2021-04-15 NOTE — Telephone Encounter (Signed)
Called patient reviewed all information and repeated back to me. Will call if any questions.  ? ?

## 2021-04-15 NOTE — Telephone Encounter (Signed)
Yes, we can definitely increase the dose.  I'll send in a Rx for buspirone 10 mg, take 1 tablet by mouth twice daily,

## 2021-04-21 ENCOUNTER — Telehealth: Payer: Self-pay | Admitting: Primary Care

## 2021-04-21 DIAGNOSIS — N6002 Solitary cyst of left breast: Secondary | ICD-10-CM

## 2021-04-21 NOTE — Telephone Encounter (Signed)
Krystal Grant called in due to it is time for mammogram follow up due to they last time theydid a mammogram they found something, and now she is due and in order to get it done she has to get a referral. And they referral is needing to go to [facility]

## 2021-04-22 NOTE — Addendum Note (Signed)
Addended by: Doreene Nest on: 04/22/2021 06:07 AM   Modules accepted: Orders

## 2021-04-22 NOTE — Telephone Encounter (Signed)
Noted, orders placed. 

## 2021-05-10 ENCOUNTER — Ambulatory Visit
Admission: RE | Admit: 2021-05-10 | Discharge: 2021-05-10 | Disposition: A | Discharge status: Home / Self Care | Payer: Medicaid Other | Source: Ambulatory Visit | Attending: Primary Care | Admitting: Primary Care

## 2021-05-10 ENCOUNTER — Other Ambulatory Visit: Payer: Self-pay

## 2021-05-10 DIAGNOSIS — R922 Inconclusive mammogram: Secondary | ICD-10-CM | POA: Diagnosis not present

## 2021-05-10 DIAGNOSIS — N6322 Unspecified lump in the left breast, upper inner quadrant: Secondary | ICD-10-CM | POA: Diagnosis not present

## 2021-05-10 DIAGNOSIS — N6002 Solitary cyst of left breast: Secondary | ICD-10-CM | POA: Diagnosis present

## 2021-05-12 ENCOUNTER — Ambulatory Visit (INDEPENDENT_AMBULATORY_CARE_PROVIDER_SITE_OTHER): Payer: Medicaid Other | Admitting: Primary Care

## 2021-05-12 ENCOUNTER — Encounter: Payer: Self-pay | Admitting: Primary Care

## 2021-05-12 ENCOUNTER — Other Ambulatory Visit: Payer: Self-pay

## 2021-05-12 VITALS — BP 100/76 | HR 62 | Temp 98.4°F | Ht 62.5 in | Wt 111.0 lb

## 2021-05-12 DIAGNOSIS — Z1283 Encounter for screening for malignant neoplasm of skin: Secondary | ICD-10-CM

## 2021-05-12 DIAGNOSIS — Z0000 Encounter for general adult medical examination without abnormal findings: Secondary | ICD-10-CM

## 2021-05-12 DIAGNOSIS — I10 Essential (primary) hypertension: Secondary | ICD-10-CM | POA: Diagnosis not present

## 2021-05-12 DIAGNOSIS — M1990 Unspecified osteoarthritis, unspecified site: Secondary | ICD-10-CM

## 2021-05-12 DIAGNOSIS — N390 Urinary tract infection, site not specified: Secondary | ICD-10-CM

## 2021-05-12 DIAGNOSIS — Z8669 Personal history of other diseases of the nervous system and sense organs: Secondary | ICD-10-CM

## 2021-05-12 DIAGNOSIS — Z1159 Encounter for screening for other viral diseases: Secondary | ICD-10-CM | POA: Diagnosis not present

## 2021-05-12 DIAGNOSIS — F411 Generalized anxiety disorder: Secondary | ICD-10-CM

## 2021-05-12 LAB — COMPREHENSIVE METABOLIC PANEL
ALT: 14 U/L (ref 0–35)
AST: 17 U/L (ref 0–37)
Albumin: 4.4 g/dL (ref 3.5–5.2)
Alkaline Phosphatase: 49 U/L (ref 39–117)
BUN: 19 mg/dL (ref 6–23)
CO2: 29 mEq/L (ref 19–32)
Calcium: 9.6 mg/dL (ref 8.4–10.5)
Chloride: 104 mEq/L (ref 96–112)
Creatinine, Ser: 1.02 mg/dL (ref 0.40–1.20)
GFR: 65.14 mL/min (ref >60.00)
Glucose, Bld: 71 mg/dL (ref 70–99)
Potassium: 4.4 mEq/L (ref 3.5–5.1)
Sodium: 140 mEq/L (ref 135–145)
Total Bilirubin: 0.3 mg/dL (ref 0.2–1.2)
Total Protein: 6.7 g/dL (ref 6.0–8.3)

## 2021-05-12 LAB — CBC
HCT: 30.8 % — ABNORMAL LOW (ref 36.0–46.0)
Hemoglobin: 10.4 g/dL — ABNORMAL LOW (ref 12.0–15.0)
MCHC: 33.9 g/dL (ref 30.0–36.0)
MCV: 82.3 fl (ref 78.0–100.0)
Platelets: 364 10*3/uL (ref 150.0–400.0)
RBC: 3.74 Mil/uL — ABNORMAL LOW (ref 3.87–5.11)
RDW: 15.3 % (ref 11.5–15.5)
WBC: 3.9 10*3/uL — ABNORMAL LOW (ref 4.0–10.5)

## 2021-05-12 LAB — LIPID PANEL
Cholesterol: 229 mg/dL — ABNORMAL HIGH (ref 0–200)
HDL: 78.5 mg/dL (ref >39.00)
LDL Cholesterol: 136 mg/dL — ABNORMAL HIGH (ref 0–99)
NonHDL: 150.54
Total CHOL/HDL Ratio: 3
Triglycerides: 71 mg/dL (ref 0.0–149.0)
VLDL: 14.2 mg/dL (ref 0.0–40.0)

## 2021-05-12 MED ORDER — LISINOPRIL 10 MG PO TABS: 10.0000 mg | ORAL_TABLET | Freq: Every day | ORAL | 3 refills | Status: DC

## 2021-05-12 NOTE — Patient Instructions (Signed)
Stop by the lab prior to leaving today. I will notify you of your results once received.   Stop taking lisinopril-hydrochlorothiazide 10-12.5 mg for blood pressure.   Start taking lisinopril 10 mg for blood pressure.   You will be contacted regarding your referral to dermatology.  Please let us know if you have not been contacted within two weeks.   It was a pleasure to see you!

## 2021-05-12 NOTE — Assessment & Plan Note (Signed)
Immunizations UTD. Mammogram UTD. Pap smear UTD, due in 2023.  Encouraged a healthy diet, regular exercise. Exam today stable. Labs pending.

## 2021-05-12 NOTE — Assessment & Plan Note (Addendum)
Well controlled in the office today, actually too low given medication regimen.   Will stop HCTZ 12.5 mg, continue lisinopril 10 mg, will separate tablets.   CMP pending.

## 2021-05-12 NOTE — Assessment & Plan Note (Signed)
Following with Urology, is managed on trimethoprim 100 mg daily for prevention. Has taken a break for now. Will use PRN.

## 2021-05-12 NOTE — Assessment & Plan Note (Signed)
Improved on increased dose of Buspar 10 mg BID and her hydroxyzine 25 mg PRN. Continue current regimen.

## 2021-05-12 NOTE — Progress Notes (Signed)
Subjective:    Patient ID: Krystal Grant, female    DOB: 01-Oct-1971, 50 y.o.   MRN: [id number]  HPI  Krystal Grant is a very pleasant 50 y.o. female who presents today for complete physical.  She is also requesting a referral to dermatology for skin check and evaluation of a spot.   Immunizations: -Tetanus: 2019 -Influenza: Last season  -Covid-19: 3 vaccines  Diet: She endorses a healthy diet.  Exercise: Active  Eye exam: Completes annually  Dental exam: Completes annually   Pap Smear: Completed in 2020 Mammogram: Completed in June 2022 Colonoscopy: Completed a few years ago in [place]  BP Readings from Last 3 Encounters:  05/12/21 100/76  02/03/21 104/78  12/23/20 97/73       Review of Systems  Constitutional:  Negative for unexpected weight change.  HENT:  Negative for rhinorrhea.   Eyes:  Negative for visual disturbance.  Respiratory:  Negative for shortness of breath.   Cardiovascular:  Negative for chest pain.  Gastrointestinal:  Positive for constipation. Negative for diarrhea.  Genitourinary:  Negative for difficulty urinating.       Perimenopause, hot flashes, mood swings.  Musculoskeletal:  Positive for arthralgias.  Skin:  Negative for rash.  Allergic/Immunologic: Negative for environmental allergies.  Neurological:  Negative for dizziness and headaches.  Psychiatric/Behavioral:         Mood swings, improved on Buspar and hydroxyzine.        Past Medical History:  Diagnosis Date   Acute cystitis 04/15/2020   Arthritis    Carpal tunnel syndrome on right 03/15/2017   Common migraine with intractable migraine 01/25/2018   Depression    E. 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    Social History    Socioeconomic History   Marital status: Married    Spouse name: Not on file   Number of children: 3   Years of education: 12   Highest education level: Not on file  Occupational History   Occupation: [employer]   Tobacco Use   Smoking status: Former    Packs/day: 1.00    Years: 20.00    Pack years: 20.00    Types: Cigarettes    Quit date: 04/20/2018    Years since quitting: 3.0   Smokeless tobacco: Never  Vaping Use   Vaping Use: Every day  Substance and Sexual Activity   Alcohol use: No   Drug use: No   Sexual activity: Not on file  Other Topics Concern   Not on file  Social History Narrative   Lives with husband and kids   Caffeine use: 2 per day   Right handed    Social Determinants of Health   Financial Resource Strain: Not on file  Food Insecurity: Not on file  Transportation Needs: Not on file  Physical Activity: Not on file  Stress: Not on file  Social Connections: Not on file  Intimate Partner Violence: Not on file    Past Surgical History:  Procedure Laterality Date   CARPAL TUNNEL RELEASE  06/28/2017   ELBOW ARTHROSCOPY  06/28/2017    Family History  Problem Relation Age of Onset   Migraines Mother    Hypertension Mother    Heart attack Mother  Nephrolithiasis Mother    Alcohol abuse Sister    Depression Sister    Asthma Son    Depression Son    Mental illness Son    Learning disabilities Son    Arthritis Maternal Grandmother    Hearing loss Maternal Grandmother    Heart attack Maternal Grandfather    Cancer Paternal Grandmother        Breast   Hearing loss Paternal Grandmother    Heart attack Paternal Grandmother    Breast cancer Paternal Grandmother    Heart attack Paternal Grandfather    Uterine cancer Maternal Aunt     Allergies  Allergen Reactions   Cymbalta [Duloxetine Hcl]     disorientation   Iodine Other (See Comments)    Had a vaginal procedure using iodine- her vaginal burned and peeled    Current Outpatient  Medications on File Prior to Visit  Medication Sig Dispense Refill   b complex vitamins capsule Take 1 capsule by mouth daily.     busPIRone (BUSPAR) 10 MG tablet Take 1 tablet (10 mg total) by mouth 2 (two) times daily. For anxiety. 180 tablet 0   CRANBERRY PO Take by mouth.     cyclobenzaprine (FLEXERIL) 10 MG tablet Take 1 tablet by mouth 3 (three) times daily as needed.     gabapentin (NEURONTIN) 100 MG capsule Take 100 mg by mouth daily.     hydrOXYzine (VISTARIL) 25 MG capsule TAKE 1 CAPSULE BY MOUTH 2 TIMES DAILY AS NEEDED FOR ANXIETY. 180 capsule 0   lisinopril-hydrochlorothiazide (ZESTORETIC) 10-12.5 MG tablet TAKE 1 TABLET BY MOUTH DAILY FOR BLOOD PRESSURE 90 tablet 0   Multiple Vitamins-Minerals (MULTIVITAMIN WITH MINERALS) tablet Take 1 tablet by mouth daily.     mupirocin ointment (BACTROBAN) 2 % Place 1 application into the nose 2 (two) times daily. 22 g 0   traMADol (ULTRAM) 50 MG tablet Take 50 mg by mouth every 6 (six) hours as needed for moderate pain.     vitamin C (ASCORBIC ACID) 500 MG tablet Take 500 mg by mouth 2 (two) times daily.     trimethoprim (TRIMPEX) 100 MG tablet Take 100 mg by mouth daily.     No current facility-administered medications on file prior to visit.    BP 100/76   Pulse 62   Temp 98.4 F (36.9 C) (Temporal)   Ht 5' 2.5" (1.588 m)   Wt 111 lb (50.3 kg)   SpO2 100%   BMI 19.98 kg/m  Objective:   Physical Exam HENT:     Right Ear: Tympanic membrane and ear canal normal.     Left Ear: Tympanic membrane and ear canal normal.     Nose: Nose normal.  Eyes:     Conjunctiva/sclera: Conjunctivae normal.     Pupils: Pupils are equal, round, and reactive to light.  Neck:     Thyroid: No thyromegaly.  Cardiovascular:     Rate and Rhythm: Normal rate and regular rhythm.     Heart sounds: No murmur heard. Pulmonary:     Effort: Pulmonary effort is normal.     Breath sounds: Normal breath sounds. No rales.  Abdominal:     General: Bowel sounds  are normal.     Palpations: Abdomen is soft.     Tenderness: There is no abdominal tenderness.  Musculoskeletal:        General: Normal range of motion.     Cervical back: Neck supple.  Lymphadenopathy:     Cervical: No cervical  adenopathy.  Skin:    General: Skin is warm and dry.     Findings: No rash.  Neurological:     Mental Status: She is alert and oriented to person, place, and time.     Cranial Nerves: No cranial nerve deficit.     Deep Tendon Reflexes: Reflexes are normal and symmetric.  Psychiatric:        Mood and Affect: Mood normal.          Assessment & Plan:      This visit occurred during the SARS-CoV-2 public health emergency.  Safety protocols were in place, including screening questions prior to the visit, additional usage of staff PPE, and extensive cleaning of exam room while observing appropriate contact time as indicated for disinfecting solutions.

## 2021-05-12 NOTE — Assessment & Plan Note (Signed)
Denies concerns today. °Continue to monitor. °

## 2021-05-12 NOTE — Assessment & Plan Note (Signed)
Following with pain management, doing well on gabapentin 100 mg, Flexeril 10 mg, and Tramadol 50 mg all PRN.  Continue same.

## 2021-05-13 LAB — HEPATITIS C ANTIBODY
Hepatitis C Ab: NONREACTIVE
SIGNAL TO CUT-OFF: 0.01 (ref <1.00)

## 2021-05-25 ENCOUNTER — Other Ambulatory Visit: Payer: Self-pay | Admitting: Primary Care

## 2021-05-25 DIAGNOSIS — F411 Generalized anxiety disorder: Secondary | ICD-10-CM

## 2021-05-25 DIAGNOSIS — I10 Essential (primary) hypertension: Secondary | ICD-10-CM

## 2021-05-28 ENCOUNTER — Telehealth: Payer: Self-pay

## 2021-05-28 NOTE — Telephone Encounter (Signed)
Spoke with patient. [facility] office notified us by fax that patient's insurance has [facility] provider assigned instead of [employer]. They can not process referral when the names are not matching. Patient advised she needs to contact Medicaid and have this corrected before she can get an appointment.  Patient was advised to let me know when this was done so we can proceed with scheduling and I can notify [facility].

## 2021-06-01 DIAGNOSIS — M791 Myalgia, unspecified site: Secondary | ICD-10-CM | POA: Diagnosis not present

## 2021-06-01 DIAGNOSIS — M797 Fibromyalgia: Secondary | ICD-10-CM | POA: Diagnosis not present

## 2021-06-01 DIAGNOSIS — M25551 Pain in right hip: Secondary | ICD-10-CM | POA: Diagnosis not present

## 2021-06-01 DIAGNOSIS — M47816 Spondylosis without myelopathy or radiculopathy, lumbar region: Secondary | ICD-10-CM | POA: Diagnosis not present

## 2021-06-03 NOTE — Telephone Encounter (Signed)
I called and left message for [facility] rep to call me back to let them know and if there is anything else we need to do on our end.

## 2021-06-03 NOTE — Telephone Encounter (Signed)
Pt called in and stated the insurance has fixed this on their side.

## 2021-06-08 NOTE — Telephone Encounter (Signed)
I called [facility] office again and could not get through to the referral representative. I sent referral and message through [employer] to their office about this. Waiting for response.

## 2021-06-15 NOTE — Telephone Encounter (Signed)
Spoke with Maralyn Sago at [facility] office and [place] tracks still shows [facility] as primary care for patient. I called and left message for the patient to let her know that this was still not updated for some reason with insurance and to follow up with them on this and then let me know the update when she can.

## 2021-06-21 NOTE — Telephone Encounter (Signed)
Spoke with [employer] insurance and was advised that on their end they did update PCP to [facility] [facility] [employer] but they do not handle [place] tracks and that would be a separate department to contact. I have not had any luck in doing that so far, will try again later today. Phone number [phone]

## 2021-06-24 ENCOUNTER — Ambulatory Visit (INDEPENDENT_AMBULATORY_CARE_PROVIDER_SITE_OTHER): Payer: Medicaid Other | Admitting: Primary Care

## 2021-06-24 ENCOUNTER — Encounter: Payer: Self-pay | Admitting: Primary Care

## 2021-06-24 ENCOUNTER — Other Ambulatory Visit: Payer: Self-pay

## 2021-06-24 VITALS — BP 110/62 | HR 100 | Temp 98.1°F | Ht 62.5 in | Wt 118.0 lb

## 2021-06-24 DIAGNOSIS — M5442 Lumbago with sciatica, left side: Secondary | ICD-10-CM

## 2021-06-24 DIAGNOSIS — M5441 Lumbago with sciatica, right side: Secondary | ICD-10-CM | POA: Diagnosis not present

## 2021-06-24 DIAGNOSIS — M2550 Pain in unspecified joint: Secondary | ICD-10-CM

## 2021-06-24 DIAGNOSIS — G8929 Other chronic pain: Secondary | ICD-10-CM

## 2021-06-24 MED ORDER — PREDNISONE 20 MG PO TABS: ORAL_TABLET | ORAL | 0 refills | Status: DC

## 2021-06-24 NOTE — Patient Instructions (Signed)
Start prednisone 20 mg tablets for back/hip pain. Take 3 tablets by mouth once daily for two days, then 2 tablets once daily for four days, then 1 tablet once daily for four days.  You will be contacted regarding your referral for the genetic testing.  Please let us know if you have not been contacted within two weeks.   It was a pleasure to see you today!

## 2021-06-24 NOTE — Progress Notes (Signed)
Subjective:    Patient ID: Krystal Grant, female    DOB: 01-13-1971, 50 y.o.   MRN: [id number]  HPI  Krystal Grant is a very pleasant 50 y.o. female with a history of arthritis, lumbar spondylosis, fibromyalgia who presents today to discuss back/hip pain.  Following with [facility] for chronic arthritis, is managed on Tramadol 50 mg, gabapentin 100 mg, and cyclobenzaprine 10 mg as needed.   Her pain is located to the bilateral lower back and hips, left greater than right with radiation of pain down the anterior thigh around the back of her calf and plantar toes. Muscle spasms down the legs with weakness. This began about two weeks ago, increased over the last three days, is some better today. Pain is worse with sitting and application of heat.   She denies loss of bowel/bladder control, injury/trauma, She is taking Ibuprofen 400 mg every 6 hours and 800 mg at bedtime with little improvement. She has been taking her Tramadol, cyclobenzaprine, and gabapentin without improvement.   She endorses that she was diagnosed with "rheumatoid arthritis" in [place], underwent treatment with methotrexate without improvement. Upon moving here she saw rheumatology who didn't believe that she had seronegative rheumatoid arthritis.   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 She would like to see Dr. Peggye Form through [facility].    Review of Systems  Genitourinary:        No loss of bowel/bladder control.   Musculoskeletal:  Positive for arthralgias.  Neurological:  Positive for weakness. Negative for numbness.        Past Medical History:  Diagnosis Date   Acute cystitis 04/15/2020   Arthritis    Carpal tunnel syndrome on right 03/15/2017   Common migraine with intractable  migraine 01/25/2018   Depression    E. 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    Social History   Socioeconomic History   Marital status: Married    Spouse name: Not on file   Number of children: 3   Years of education: 12   Highest education level: Not on file  Occupational History   Occupation: [employer]   Tobacco Use   Smoking status: Former    Packs/day: 1.00    Years: 20.00    Pack years: 20.00    Types: Cigarettes    Quit date: 04/20/2018    Years since quitting: 3.1   Smokeless tobacco: Never  Vaping Use   Vaping Use: Every day  Substance and Sexual Activity   Alcohol use: No   Drug use: No   Sexual activity: Not on file  Other Topics Concern   Not on file  Social History Narrative   Lives with husband and kids   Caffeine use: 2 per day   Right handed    Social Determinants of Health   Financial Resource Strain: Not on file  Food Insecurity: Not on file  Transportation Needs: Not on file  Physical Activity: Not on file  Stress: Not on file  Social Connections: Not on file  Intimate Partner  Violence: Not on file    Past Surgical History:  Procedure Laterality Date   CARPAL TUNNEL RELEASE  06/28/2017   ELBOW ARTHROSCOPY  06/28/2017    Family History  Problem Relation Age of Onset   Migraines Mother    Hypertension Mother    Heart attack Mother    Nephrolithiasis Mother    Alcohol abuse Sister    Depression Sister    Asthma Son    Depression Son    Mental illness Son    Learning disabilities Son    Arthritis Maternal Grandmother    Hearing loss Maternal Grandmother    Heart attack Maternal Grandfather    Cancer Paternal Grandmother        Breast   Hearing loss Paternal Grandmother    Heart attack  Paternal Grandmother    Breast cancer Paternal Grandmother    Heart attack Paternal Grandfather    Uterine cancer Maternal Aunt     Allergies  Allergen Reactions   Cymbalta [Duloxetine Hcl]     disorientation   Iodine Other (See Comments)    Had a vaginal procedure using iodine- her vaginal burned and peeled    Current Outpatient Medications on File Prior to Visit  Medication Sig Dispense Refill   b complex vitamins capsule Take 1 capsule by mouth daily.     busPIRone (BUSPAR) 10 MG tablet Take 1 tablet (10 mg total) by mouth 2 (two) times daily. For anxiety. 180 tablet 0   CRANBERRY PO Take by mouth.     cyclobenzaprine (FLEXERIL) 10 MG tablet Take 1 tablet by mouth 3 (three) times daily as needed.     gabapentin (NEURONTIN) 100 MG capsule Take 100 mg by mouth daily.     hydrOXYzine (VISTARIL) 25 MG capsule TAKE 1 CAPSULE BY MOUTH 2 TIMES DAILY AS NEEDED FOR ANXIETY. 180 capsule 0   lisinopril (ZESTRIL) 10 MG tablet Take 1 tablet (10 mg total) by mouth daily. For blood pressure. 90 tablet 3   Multiple Vitamins-Minerals (MULTIVITAMIN WITH MINERALS) tablet Take 1 tablet by mouth daily.     mupirocin ointment (BACTROBAN) 2 % Place 1 application into the nose 2 (two) times daily. 22 g 0   traMADol (ULTRAM) 50 MG tablet Take 50 mg by mouth every 6 (six) hours as needed for moderate pain.     trimethoprim (TRIMPEX) 100 MG tablet Take 100 mg by mouth daily.     vitamin C (ASCORBIC ACID) 500 MG tablet Take 500 mg by mouth 2 (two) times daily.     No current facility-administered medications on file prior to visit.    BP 110/62   Pulse 100   Temp 98.1 F (36.7 C) (Temporal)   Ht 5' 2.5" (1.588 m)   Wt 118 lb (53.5 kg)   SpO2 100%   BMI 21.24 kg/m  Objective:   Physical Exam Pulmonary:     Effort: Pulmonary effort is normal.  Musculoskeletal:     Lumbar back: No tenderness or bony tenderness. Negative right straight leg raise test and negative left straight leg raise test.        Back:     Comments: 5/5 strength to bilateral lower extremities.   Skin:    General: Skin is warm and dry.  Neurological:     Mental Status: She is alert.          Assessment & Plan:      This visit occurred during the SARS-CoV-2 public health emergency.  Safety protocols were in  place, including screening questions prior to the visit, additional usage of staff PPE, and extensive cleaning of exam room while observing appropriate contact time as indicated for disinfecting solutions.

## 2021-06-24 NOTE — Assessment & Plan Note (Signed)
Acute on chronic flare, does follow with [facility]. No improvement with current regimen.  Rx for prednisone course sent to pharmacy.  She will follow up with neurosurgery.

## 2021-06-24 NOTE — Telephone Encounter (Signed)
Could not get through to speak to anyone with [facility]. Faxing information and request to update patient's PCP on their website. FAX: [phone] waiting on feedback

## 2021-06-24 NOTE — Assessment & Plan Note (Signed)
Referral placed to [facility] for [employer] Danlos Syndrome testing per patient request.

## 2021-06-27 ENCOUNTER — Other Ambulatory Visit: Payer: Self-pay | Admitting: Primary Care

## 2021-06-27 DIAGNOSIS — F411 Generalized anxiety disorder: Secondary | ICD-10-CM

## 2021-06-28 ENCOUNTER — Other Ambulatory Visit: Payer: Self-pay | Admitting: Primary Care

## 2021-06-28 DIAGNOSIS — F411 Generalized anxiety disorder: Secondary | ICD-10-CM

## 2021-07-01 NOTE — Telephone Encounter (Signed)
Spoke with Maralyn Sago at [facility] office and she looked on [employer], provider has been corrected and updated on their end now. Maralyn Sago will call patient to get her scheduled

## 2021-07-16 ENCOUNTER — Other Ambulatory Visit: Payer: Self-pay | Admitting: Primary Care

## 2021-07-16 DIAGNOSIS — F411 Generalized anxiety disorder: Secondary | ICD-10-CM

## 2021-07-16 NOTE — Telephone Encounter (Signed)
Up to date on all her visits 90 day called in

## 2021-07-26 ENCOUNTER — Telehealth: Payer: Self-pay

## 2021-07-26 NOTE — Telephone Encounter (Signed)
Returned all from voice mail. Patient started with UTI symptoms today and wanted to have something called in. Informed that we will need to see and run test for medications to be called in. I have made appointment for 8/30. She will call urology and see if they can call something in without visit. She will call and c/a appointment if so.

## 2021-07-27 ENCOUNTER — Ambulatory Visit: Payer: Medicaid Other | Admitting: Primary Care

## 2021-07-27 NOTE — Telephone Encounter (Signed)
Noted, no appt scheduled as it looks like she cancelled.

## 2021-08-10 ENCOUNTER — Other Ambulatory Visit: Payer: Self-pay | Admitting: Primary Care

## 2021-08-10 DIAGNOSIS — F411 Generalized anxiety disorder: Secondary | ICD-10-CM

## 2021-08-17 ENCOUNTER — Other Ambulatory Visit: Payer: Self-pay | Admitting: Neurosurgery

## 2021-08-17 DIAGNOSIS — M47816 Spondylosis without myelopathy or radiculopathy, lumbar region: Secondary | ICD-10-CM

## 2021-08-17 DIAGNOSIS — M791 Myalgia, unspecified site: Secondary | ICD-10-CM | POA: Diagnosis not present

## 2021-08-17 DIAGNOSIS — M542 Cervicalgia: Secondary | ICD-10-CM | POA: Diagnosis not present

## 2021-08-17 DIAGNOSIS — M7918 Myalgia, other site: Secondary | ICD-10-CM | POA: Diagnosis not present

## 2021-09-18 ENCOUNTER — Encounter
Admission: EM | Disposition: A | Discharge status: Home / Self Care | Payer: Self-pay | Source: Home / Self Care | Attending: Emergency Medicine

## 2021-09-18 ENCOUNTER — Emergency Department: Payer: Medicaid Other

## 2021-09-18 ENCOUNTER — Observation Stay
Admission: EM | Admit: 2021-09-18 | Discharge: 2021-09-19 | Disposition: A | Discharge status: Home / Self Care | Payer: Medicaid Other | Attending: Surgery | Admitting: Surgery

## 2021-09-18 ENCOUNTER — Observation Stay: Payer: Medicaid Other | Admitting: Certified Registered"

## 2021-09-18 ENCOUNTER — Other Ambulatory Visit: Payer: Self-pay

## 2021-09-18 ENCOUNTER — Encounter: Payer: Self-pay | Admitting: Emergency Medicine

## 2021-09-18 DIAGNOSIS — Z87891 Personal history of nicotine dependence: Secondary | ICD-10-CM | POA: Diagnosis not present

## 2021-09-18 DIAGNOSIS — Z20822 Contact with and (suspected) exposure to covid-19: Secondary | ICD-10-CM | POA: Diagnosis not present

## 2021-09-18 DIAGNOSIS — N200 Calculus of kidney: Secondary | ICD-10-CM | POA: Diagnosis not present

## 2021-09-18 DIAGNOSIS — Z8616 Personal history of COVID-19: Secondary | ICD-10-CM | POA: Diagnosis not present

## 2021-09-18 DIAGNOSIS — K3580 Unspecified acute appendicitis: Secondary | ICD-10-CM | POA: Diagnosis not present

## 2021-09-18 DIAGNOSIS — K353 Acute appendicitis with localized peritonitis, without perforation or gangrene: Secondary | ICD-10-CM | POA: Diagnosis not present

## 2021-09-18 DIAGNOSIS — K37 Unspecified appendicitis: Secondary | ICD-10-CM | POA: Diagnosis not present

## 2021-09-18 DIAGNOSIS — I700 Atherosclerosis of aorta: Secondary | ICD-10-CM | POA: Diagnosis not present

## 2021-09-18 DIAGNOSIS — R1084 Generalized abdominal pain: Secondary | ICD-10-CM | POA: Diagnosis not present

## 2021-09-18 DIAGNOSIS — R1031 Right lower quadrant pain: Secondary | ICD-10-CM | POA: Diagnosis present

## 2021-09-18 DIAGNOSIS — Z79899 Other long term (current) drug therapy: Secondary | ICD-10-CM | POA: Insufficient documentation

## 2021-09-18 HISTORY — DX: Unspecified acute appendicitis: K35.80

## 2021-09-18 HISTORY — PX: XI ROBOTIC LAPAROSCOPIC ASSISTED APPENDECTOMY: SHX6877

## 2021-09-18 LAB — URINALYSIS, ROUTINE W REFLEX MICROSCOPIC
Bilirubin Urine: NEGATIVE
Glucose, UA: NEGATIVE mg/dL
Ketones, ur: NEGATIVE mg/dL
Leukocytes,Ua: NEGATIVE
Nitrite: NEGATIVE
Protein, ur: NEGATIVE mg/dL
Specific Gravity, Urine: 1.016 (ref 1.005–1.030)
pH: 5 (ref 5.0–8.0)

## 2021-09-18 LAB — CBC
HCT: 31.8 % — ABNORMAL LOW (ref 36.0–46.0)
HCT: 29.6 % — ABNORMAL LOW (ref 36.0–46.0)
Hemoglobin: 10.7 g/dL — ABNORMAL LOW (ref 12.0–15.0)
Hemoglobin: 9.9 g/dL — ABNORMAL LOW (ref 12.0–15.0)
MCH: 27.9 pg (ref 26.0–34.0)
MCH: 27.4 pg (ref 26.0–34.0)
MCHC: 33.6 g/dL (ref 30.0–36.0)
MCHC: 33.4 g/dL (ref 30.0–36.0)
MCV: 83 fL (ref 80.0–100.0)
MCV: 82 fL (ref 80.0–100.0)
Platelets: 362 10*3/uL (ref 150–400)
Platelets: 313 10*3/uL (ref 150–400)
RBC: 3.83 MIL/uL — ABNORMAL LOW (ref 3.87–5.11)
RBC: 3.61 MIL/uL — ABNORMAL LOW (ref 3.87–5.11)
RDW: 14.6 % (ref 11.5–15.5)
RDW: 14.8 % (ref 11.5–15.5)
WBC: 13.3 10*3/uL — ABNORMAL HIGH (ref 4.0–10.5)
WBC: 11.8 10*3/uL — ABNORMAL HIGH (ref 4.0–10.5)
nRBC: 0 % (ref 0.0–0.2)
nRBC: 0 % (ref 0.0–0.2)

## 2021-09-18 LAB — COMPREHENSIVE METABOLIC PANEL
ALT: 14 U/L (ref 0–44)
AST: 20 U/L (ref 15–41)
Albumin: 4.2 g/dL (ref 3.5–5.0)
Alkaline Phosphatase: 41 U/L (ref 38–126)
Anion gap: 6 (ref 5–15)
BUN: 18 mg/dL (ref 6–20)
CO2: 27 mmol/L (ref 22–32)
Calcium: 9.3 mg/dL (ref 8.9–10.3)
Chloride: 104 mmol/L (ref 98–111)
Creatinine, Ser: 1.11 mg/dL — ABNORMAL HIGH (ref 0.44–1.00)
GFR, Estimated: >60 mL/min (ref >60)
Glucose, Bld: 118 mg/dL — ABNORMAL HIGH (ref 70–99)
Potassium: 4.3 mmol/L (ref 3.5–5.1)
Sodium: 137 mmol/L (ref 135–145)
Total Bilirubin: 0.7 mg/dL (ref 0.3–1.2)
Total Protein: 7.4 g/dL (ref 6.5–8.1)

## 2021-09-18 LAB — CBC WITH DIFFERENTIAL/PLATELET
Abs Immature Granulocytes: 0.06 10*3/uL (ref 0.00–0.07)
Basophils Absolute: 0.1 10*3/uL (ref 0.0–0.1)
Basophils Relative: 1 %
Eosinophils Absolute: 0.1 10*3/uL (ref 0.0–0.5)
Eosinophils Relative: 1 %
HCT: 32.1 % — ABNORMAL LOW (ref 36.0–46.0)
Hemoglobin: 10.8 g/dL — ABNORMAL LOW (ref 12.0–15.0)
Immature Granulocytes: 0 %
Lymphocytes Relative: 13 %
Lymphs Abs: 1.8 10*3/uL (ref 0.7–4.0)
MCH: 28 pg (ref 26.0–34.0)
MCHC: 33.6 g/dL (ref 30.0–36.0)
MCV: 83.2 fL (ref 80.0–100.0)
Monocytes Absolute: 0.6 10*3/uL (ref 0.1–1.0)
Monocytes Relative: 5 %
Neutro Abs: 11 10*3/uL — ABNORMAL HIGH (ref 1.7–7.7)
Neutrophils Relative %: 80 %
Platelets: 394 10*3/uL (ref 150–400)
RBC: 3.86 MIL/uL — ABNORMAL LOW (ref 3.87–5.11)
RDW: 14.7 % (ref 11.5–15.5)
WBC: 13.7 10*3/uL — ABNORMAL HIGH (ref 4.0–10.5)
nRBC: 0 % (ref 0.0–0.2)

## 2021-09-18 LAB — RESP PANEL BY RT-PCR (FLU A&B, COVID) ARPGX2
Influenza A by PCR: NEGATIVE
Influenza B by PCR: NEGATIVE
SARS Coronavirus 2 by RT PCR: NEGATIVE

## 2021-09-18 LAB — CREATININE, SERUM
Creatinine, Ser: 0.99 mg/dL (ref 0.44–1.00)
GFR, Estimated: >60 mL/min (ref >60)

## 2021-09-18 LAB — LIPASE, BLOOD: Lipase: 32 U/L (ref 11–51)

## 2021-09-18 LAB — POC URINE PREG, ED: Preg Test, Ur: NEGATIVE

## 2021-09-18 SURGERY — APPENDECTOMY, ROBOT-ASSISTED, LAPAROSCOPIC
Anesthesia: General | Site: Abdomen

## 2021-09-18 MED ORDER — PROPOFOL 10 MG/ML IV BOLUS
INTRAVENOUS | Status: AC
  Filled 2021-09-18: qty 20

## 2021-09-18 MED ORDER — OXYCODONE HCL 5 MG PO TABS: 5.0000 mg | ORAL_TABLET | Freq: Once | ORAL | Status: DC | PRN

## 2021-09-18 MED ORDER — FENTANYL CITRATE (PF) 100 MCG/2ML IJ SOLN
INTRAMUSCULAR | Status: DC | PRN
  Administered 2021-09-18: 100 ug via INTRAVENOUS

## 2021-09-18 MED ORDER — SODIUM CHLORIDE 0.9 % IV SOLN
2.0000 g | INTRAVENOUS | Status: DC
  Administered 2021-09-18: 2 g via INTRAVENOUS
  Filled 2021-09-18 (×3): qty 20

## 2021-09-18 MED ORDER — HYDROXYZINE PAMOATE 25 MG PO CAPS
25.0000 mg | ORAL_CAPSULE | Freq: Two times a day (BID) | ORAL | Status: DC | PRN
  Filled 2021-09-18: qty 1

## 2021-09-18 MED ORDER — 0.9 % SODIUM CHLORIDE (POUR BTL) OPTIME
TOPICAL | Status: DC | PRN
  Administered 2021-09-18: 500 mL

## 2021-09-18 MED ORDER — TRIMETHOPRIM 100 MG PO TABS
100.0000 mg | ORAL_TABLET | Freq: Every day | ORAL | Status: DC
  Administered 2021-09-18 – 2021-09-19 (×2): 100 mg via ORAL
  Filled 2021-09-18 (×2): qty 1

## 2021-09-18 MED ORDER — HYDROCODONE-ACETAMINOPHEN 5-325 MG PO TABS
1.0000 | ORAL_TABLET | ORAL | Status: DC | PRN
  Administered 2021-09-18 – 2021-09-19 (×4): 2 via ORAL
  Administered 2021-09-19: 1 via ORAL
  Filled 2021-09-18: qty 1
  Filled 2021-09-18 (×4): qty 2

## 2021-09-18 MED ORDER — ONDANSETRON HCL 4 MG/2ML IJ SOLN
INTRAMUSCULAR | Status: AC
  Filled 2021-09-18: qty 2

## 2021-09-18 MED ORDER — HEMOSTATIC AGENTS (NO CHARGE) OPTIME
TOPICAL | Status: DC | PRN
  Administered 2021-09-18 (×2): 1 via TOPICAL

## 2021-09-18 MED ORDER — LISINOPRIL 10 MG PO TABS: 10.0000 mg | ORAL_TABLET | Freq: Every day | ORAL | Status: DC

## 2021-09-18 MED ORDER — LIDOCAINE HCL (CARDIAC) PF 100 MG/5ML IV SOSY
PREFILLED_SYRINGE | INTRAVENOUS | Status: DC | PRN
  Administered 2021-09-18: 60 mg via INTRAVENOUS

## 2021-09-18 MED ORDER — SEVOFLURANE IN SOLN
RESPIRATORY_TRACT | Status: AC
  Filled 2021-09-18: qty 250

## 2021-09-18 MED ORDER — FENTANYL CITRATE (PF) 100 MCG/2ML IJ SOLN
INTRAMUSCULAR | Status: AC
  Administered 2021-09-18: 25 ug via INTRAVENOUS
  Filled 2021-09-18: qty 2

## 2021-09-18 MED ORDER — APREPITANT 40 MG PO CAPS: 40.0000 mg | ORAL_CAPSULE | Freq: Once | ORAL | Status: AC

## 2021-09-18 MED ORDER — MORPHINE SULFATE (PF) 4 MG/ML IV SOLN
4.0000 mg | Freq: Once | INTRAVENOUS | Status: AC
  Administered 2021-09-18: 4 mg via INTRAVENOUS
  Filled 2021-09-18: qty 1

## 2021-09-18 MED ORDER — DROPERIDOL 2.5 MG/ML IJ SOLN
0.6250 mg | Freq: Once | INTRAMUSCULAR | Status: DC | PRN
  Filled 2021-09-18: qty 2

## 2021-09-18 MED ORDER — GABAPENTIN 100 MG PO CAPS
100.0000 mg | ORAL_CAPSULE | Freq: Every day | ORAL | Status: DC
  Administered 2021-09-18 – 2021-09-19 (×2): 100 mg via ORAL
  Filled 2021-09-18 (×2): qty 1

## 2021-09-18 MED ORDER — MIDAZOLAM HCL 2 MG/2ML IJ SOLN
INTRAMUSCULAR | Status: DC | PRN
  Administered 2021-09-18: 2 mg via INTRAVENOUS

## 2021-09-18 MED ORDER — LACTATED RINGERS IV SOLN: INTRAVENOUS | Status: DC

## 2021-09-18 MED ORDER — ONDANSETRON HCL 4 MG/2ML IJ SOLN: 4.0000 mg | Freq: Four times a day (QID) | INTRAMUSCULAR | Status: DC | PRN

## 2021-09-18 MED ORDER — PROPOFOL 10 MG/ML IV BOLUS
INTRAVENOUS | Status: DC | PRN
  Administered 2021-09-18: 120 mg via INTRAVENOUS

## 2021-09-18 MED ORDER — ACETAMINOPHEN 10 MG/ML IV SOLN
INTRAVENOUS | Status: AC
  Filled 2021-09-18: qty 100

## 2021-09-18 MED ORDER — ONDANSETRON HCL 4 MG/2ML IJ SOLN
4.0000 mg | Freq: Once | INTRAMUSCULAR | Status: AC
  Administered 2021-09-18: 4 mg via INTRAVENOUS
  Filled 2021-09-18: qty 2

## 2021-09-18 MED ORDER — SUGAMMADEX SODIUM 200 MG/2ML IV SOLN
INTRAVENOUS | Status: DC | PRN
  Administered 2021-09-18: 150 mg via INTRAVENOUS

## 2021-09-18 MED ORDER — MORPHINE SULFATE (PF) 4 MG/ML IV SOLN
4.0000 mg | Freq: Once | INTRAVENOUS | Status: AC
Start: 2021-09-18 — End: 2021-09-18
  Administered 2021-09-18: 4 mg via INTRAVENOUS
  Filled 2021-09-18: qty 1

## 2021-09-18 MED ORDER — DOCUSATE SODIUM 100 MG PO CAPS: 100.0000 mg | ORAL_CAPSULE | Freq: Two times a day (BID) | ORAL | Status: DC | PRN

## 2021-09-18 MED ORDER — ENOXAPARIN SODIUM 40 MG/0.4ML IJ SOSY
40.0000 mg | PREFILLED_SYRINGE | INTRAMUSCULAR | Status: DC
  Administered 2021-09-19: 40 mg via SUBCUTANEOUS
  Filled 2021-09-18: qty 0.4

## 2021-09-18 MED ORDER — PROMETHAZINE HCL 25 MG/ML IJ SOLN: 6.2500 mg | INTRAMUSCULAR | Status: DC | PRN

## 2021-09-18 MED ORDER — BUSPIRONE HCL 10 MG PO TABS
10.0000 mg | ORAL_TABLET | Freq: Two times a day (BID) | ORAL | Status: DC
  Administered 2021-09-18 – 2021-09-19 (×2): 10 mg via ORAL
  Filled 2021-09-18 (×3): qty 1

## 2021-09-18 MED ORDER — CYCLOBENZAPRINE HCL 10 MG PO TABS: 10.0000 mg | ORAL_TABLET | Freq: Three times a day (TID) | ORAL | Status: DC | PRN

## 2021-09-18 MED ORDER — LIDOCAINE-EPINEPHRINE (PF) 1 %-1:200000 IJ SOLN
INTRAMUSCULAR | Status: DC | PRN
  Administered 2021-09-18: 15 mL

## 2021-09-18 MED ORDER — APREPITANT 40 MG PO CAPS
ORAL_CAPSULE | ORAL | Status: AC
  Administered 2021-09-18: 40 mg via ORAL
  Filled 2021-09-18: qty 1

## 2021-09-18 MED ORDER — ACETAMINOPHEN 10 MG/ML IV SOLN: 1000.0000 mg | Freq: Once | INTRAVENOUS | Status: DC | PRN

## 2021-09-18 MED ORDER — FENTANYL CITRATE (PF) 100 MCG/2ML IJ SOLN: 25.0000 ug | INTRAMUSCULAR | Status: DC | PRN

## 2021-09-18 MED ORDER — ROCURONIUM BROMIDE 100 MG/10ML IV SOLN
INTRAVENOUS | Status: DC | PRN
  Administered 2021-09-18: 60 mg via INTRAVENOUS

## 2021-09-18 MED ORDER — KETOROLAC TROMETHAMINE 30 MG/ML IJ SOLN
INTRAMUSCULAR | Status: AC
  Filled 2021-09-18: qty 1

## 2021-09-18 MED ORDER — MIDAZOLAM HCL 2 MG/2ML IJ SOLN
INTRAMUSCULAR | Status: AC
  Filled 2021-09-18: qty 2

## 2021-09-18 MED ORDER — KETOROLAC TROMETHAMINE 30 MG/ML IJ SOLN
INTRAMUSCULAR | Status: DC | PRN
  Administered 2021-09-18: 30 mg via INTRAVENOUS

## 2021-09-18 MED ORDER — DEXAMETHASONE SODIUM PHOSPHATE 10 MG/ML IJ SOLN
INTRAMUSCULAR | Status: DC | PRN
  Administered 2021-09-18: 10 mg via INTRAVENOUS

## 2021-09-18 MED ORDER — TRAMADOL HCL 50 MG PO TABS
50.0000 mg | ORAL_TABLET | Freq: Four times a day (QID) | ORAL | Status: DC | PRN
  Administered 2021-09-18: 50 mg via ORAL
  Filled 2021-09-18: qty 1

## 2021-09-18 MED ORDER — METRONIDAZOLE 500 MG/100ML IV SOLN
500.0000 mg | Freq: Two times a day (BID) | INTRAVENOUS | Status: DC
  Administered 2021-09-18 (×2): 500 mg via INTRAVENOUS
  Filled 2021-09-18 (×4): qty 100

## 2021-09-18 MED ORDER — PHENYLEPHRINE HCL-NACL 20-0.9 MG/250ML-% IV SOLN
INTRAVENOUS | Status: DC | PRN
  Administered 2021-09-18: 50 ug/min via INTRAVENOUS

## 2021-09-18 MED ORDER — ACETAMINOPHEN 10 MG/ML IV SOLN
INTRAVENOUS | Status: DC | PRN
  Administered 2021-09-18: 1000 mg via INTRAVENOUS

## 2021-09-18 MED ORDER — OXYCODONE HCL 5 MG/5ML PO SOLN
5.0000 mg | Freq: Once | ORAL | Status: DC | PRN
Start: 2021-09-18 — End: 2021-09-18

## 2021-09-18 MED ORDER — ONDANSETRON 4 MG PO TBDP
4.0000 mg | ORAL_TABLET | Freq: Four times a day (QID) | ORAL | Status: DC | PRN
  Administered 2021-09-18: 4 mg via ORAL
  Filled 2021-09-18: qty 1

## 2021-09-18 MED ORDER — MORPHINE SULFATE (PF) 2 MG/ML IV SOLN
2.0000 mg | INTRAVENOUS | Status: DC | PRN
  Administered 2021-09-18: 2 mg via INTRAVENOUS
  Filled 2021-09-18: qty 1

## 2021-09-18 MED ORDER — ONDANSETRON HCL 4 MG/2ML IJ SOLN
INTRAMUSCULAR | Status: DC | PRN
  Administered 2021-09-18: 4 mg via INTRAVENOUS

## 2021-09-18 MED ORDER — FENTANYL CITRATE (PF) 100 MCG/2ML IJ SOLN
INTRAMUSCULAR | Status: AC
  Filled 2021-09-18: qty 2

## 2021-09-18 MED ORDER — PHENYLEPHRINE HCL (PRESSORS) 10 MG/ML IV SOLN
INTRAVENOUS | Status: DC | PRN
  Administered 2021-09-18 (×2): 80 ug via INTRAVENOUS
  Administered 2021-09-18: 160 ug via INTRAVENOUS
  Administered 2021-09-18: 80 ug via INTRAVENOUS
  Administered 2021-09-18: 160 ug via INTRAVENOUS

## 2021-09-18 SURGICAL SUPPLY — 68 items
ANCHOR TIS RET SYS 235ML (MISCELLANEOUS) ×2 IMPLANT
BAG INFUSER PRESSURE 100CC (MISCELLANEOUS) IMPLANT
BLADE SURG SZ11 CARB STEEL (BLADE) ×2 IMPLANT
BULB RESERV EVAC DRAIN JP 100C (MISCELLANEOUS) IMPLANT
CANNULA REDUC XI 12-8 STAPL (CANNULA) ×1
CANNULA REDUCER 12-8 DVNC XI (CANNULA) ×1 IMPLANT
CHLORAPREP W/TINT 26 (MISCELLANEOUS) ×2 IMPLANT
COVER TIP SHEARS 8 DVNC (MISCELLANEOUS) ×1 IMPLANT
COVER TIP SHEARS 8MM DA VINCI (MISCELLANEOUS) ×1
DEFOGGER SCOPE WARMER CLEARIFY (MISCELLANEOUS) ×2 IMPLANT
DERMABOND ADVANCED (GAUZE/BANDAGES/DRESSINGS) ×1
DERMABOND ADVANCED .7 DNX12 (GAUZE/BANDAGES/DRESSINGS) ×1 IMPLANT
DRAIN CHANNEL JP 15F RND 16 (MISCELLANEOUS) IMPLANT
DRAPE ARM DVNC X/XI (DISPOSABLE) ×3 IMPLANT
DRAPE COLUMN DVNC XI (DISPOSABLE) ×1 IMPLANT
DRAPE DA VINCI XI ARM (DISPOSABLE) ×3
DRAPE DA VINCI XI COLUMN (DISPOSABLE) ×1
ELECT CAUTERY BLADE 6.4 (BLADE) IMPLANT
ELECT REM PT RETURN 9FT ADLT (ELECTROSURGICAL) ×2
ELECTRODE REM PT RTRN 9FT ADLT (ELECTROSURGICAL) ×1 IMPLANT
GAUZE 4X4 16PLY ~~LOC~~+RFID DBL (SPONGE) ×2 IMPLANT
GLOVE SURG SYN 6.5 ES PF (GLOVE) ×4 IMPLANT
GLOVE SURG UNDER POLY LF SZ7 (GLOVE) ×4 IMPLANT
GOWN STRL REUS W/ TWL LRG LVL3 (GOWN DISPOSABLE) ×3 IMPLANT
GOWN STRL REUS W/TWL LRG LVL3 (GOWN DISPOSABLE) ×3
GRASPER SUT TROCAR 14GX15 (MISCELLANEOUS) ×2 IMPLANT
HEMOSTAT SURGICEL 2X3 (HEMOSTASIS) ×4 IMPLANT
IRRIGATOR SUCT 8 DISP DVNC XI (IRRIGATION / IRRIGATOR) IMPLANT
IRRIGATOR SUCTION 8MM XI DISP (IRRIGATION / IRRIGATOR)
IV NS 1000ML (IV SOLUTION)
IV NS 1000ML BAXH (IV SOLUTION) IMPLANT
JACKSON PRATT 7MM (INSTRUMENTS) IMPLANT
KIT TURNOVER KIT A (KITS) ×2 IMPLANT
L-HOOK LAP DISP 36CM (ELECTROSURGICAL) ×2
LABEL OR SOLS (LABEL) IMPLANT
LHOOK LAP DISP 36CM (ELECTROSURGICAL) ×1 IMPLANT
MANIFOLD NEPTUNE II (INSTRUMENTS) ×2 IMPLANT
NEEDLE HYPO 22GX1.5 SAFETY (NEEDLE) ×2 IMPLANT
NEEDLE INSUFFLATION 14GA 120MM (NEEDLE) ×2 IMPLANT
OBTURATOR OPTICAL STANDARD 8MM (TROCAR) ×1
OBTURATOR OPTICAL STND 8 DVNC (TROCAR) ×1
OBTURATOR OPTICALSTD 8 DVNC (TROCAR) ×1 IMPLANT
PACK LAP CHOLECYSTECTOMY (MISCELLANEOUS) ×2 IMPLANT
PENCIL ELECTRO HAND CTR (MISCELLANEOUS) ×2 IMPLANT
RELOAD STAPLER 2.5X45 WHT DVNC (STAPLE) ×2 IMPLANT
RELOAD STAPLER 3.5X45 BLU DVNC (STAPLE) ×1 IMPLANT
SEAL CANN UNIV 5-8 DVNC XI (MISCELLANEOUS) ×3 IMPLANT
SEAL XI 5MM-8MM UNIVERSAL (MISCELLANEOUS) ×3
SET TUBE SMOKE EVAC HIGH FLOW (TUBING) ×2 IMPLANT
SOLUTION ELECTROLUBE (MISCELLANEOUS) ×2 IMPLANT
STAPLER 45 DA VINCI SURE FORM (STAPLE) ×1
STAPLER 45 SUREFORM DVNC (STAPLE) ×1 IMPLANT
STAPLER CANNULA SEAL DVNC XI (STAPLE) ×1 IMPLANT
STAPLER CANNULA SEAL XI (STAPLE) ×1
STAPLER RELOAD 2.5X45 WHITE (STAPLE) ×2
STAPLER RELOAD 2.5X45 WHT DVNC (STAPLE) ×2
STAPLER RELOAD 3.5X45 BLU DVNC (STAPLE) ×1
STAPLER RELOAD 3.5X45 BLUE (STAPLE) ×1
SUT ETHILON 3-0 FS-10 30 BLK (SUTURE)
SUT MNCRL AB 4-0 PS2 18 (SUTURE) ×2 IMPLANT
SUT VIC AB 3-0 SH 27 (SUTURE)
SUT VIC AB 3-0 SH 27X BRD (SUTURE) IMPLANT
SUT VICRYL 0 AB UR-6 (SUTURE) ×2 IMPLANT
SUTURE EHLN 3-0 FS-10 30 BLK (SUTURE) IMPLANT
SYR 30ML LL (SYRINGE) ×2 IMPLANT
SYSTEM WECK SHIELD CLOSURE (TROCAR) IMPLANT
WAND RF SURG SPNG DETECT SYS (INSTRUMENTS) ×2 IMPLANT
WATER STERILE IRR 500ML POUR (IV SOLUTION) ×2 IMPLANT

## 2021-09-18 NOTE — Anesthesia Procedure Notes (Signed)
Procedure Name: Intubation Date/Time: 09/18/2021 1:41 PM Performed by: Katherine Basset, CRNA Pre-anesthesia Checklist: Patient identified, Emergency Drugs available, Suction available and Patient being monitored Patient Re-evaluated:Patient Re-evaluated prior to induction Oxygen Delivery Method: Circle system utilized Preoxygenation: Pre-oxygenation with 100% oxygen Induction Type: IV induction Ventilation: Mask ventilation without difficulty Laryngoscope Size: Miller and 2 Grade View: Grade I Tube type: Oral Tube size: 7.0 mm Number of attempts: 1 Airway Equipment and Method: Stylet, Oral airway and Bite block Placement Confirmation: ETT inserted through vocal cords under direct vision, positive ETCO2 and breath sounds checked- equal and bilateral Secured at: 21 cm Tube secured with: Tape Dental Injury: Teeth and Oropharynx as per pre-operative assessment

## 2021-09-18 NOTE — Anesthesia Preprocedure Evaluation (Addendum)
Anesthesia Evaluation  Patient identified by MRN, date of birth, ID band Patient awake    Reviewed: Allergy & Precautions, NPO status , Patient's Chart, lab work & pertinent test results  Airway Mallampati: II  TM Distance: >3 FB Neck ROM: full    Dental   Pulmonary neg pulmonary ROS, former smoker,    Pulmonary exam normal        Cardiovascular Exercise Tolerance: Good hypertension, Normal cardiovascular exam     Neuro/Psych  Headaches, PSYCHIATRIC DISORDERS Anxiety Depression  Neuromuscular disease    GI/Hepatic Neg liver ROS, appendicitis   Endo/Other  negative endocrine ROS  Renal/GU      Musculoskeletal  (+) Arthritis , Osteoarthritis,    Abdominal Normal abdominal exam  (+)   Peds  Hematology negative hematology ROS (+)   Anesthesia Other Findings Past Medical History: 04/15/2020: Acute cystitis No date: Arthritis 03/15/2017: Carpal tunnel syndrome on right 01/25/2018: Common migraine with intractable migraine 12/23/2020: COVID-19 long hauler No date: Depression 07/04/2018: E. coli UTI (urinary tract infection) No date: Fibromyalgia No date: History of chicken pox 2008: History of shingles No date: History of UTI 08/27/2018: Mixed incontinence No date: Seronegative rheumatoid arthritis (HCC) 07/04/2018: Symptom involving bladder 08/27/2018: Urethral fistula  Past Surgical History: 06/28/2017: CARPAL TUNNEL RELEASE 06/28/2017: ELBOW ARTHROSCOPY  BMI    Body Mass Index: 21.26 kg/m      Reproductive/Obstetrics negative OB ROS                             Anesthesia Physical Anesthesia Plan  ASA: 2  Anesthesia Plan: General ETT   Post-op Pain Management:    Induction: Intravenous  PONV Risk Score and Plan: 4 or greater and Ondansetron, Dexamethasone, Midazolam and Aprepitant  Airway Management Planned: Oral ETT  Additional Equipment:   Intra-op Plan:    Post-operative Plan: Extubation in OR  Informed Consent:   Plan Discussed with: Anesthesiologist, CRNA and Surgeon  Anesthesia Plan Comments:        Anesthesia Quick Evaluation

## 2021-09-18 NOTE — ED Notes (Signed)
Pt trying to rest at this time. Pt states pain has improved and rates it a 4/10 at this time. S/O at bedside. Warm blanket provided to pt.

## 2021-09-18 NOTE — ED Notes (Signed)
Pt presents to ED with c/o of lower ABD pain that is radiating more toward her RLQ. Pt denies fevers or chills. Pt states she still has her appendix. Pt denies increase urine frequency and dysuria. Pt states this pain woke her up from sleep around 0300, pt reports pain started at 2100 last night but had increased overnight. Pt states nausea from pain. Pt denies V/D.   Pt is A&Ox4. NAD noted at this time. Pt is tender on palpation to RLQ and mid lower ABD.

## 2021-09-18 NOTE — ED Notes (Signed)
Pt at CT

## 2021-09-18 NOTE — Plan of Care (Signed)
Continuing with plan of care. 

## 2021-09-18 NOTE — Anesthesia Postprocedure Evaluation (Signed)
Anesthesia Post Note  Patient: Krystal Grant  Procedure(s) Performed: XI ROBOTIC LAPAROSCOPIC ASSISTED APPENDECTOMY (Abdomen)  Patient location during evaluation: PACU Anesthesia Type: General Level of consciousness: awake and alert Pain management: pain level controlled Vital Signs Assessment: post-procedure vital signs reviewed and stable Respiratory status: spontaneous breathing, nonlabored ventilation and respiratory function stable Cardiovascular status: blood pressure returned to baseline and stable Postop Assessment: no apparent nausea or vomiting Anesthetic complications: no   No notable events documented.   Last Vitals:  Vitals:   09/18/21 1545 09/18/21 1552  BP: 117/71 103/68  Pulse:  70  Resp:  12  Temp:  36.9 C  SpO2:  100%    Last Pain:  Vitals:   09/18/21 1552  TempSrc:   PainSc: 4                  Foye Deer

## 2021-09-18 NOTE — H&P (Signed)
Subjective:   CC: acute appendicitis  HPI:  Krystal Grant is a 50 y.o. female who is consulted by [facility] for evaluation of  above cc.  Symptoms were first noted several hours ago. Pain is sharp, periumbilical and now migrated more RLQ.  Associated with nothing specific, exacerbated by nothing specific     Past Medical History:  has a past medical history of Acute cystitis (04/15/2020), Arthritis, Carpal tunnel syndrome on right (03/15/2017), Common migraine with intractable migraine (01/25/2018), COVID-19 long hauler (12/23/2020), Depression, E. coli UTI (urinary tract infection) (07/04/2018), Fibromyalgia, History of chicken pox, History of shingles (2008), History of UTI, Mixed incontinence (08/27/2018), Seronegative rheumatoid arthritis (HCC), Symptom involving bladder (07/04/2018), and Urethral fistula (08/27/2018).  Past Surgical History:  has a past surgical history that includes Carpal tunnel release (06/28/2017) and Elbow arthroscopy (06/28/2017).  Family History: family history includes Alcohol abuse in her sister; Arthritis in her maternal grandmother; Asthma in her son; Breast cancer in her paternal grandmother; Cancer in her paternal grandmother; Depression in her sister and son; Hearing loss in her maternal grandmother and paternal grandmother; Heart attack in her maternal grandfather, mother, paternal grandfather, and paternal grandmother; Hypertension in her mother; Learning disabilities in her son; Mental illness in her son; Migraines in her mother; Nephrolithiasis in her mother; Uterine cancer in her maternal aunt.  Social History:  reports that she quit smoking about 3 years ago. Her smoking use included cigarettes. She has a 20.00 pack-year smoking history. She has never used smokeless tobacco. She reports that she does not drink alcohol and does not use drugs.  Current Medications:  Prior to Admission medications   Medication Sig Start Date End Date Taking? Authorizing Provider  b  complex vitamins capsule Take 1 capsule by mouth daily.    [provider]  busPIRone (BUSPAR) 10 MG tablet TAKE 1 TABLET (10 MG TOTAL) BY MOUTH 2 (TWO) TIMES DAILY. FOR ANXIETY. 06/27/21   Doreene Nest, NP  CRANBERRY PO Take by mouth.    [provider]  cyclobenzaprine (FLEXERIL) 10 MG tablet Take 1 tablet by mouth 3 (three) times daily as needed. 06/17/18   [provider]  gabapentin (NEURONTIN) 100 MG capsule Take 100 mg by mouth daily. 09/29/20   [provider]  hydrOXYzine (VISTARIL) 25 MG capsule TAKE 1 CAPSULE BY MOUTH 2 TIMES DAILY AS NEEDED FOR ANXIETY. 07/16/21   Doreene Nest, NP  lisinopril (ZESTRIL) 10 MG tablet Take 1 tablet (10 mg total) by mouth daily. For blood pressure. 05/12/21   Doreene Nest, NP  Multiple Vitamins-Minerals (MULTIVITAMIN WITH MINERALS) tablet Take 1 tablet by mouth daily.    [provider]  mupirocin ointment (BACTROBAN) 2 % Place 1 application into the nose 2 (two) times daily. 02/26/20   Doreene Nest, NP  predniSONE (DELTASONE) 20 MG tablet Take 3 tablets by mouth once daily for two days, then 2 tablets once daily for four days, then 1 tablet once daily for four days. 06/24/21   Doreene Nest, NP  traMADol (ULTRAM) 50 MG tablet Take 50 mg by mouth every 6 (six) hours as needed for moderate pain.    [provider]  trimethoprim (TRIMPEX) 100 MG tablet Take 100 mg by mouth daily. 04/27/21   [provider]  vitamin C (ASCORBIC ACID) 500 MG tablet Take 500 mg by mouth 2 (two) times daily.    [provider]    Allergies:  Allergies as of 09/18/2021 - Review Complete 09/18/2021  Allergen Reaction Noted   Cymbalta [duloxetine hcl]  06/20/2018   Iodine Other (See Comments) 03/26/2020    ROS:  General: Denies weight loss, weight gain, fatigue, fevers, chills, and night sweats. Eyes: Denies blurry vision, double vision, eye pain, itchy eyes, and tearing. Ears: Denies  hearing loss, earache, and ringing in ears. Nose: Denies sinus pain, congestion, infections, runny nose, and nosebleeds. Mouth/throat: Denies hoarseness, sore throat, bleeding gums, and difficulty swallowing. Heart: Denies chest pain, palpitations, racing heart, irregular heartbeat, leg pain or swelling, and decreased activity tolerance. Respiratory: Denies breathing difficulty, shortness of breath, wheezing, cough, and sputum. GI: Denies change in appetite, heartburn, nausea, vomiting, constipation, diarrhea, and blood in stool. GU: Denies difficulty urinating, pain with urinating, urgency, frequency, blood in urine. Musculoskeletal: Denies joint stiffness, pain, swelling, muscle weakness. Skin: Denies rash, itching, mass, tumors, sores, and boils Neurologic: Denies headache, fainting, dizziness, seizures, numbness, and tingling. Psychiatric: Denies depression, anxiety, difficulty sleeping, and memory loss. Endocrine: Denies heat or cold intolerance, and increased thirst or urination. Blood/lymph: Denies easy bruising, easy bruising, and swollen glands     Objective:     BP 94/65   Pulse 87   Temp 97.8 F (36.6 C) (Oral)   Resp 18   Ht 5\' 3"  (1.6 m)   Wt 54.4 kg   SpO2 100%   BMI 21.26 kg/m    Constitutional :  alert, cooperative, appears stated age, and no distress  Lymphatics/Throat:  no asymmetry, masses, or scars  Respiratory:  clear to auscultation bilaterally  Cardiovascular:  regular rate and rhythm  Gastrointestinal: Soft, no guarding, focal TTP RLQ .   Musculoskeletal: Steady gait and movement  Skin: Cool and moist, lap surgical scars  Psychiatric: Normal affect, non-agitated, not confused       LABS:  CMP Latest Ref Rng & Units 09/18/2021 05/12/2021 03/11/2020  Glucose 70 - 99 mg/dL 03/13/2020) 71 90  BUN 6 - 20 mg/dL 18 19 13   Creatinine 0.44 - 1.00 mg/dL 213(Y) 8.65(H  Sodium 135 - 145 mmol/L 137 140 137  Potassium 3.5 - 5.1 mmol/L 4.3 4.4 4.9  Chloride 98 -  111 mmol/L 104 104 101  CO2 22 - 32 mmol/L 27 29 30   Calcium 8.9 - 10.3 mg/dL 9.3 9.6 9.5  Total Protein 6.5 - 8.1 g/dL 7.4 6.7 6.5  Total Bilirubin 0.3 - 1.2 mg/dL 0.7 0.3 0.3  Alkaline Phos 38 - 126 U/L 41 49 48  AST 15 - 41 U/L 20 17 18   ALT 0 - 44 U/L 14 14 17    CBC Latest Ref Rng & Units 09/18/2021 09/18/2021 05/12/2021  WBC 4.0 - 10.5 K/uL 13.7(H) 13.3(H) 3.9(L)  Hemoglobin 12.0 - 15.0 g/dL 10.8(L) 10.7(L) 10.4(L)  Hematocrit 36.0 - 46.0 % 32.1(L) 31.8(L) 30.8(L)  Platelets 150 - 400 K/uL 394 362 364.0     RADS: CLINICAL DATA:  A 50 year old female presents for evaluation of acute abdominal pain, lower severe crampy abdominal pain.   EXAM: CT ABDOMEN AND PELVIS WITHOUT CONTRAST   TECHNIQUE: Multidetector CT imaging of the abdomen and pelvis was performed following the standard protocol without IV contrast.   COMPARISON:  November 24, 2020.   FINDINGS: Lower chest: Lung bases are clear. No effusion. No consolidative process.   Hepatobiliary: Sludge layers dependently in the gallbladder. No acute biliary process on noncontrast imaging.   Pancreas: Difficult to evaluate given opacity of fat within the retroperitoneum and upper abdomen. No gross peripancreatic inflammation. No peripancreatic fluid.   Spleen:  Normal in size and contour.   Adrenals/Urinary Tract: Adrenal glands are normal.   Nephrolithiasis on the LEFT with a 2-3 mm interpolar calculus in the LEFT kidney. No hydronephrosis. No visible ureteral calculi. Smooth contour of the urinary bladder.   Stomach/Bowel: No acute small bowel process. No acute gastric findings. Signs of acute appendicitis with periappendiceal stranding, appendicolith and without signs of focal fluid collection or free air.   Vascular/Lymphatic:   Aortic atherosclerosis. No sign of aneurysm. Smooth contour of the IVC. There is no gastrohepatic or hepatoduodenal ligament lymphadenopathy. No retroperitoneal or mesenteric  lymphadenopathy.   No pelvic sidewall lymphadenopathy.   Stranding anterior to the RIGHT iliac vasculature related to acute appendicitis.   Reproductive: Unremarkable by CT. Inflamed appendix is immediately adjacent to the RIGHT ovary.   Other: No ascites.   Musculoskeletal: No acute bone finding or destructive bone process.   IMPRESSION: Acute appendicitis with intense periappendiceal stranding, no free air, focal fluid or ascites.   Nephrolithiasis.   Aortic Atherosclerosis (ICD10-I70.0).     Electronically Signed   By: Donzetta Kohut M.D.   On: 09/18/2021 09:02 Assessment:      Acute appendicitis  Plan:      Discussed the risk of surgery including post-op infxn, seroma, hematoma, abscess formation, chronic pain, poor-delayed wound healing, possible bowel resection, possible ostomy, possible conversion to open procedure, post-op SBO or ileus, and need for additional procedures to address said risks.  The risks of general anesthetic including MI, CVA, sudden death or even reaction to anesthetic medications also discussed. Alternatives include continued observation, or antibiotic treatment.  Benefits include possible symptom relief,   Typical post operative recovery of 3-5 days rest, also discussed.  The patient understands the risks, any and all questions were answered to the patient's satisfaction.  To OR for robo lap appy.  IVF, abx in the meantime. npo

## 2021-09-18 NOTE — ED Provider Notes (Signed)
[facility] Emergency Department Provider Note   ____________________________________________   Event Date/Time   First MD Initiated Contact with Patient 09/18/21 [phone]     (approximate)  I have reviewed the triage vital signs and the nursing notes.   HISTORY  Chief Complaint Abdominal Pain and Urinary Tract Infection    HPI Krystal Grant is a 50 y.o. female who woke up at 3:00 this morning with a crampy burning pain in the middle of her belly.  It is severe.  She has not had it before.  She is nauseated but not having vomiting or diarrhea.  The pain does not radiate.  It seems to have moved lower into the abdomen over time.  She just finished her menstrual cycle.  She does not have any urgency frequency or dysuria.  She is not coughing or short of breath.        Past Medical History:  Diagnosis Date   Acute cystitis 04/15/2020   Arthritis    Carpal tunnel syndrome on right 03/15/2017   Common migraine with intractable migraine 01/25/2018   COVID-19 long hauler 12/23/2020   Depression    E. 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    Patient Active Problem List   Diagnosis Date Noted   Pain in joint, multiple sites 06/24/2021   Chronic back pain 06/24/2021   Recurrent UTI 10/16/2020   Acute non-recurrent sinusitis 06/04/2020   Nasal sore 02/26/2020   Preventative health care 01/15/2019   HTN (hypertension) 01/02/2019   History of migraine headaches 06/20/2018   Vertigo 06/20/2018   GAD (generalized anxiety disorder) 06/20/2018   Arthritis 06/20/2018    Past Surgical History:  Procedure Laterality Date   CARPAL TUNNEL RELEASE  06/28/2017   ELBOW ARTHROSCOPY  06/28/2017    Prior to Admission medications    Medication Sig Start Date End Date Taking? Authorizing Provider  b complex vitamins capsule Take 1 capsule by mouth daily.    [provider]  busPIRone (BUSPAR) 10 MG tablet TAKE 1 TABLET (10 MG TOTAL) BY MOUTH 2 (TWO) TIMES DAILY. FOR ANXIETY. 06/27/21   Doreene Nest, NP  CRANBERRY PO Take by mouth.    [provider]  cyclobenzaprine (FLEXERIL) 10 MG tablet Take 1 tablet by mouth 3 (three) times daily as needed. 06/17/18   [provider]  gabapentin (NEURONTIN) 100 MG capsule Take 100 mg by mouth daily. 09/29/20   [provider]  hydrOXYzine (VISTARIL) 25 MG capsule TAKE 1 CAPSULE BY MOUTH 2 TIMES DAILY AS NEEDED FOR ANXIETY. 07/16/21   Doreene Nest, NP  lisinopril (ZESTRIL) 10 MG tablet Take 1 tablet (10 mg total) by mouth daily. For blood pressure. 05/12/21   Doreene Nest, NP  Multiple Vitamins-Minerals (MULTIVITAMIN WITH MINERALS) tablet Take 1 tablet by mouth daily.    [provider]  mupirocin ointment (BACTROBAN) 2 % Place 1 application into the nose 2 (two) times daily. 02/26/20   Doreene Nest, NP  predniSONE (DELTASONE) 20 MG tablet Take 3 tablets by mouth once daily for two days, then 2 tablets once daily for four days, then 1 tablet once daily for four days. 06/24/21   Doreene Nest, NP  traMADol Janean Sark)  50 MG tablet Take 50 mg by mouth every 6 (six) hours as needed for moderate pain.    [provider]  trimethoprim (TRIMPEX) 100 MG tablet Take 100 mg by mouth daily. 04/27/21   [provider]  vitamin C (ASCORBIC ACID) 500 MG tablet Take 500 mg by mouth 2 (two) times daily.    [provider]    Allergies Cymbalta [duloxetine hcl] and Iodine  Family History  Problem Relation Age of Onset   Migraines Mother    Hypertension Mother    Heart attack Mother    Nephrolithiasis Mother    Alcohol abuse Sister    Depression Sister    Asthma Son    Depression Son    Mental illness Son     Learning disabilities Son    Arthritis Maternal Grandmother    Hearing loss Maternal Grandmother    Heart attack Maternal Grandfather    Cancer Paternal Grandmother        Breast   Hearing loss Paternal Grandmother    Heart attack Paternal Grandmother    Breast cancer Paternal Grandmother    Heart attack Paternal Grandfather    Uterine cancer Maternal Aunt     Social History Social History   Tobacco Use   Smoking status: Former    Packs/day: 1.00    Years: 20.00    Pack years: 20.00    Types: Cigarettes    Quit date: 04/20/2018    Years since quitting: 3.4   Smokeless tobacco: Never  Vaping Use   Vaping Use: Every day  Substance Use Topics   Alcohol use: No   Drug use: No    Review of Systems  Constitutional: No fever/chills Eyes: No visual changes. ENT: No sore throat. Cardiovascular: Denies chest pain. Respiratory: Denies shortness of breath. Gastrointestinal:  abdominal pain. nausea, no vomiting.  No diarrhea.  No constipation. Genitourinary: Negative for dysuria. Musculoskeletal: Negative for back pain. Skin: Negative for rash. Neurological: Negative for headaches, focal weakness  ____________________________________________   PHYSICAL EXAM:  VITAL SIGNS: ED Triage Vitals  Enc Vitals Group     BP 09/18/21 0416 136/78     Pulse Rate 09/18/21 0416 80     Resp 09/18/21 0416 18     Temp 09/18/21 0416 97.8 F (36.6 C)     Temp Source 09/18/21 0416 Oral     SpO2 09/18/21 0416 95 %     Weight 09/18/21 0417 120 lb (54.4 kg)     Height 09/18/21 0417 5\' 3"  (1.6 m)     Head Circumference --      Peak Flow --      Pain Score 09/18/21 0416 10     Pain Loc --      Pain Edu? --      Excl. in GC? --     Constitutional: Alert and oriented.  Looks uncomfortable Eyes: Conjunctivae are normal. PER Head: Atraumatic. Nose: No congestion/rhinnorhea. Mouth/Throat: Mucous membranes are moist.  Oropharynx non-erythematous. Neck: No stridor.  Cardiovascular:  Normal rate, regular rhythm. Grossly normal heart sounds.  Good peripheral circulation. Respiratory: Normal respiratory effort.  No retractions. Lungs CTAB. Gastrointestinal: Soft tender on the right side of the abdomen to palpation tender across the lower abdomen especially in the mid abdomen to percussion.  No distention. No abdominal bruits. No CVA tenderness. Musculoskeletal: No lower extremity tenderness nor edema.   Neurologic:  Normal speech and language. No gross focal neurologic deficits are appreciated.  Skin:  Skin is warm,  dry and intact. No rash noted.   ____________________________________________   LABS (all labs ordered are listed, but only abnormal results are displayed)  Labs Reviewed  COMPREHENSIVE METABOLIC PANEL - Abnormal; Notable for the following components:      Result Value   Glucose, Bld 118 (*)    Creatinine, Ser 1.11 (*)    All other components within normal limits  CBC - Abnormal; Notable for the following components:   WBC 13.3 (*)    RBC 3.83 (*)    Hemoglobin 10.7 (*)    HCT 31.8 (*)    All other components within normal limits  URINALYSIS, ROUTINE W REFLEX MICROSCOPIC - Abnormal; Notable for the following components:   Color, Urine YELLOW (*)    APPearance CLEAR (*)    Hgb urine dipstick SMALL (*)    Bacteria, UA RARE (*)    All other components within normal limits  CBC WITH DIFFERENTIAL/PLATELET - Abnormal; Notable for the following components:   WBC 13.7 (*)    RBC 3.86 (*)    Hemoglobin 10.8 (*)    HCT 32.1 (*)    Neutro Abs 11.0 (*)    All other components within normal limits  RESP PANEL BY RT-PCR (FLU A&B, COVID) ARPGX2  LIPASE, BLOOD  POC URINE PREG, ED   ____________________________________________  EKG   ____________________________________________  RADIOLOGY Jill Poling, personally viewed and evaluated these images (plain radiographs) as part of my medical decision making, as well as reviewing the written report by  the radiologist.  ED MD interpretation: CT read by radiology reviewed by me shows acute appendicitis next to the ovary on the right side.  Official radiology report(s): CT ABDOMEN PELVIS WO CONTRAST  Result Date: 09/18/2021 CLINICAL DATA:  A 50 year old female presents for evaluation of acute abdominal pain, lower severe crampy abdominal pain. EXAM: CT ABDOMEN AND PELVIS WITHOUT CONTRAST TECHNIQUE: Multidetector CT imaging of the abdomen and pelvis was performed following the standard protocol without IV contrast. COMPARISON:  November 24, 2020. FINDINGS: Lower chest: Lung bases are clear. No effusion. No consolidative process. Hepatobiliary: Sludge layers dependently in the gallbladder. No acute biliary process on noncontrast imaging. Pancreas: Difficult to evaluate given opacity of fat within the retroperitoneum and upper abdomen. No gross peripancreatic inflammation. No peripancreatic fluid. Spleen: Normal in size and contour. Adrenals/Urinary Tract: Adrenal glands are normal. Nephrolithiasis on the LEFT with a 2-3 mm interpolar calculus in the LEFT kidney. No hydronephrosis. No visible ureteral calculi. Smooth contour of the urinary bladder. Stomach/Bowel: No acute small bowel process. No acute gastric findings. Signs of acute appendicitis with periappendiceal stranding, appendicolith and without signs of focal fluid collection or free air. Vascular/Lymphatic: Aortic atherosclerosis. No sign of aneurysm. Smooth contour of the IVC. There is no gastrohepatic or hepatoduodenal ligament lymphadenopathy. No retroperitoneal or mesenteric lymphadenopathy. No pelvic sidewall lymphadenopathy. Stranding anterior to the RIGHT iliac vasculature related to acute appendicitis. Reproductive: Unremarkable by CT. Inflamed appendix is immediately adjacent to the RIGHT ovary. Other: No ascites. Musculoskeletal: No acute bone finding or destructive bone process. IMPRESSION: Acute appendicitis with intense periappendiceal  stranding, no free air, focal fluid or ascites. Nephrolithiasis. Aortic Atherosclerosis (ICD10-I70.0). Electronically Signed   By: Donzetta Kohut M.D.   On: 09/18/2021 09:02    ____________________________________________   PROCEDURES  Procedure(s) performed (including Critical Care):  Procedures   ____________________________________________   INITIAL IMPRESSION / ASSESSMENT AND PLAN / ED COURSE Patient has a history of frequent UTIs but no UTI symptoms and her  urine is currently clear.  She does not have any CVA tenderness.  Again no dysuria urgency or frequency although the pain came on suddenly and is crampy like renal colic there is nothing else to suggest that.  Pain is worse in the lower abdomen and middle on the right side could be appendicitis but that does not usually come on suddenly like this.  We will get a CT scan to further evaluate the etiology of the patient's pain and if this does not help possibly get a ultrasound to check for torsion of the ovaries.  ----------------------------------------- 9:07 AM on 09/18/2021 ----------------------------------------- CT shows acute appendicitis.  I am calling the surgeon now.  We will get the patient in the hospital for treatment.  I will leave it up to the surgeon whether he wishes to do antibiotics or surgery.             ____________________________________________   FINAL CLINICAL IMPRESSION(S) / ED DIAGNOSES  Final diagnoses:  Acute appendicitis, unspecified acute appendicitis type     ED Discharge Orders     None        Note:  This document was prepared using Dragon voice recognition software and may include unintentional dictation errors.    Arnaldo Natal, MD 09/18/21 [phone]

## 2021-09-18 NOTE — ED Triage Notes (Signed)
Pt to ED via [facility] from home c/o mid abd pain with nausea without vomiting or diarrhea that started tonight.  Pt denies urinary changes, just finished menstrual cycle and on ABX to prevent UTI which her urologist has prescribed.  Pt A&Ox4, chest rise even and unlabored, in NAD at this time.

## 2021-09-18 NOTE — Transfer of Care (Signed)
Immediate Anesthesia Transfer of Care Note  Patient: Krystal Grant  Procedure(s) Performed: XI ROBOTIC LAPAROSCOPIC ASSISTED APPENDECTOMY (Abdomen)  Patient Location: PACU  Anesthesia Type:General  Level of Consciousness: awake, alert  and oriented  Airway & Oxygen Therapy: Patient Spontanous Breathing and Patient connected to face mask oxygen  Post-op Assessment: Report given to RN, Post -op Vital signs reviewed and stable and Patient moving all extremities  Post vital signs: Reviewed and stable  Last Vitals:  Vitals Value Taken Time  BP 135/78 09/18/21 1507  Temp 36.4 C 09/18/21 1507  Pulse 86 09/18/21 1512  Resp 18 09/18/21 1512  SpO2 100 % 09/18/21 1512  Vitals shown include unvalidated device data.  Last Pain:  Vitals:   09/18/21 1507  TempSrc:   PainSc: 0-No pain         Complications: No notable events documented.

## 2021-09-18 NOTE — Op Note (Signed)
Preoperative diagnosis: acute appendicitis  Postoperative diagnosis: Same  Procedure: Robotic assisted laparoscopic appendectomy.  Anesthesia: GETA  Surgeon: Sung Amabile  Wound Classification: clean contaminated  Specimen: Appendix  Complications: None  Estimated Blood Loss: 3 mL   Indications: Patient is a 50 y.o. female  presented with above.  Please see H&P for further details.    FIndings: 1.  Irritated appendix  2. No peri-appendiceal abscess or phlegmon 3. Normal anatomy 4. Appendiceal artery ligated and divided with stapler 5. Adequate hemostasis.   Description of procedure: The patient was placed on the operating table in the supine position, left arm tucked. General anesthesia was induced. A time-out was completed verifying correct patient, procedure, site, positioning, and implant(s) and/or special equipment prior to beginning this procedure. The abdomen was prepped and draped in the usual sterile fashion.   Palmer's point located and Veress needle was inserted.  After confirming 2 clicks and a positive saline drop test, gas insufflation was initiated until the abdominal pressure was measured at 15 mmHg.  Afterwards, the Veress needle was removed and a 8 mm port was placed through a periumbilical site using Optiview technique after incision with an 11 blade.  After local was infused, 2 additional incision was made 8 cm apart each side along the left side of the abdominal wall from the initial incision.  An 8 mm port was caudaed and 47mm port cephalad from initial incision, both under direct visualization.  No injuries from trocar placements were noted. The table was placed in the Trendelenburg position with the right side elevated.  Xi robotic platform was then brought to the operative field and docked.  An inflamed appendix was identified and elevated.  Infection was present within the abdominal cavity due to appendicitis. Window created at base of appendix in the  mesentery.   A blue load linear cutting stapler was then used to divide and staple the base of the appendix. It was reloaded with a vascular cartridge and the mesoappendix similarly divided.  No bleeding from the staple lines noted.  The appendix was placed in an endoscopic retrieval bag and removed.   The appendiceal stump and mesoappendix staple line examined again and small area of bleeding noted within mesentary.  This area was reinforced with surgicell and hemostasis was confirmed. No other pathology was identified within pelvis. Excess fluid and blood suctioned out. The 12 mm trocar removed and port site closed with PMI using 0 vicryl under direct vision. Remaining trocars were removed under direct vision. No bleeding was noted.The abdomen was allowed to collapse. All skin incisions then closed with subcuticular sutures Monocryl 4-0.  Wounds then dressed with dermabond.  The patient tolerated the procedure well, awakened from anesthesia and was taken to the postanesthesia care unit in satisfactory condition.  Sponge count and instrument count correct at the end of the procedure.

## 2021-09-19 ENCOUNTER — Other Ambulatory Visit: Payer: Medicaid Other

## 2021-09-19 LAB — CBC
HCT: 28.1 % — ABNORMAL LOW (ref 36.0–46.0)
Hemoglobin: 9 g/dL — ABNORMAL LOW (ref 12.0–15.0)
MCH: 26.5 pg (ref 26.0–34.0)
MCHC: 32 g/dL (ref 30.0–36.0)
MCV: 82.6 fL (ref 80.0–100.0)
Platelets: 314 10*3/uL (ref 150–400)
RBC: 3.4 MIL/uL — ABNORMAL LOW (ref 3.87–5.11)
RDW: 15.1 % (ref 11.5–15.5)
WBC: 12.5 10*3/uL — ABNORMAL HIGH (ref 4.0–10.5)
nRBC: 0 % (ref 0.0–0.2)

## 2021-09-19 LAB — BASIC METABOLIC PANEL
Anion gap: 3 — ABNORMAL LOW (ref 5–15)
BUN: 15 mg/dL (ref 6–20)
CO2: 25 mmol/L (ref 22–32)
Calcium: 8.3 mg/dL — ABNORMAL LOW (ref 8.9–10.3)
Chloride: 111 mmol/L (ref 98–111)
Creatinine, Ser: 0.99 mg/dL (ref 0.44–1.00)
GFR, Estimated: >60 mL/min (ref >60)
Glucose, Bld: 160 mg/dL — ABNORMAL HIGH (ref 70–99)
Potassium: 3.7 mmol/L (ref 3.5–5.1)
Sodium: 139 mmol/L (ref 135–145)

## 2021-09-19 LAB — HEMOGLOBIN AND HEMATOCRIT, BLOOD
HCT: 25.8 % — ABNORMAL LOW (ref 36.0–46.0)
Hemoglobin: 8.7 g/dL — ABNORMAL LOW (ref 12.0–15.0)

## 2021-09-19 LAB — MAGNESIUM: Magnesium: 1.9 mg/dL (ref 1.7–2.4)

## 2021-09-19 LAB — HIV ANTIBODY (ROUTINE TESTING W REFLEX): HIV Screen 4th Generation wRfx: NONREACTIVE

## 2021-09-19 LAB — PHOSPHORUS: Phosphorus: 2.5 mg/dL (ref 2.5–4.6)

## 2021-09-19 MED ORDER — HYDROCODONE-ACETAMINOPHEN 5-325 MG PO TABS: 1.0000 | ORAL_TABLET | Freq: Four times a day (QID) | ORAL | 0 refills | Status: DC | PRN

## 2021-09-19 MED ORDER — DOCUSATE SODIUM 100 MG PO CAPS: 100.0000 mg | ORAL_CAPSULE | Freq: Two times a day (BID) | ORAL | 0 refills | Status: AC | PRN

## 2021-09-19 MED ORDER — ACETAMINOPHEN 325 MG PO TABS: 650.0000 mg | ORAL_TABLET | Freq: Three times a day (TID) | ORAL | 0 refills | Status: AC | PRN

## 2021-09-19 MED ORDER — IBUPROFEN 800 MG PO TABS: 800.0000 mg | ORAL_TABLET | Freq: Three times a day (TID) | ORAL | 0 refills | Status: AC | PRN

## 2021-09-19 NOTE — Progress Notes (Signed)
B/P 83/52 (61), otherwise vital signs WNL and patient is asymptomatic.  Dr. Tonna Boehringer notified, no new orders at this time.

## 2021-09-19 NOTE — Plan of Care (Signed)
Continuing with plan of care. 

## 2021-09-19 NOTE — Discharge Instructions (Signed)
Laparoscopic Appendectomy, Care After This sheet gives you information about how to care for yourself after your procedure. Your doctor may also give you more specific instructions. If you have problems or questions, contact your doctor. Follow these instructions at home: Care for cuts from surgery (incisions)  Follow instructions from your doctor about how to take care of your cuts from surgery. Make sure you: Wash your hands with soap and water before you change your bandage (dressing). If you cannot use soap and water, use hand sanitizer. Change your bandage as told by your doctor. Leave stitches (sutures), skin glue, or skin tape (adhesive) strips in place. They may need to stay in place for 2 weeks or longer. If tape strips get loose and curl up, you may trim the loose edges. Do not remove tape strips completely unless your doctor says it is okay. Do not take baths, swim, or use a hot tub until your doctor says it is okay. OK TO SHOWER 24HRS AFTER YOUR SURGERY.  Check your surgical cut area every day for signs of infection. Check for: More redness, swelling, or pain. More fluid or blood. Warmth. Pus or a bad smell. Activity Do not drive or use heavy machinery while taking prescription pain medicine. Do not play contact sports until your doctor says it is okay. Do not drive for 24 hours if you were given a medicine to help you relax (sedative). Rest as needed. Do not return to work or school until your doctor says it is okay. General instructions  tylenol and advil as needed for discomfort.  Please alternate between the two every four hours as needed for pain.    Use narcotics, if prescribed, only when tylenol and motrin is not enough to control pain.  325-650mg every 8hrs to max of 3000mg/24hrs (including the 325mg in every norco dose) for the tylenol.    Advil up to 800mg per dose every 8hrs as needed for pain.   To prevent or treat constipation while you are taking prescription pain  medicine, your doctor may recommend that you: Drink enough fluid to keep your pee (urine) clear or pale yellow. Take over-the-counter or prescription medicines. Eat foods that are high in fiber, such as fresh fruits and vegetables, whole grains, and beans. Limit foods that are high in fat and processed sugars, such as fried and sweet foods. Contact a doctor if: You develop a rash. You have more redness, swelling, or pain around your surgical cuts. You have more fluid or blood coming from your surgical cuts. Your surgical cuts feel warm to the touch. You have pus or a bad smell coming from your surgical cuts. You have a fever. One or more of your surgical cuts breaks open. Get help right away if: You have trouble breathing. You have chest pain. You faint or feel dizzy when you stand. You have leg pain. This information is not intended to replace advice given to you by your health care provider. Make sure you discuss any questions you have with your health care provider. Document Released: 08/23/2008 Document Revised: 06/04/2016 Document Reviewed: 05/02/2016 Elsevier Interactive Patient Education  2019 [employer] Inc.  

## 2021-09-19 NOTE — Plan of Care (Signed)
Discharge teaching completed with patient who is in stable condition. 

## 2021-09-20 ENCOUNTER — Encounter: Payer: Self-pay | Admitting: Surgery

## 2021-09-20 NOTE — Discharge Summary (Signed)
Physician Discharge Summary  Patient ID: Krystal Grant MRN: [id number] DOB/AGE: [id number] y.o.  Admit date: 09/18/2021 Discharge date: 09/19/21  Admission Diagnoses: Acute appendicitis  Discharge Diagnoses:  Same as above  Discharged Condition: good  Hospital Course: admitted for above. Underwent surgery.  Please see op note for details.  Post op, hemoglobin count dropped a little more than expected but repeat  noted stabilization.  Otherwise, recovered as expected.  At time of d/c, tolerating diet and pain controlled  Consults: None  Discharge Exam: Blood pressure 110/65, pulse 98, temperature 99.1 F (37.3 C), temperature source Oral, resp. rate 14, height 5\' 3"  (1.6 m), weight 54.4 kg, SpO2 100 %. General appearance: alert, cooperative, and no distress GI: Soft, tender to palpation around right lower quadrant and incision sites, which were clean dry and intact  Disposition:  Discharge disposition: 01-Home or Self Care        Allergies as of 09/19/2021       Reactions   Cymbalta [duloxetine Hcl]    disorientation   Iodine Other (See Comments)   Had a vaginal procedure using iodine- her vaginal burned and peeled        Medication List     STOP taking these medications    predniSONE 20 MG tablet Commonly known as: DELTASONE   traMADol 50 MG tablet Commonly known as: ULTRAM       TAKE these medications    acetaminophen 325 MG tablet Commonly known as: Tylenol Take 2 tablets (650 mg total) by mouth every 8 (eight) hours as needed for mild pain.   b complex vitamins capsule Take 1 capsule by mouth daily.   busPIRone 10 MG tablet Commonly known as: BUSPAR TAKE 1 TABLET (10 MG TOTAL) BY MOUTH 2 (TWO) TIMES DAILY. FOR ANXIETY.   cyclobenzaprine 10 MG tablet Commonly known as: FLEXERIL Take 1 tablet by mouth 3 (three) times daily as needed.   docusate sodium 100 MG capsule Commonly known as: Colace Take 1 capsule (100 mg total) by mouth 2  (two) times daily as needed for up to 10 days for mild constipation.   gabapentin 100 MG capsule Commonly known as: NEURONTIN Take 100 mg by mouth daily.   HYDROcodone-acetaminophen 5-325 MG tablet Commonly known as: Norco Take 1 tablet by mouth every 6 (six) hours as needed for up to 6 doses for moderate pain.   hydrOXYzine 25 MG capsule Commonly known as: VISTARIL TAKE 1 CAPSULE BY MOUTH 2 TIMES DAILY AS NEEDED FOR ANXIETY.   ibuprofen 800 MG tablet Commonly known as: ADVIL Take 1 tablet (800 mg total) by mouth every 8 (eight) hours as needed for mild pain or moderate pain.   lisinopril 10 MG tablet Commonly known as: ZESTRIL Take 1 tablet (10 mg total) by mouth daily. For blood pressure.   multivitamin with minerals tablet Take 1 tablet by mouth daily.   mupirocin ointment 2 % Commonly known as: Bactroban Place 1 application into the nose 2 (two) times daily.   trimethoprim 100 MG tablet Commonly known as: TRIMPEX Take 100 mg by mouth daily.        Follow-up Information     09/21/2021, Keilly Fatula, DO Follow up in 2 week(s).   Specialty: Surgery Why: post op lap appy Contact information: [address] [place] [phone]                  Total time spent arranging discharge was >56min. Signed: 31m 09/20/2021, 3:13 PM

## 2021-09-21 LAB — SURGICAL PATHOLOGY

## 2021-09-27 ENCOUNTER — Other Ambulatory Visit: Payer: Self-pay | Admitting: Primary Care

## 2021-09-27 DIAGNOSIS — F411 Generalized anxiety disorder: Secondary | ICD-10-CM

## 2021-09-27 DIAGNOSIS — I10 Essential (primary) hypertension: Secondary | ICD-10-CM

## 2021-10-05 ENCOUNTER — Ambulatory Visit
Admission: RE | Admit: 2021-10-05 | Discharge: 2021-10-05 | Disposition: A | Discharge status: Home / Self Care | Payer: Medicaid Other | Source: Ambulatory Visit | Attending: Neurosurgery | Admitting: Neurosurgery

## 2021-10-05 ENCOUNTER — Other Ambulatory Visit: Payer: Self-pay

## 2021-10-05 DIAGNOSIS — M48061 Spinal stenosis, lumbar region without neurogenic claudication: Secondary | ICD-10-CM | POA: Diagnosis not present

## 2021-10-05 DIAGNOSIS — M545 Low back pain, unspecified: Secondary | ICD-10-CM | POA: Diagnosis not present

## 2021-10-05 DIAGNOSIS — M47816 Spondylosis without myelopathy or radiculopathy, lumbar region: Secondary | ICD-10-CM

## 2021-11-10 ENCOUNTER — Encounter: Payer: Self-pay | Admitting: Surgery

## 2022-01-17 DIAGNOSIS — M542 Cervicalgia: Secondary | ICD-10-CM | POA: Diagnosis not present

## 2022-01-17 DIAGNOSIS — I10 Essential (primary) hypertension: Secondary | ICD-10-CM | POA: Diagnosis not present

## 2022-01-18 ENCOUNTER — Other Ambulatory Visit: Payer: Self-pay | Admitting: Neurological Surgery

## 2022-01-18 DIAGNOSIS — M542 Cervicalgia: Secondary | ICD-10-CM

## 2022-01-25 ENCOUNTER — Telehealth: Payer: Medicaid Other | Admitting: Family Medicine

## 2022-01-25 DIAGNOSIS — B9689 Other specified bacterial agents as the cause of diseases classified elsewhere: Secondary | ICD-10-CM | POA: Diagnosis not present

## 2022-01-25 DIAGNOSIS — J028 Acute pharyngitis due to other specified organisms: Secondary | ICD-10-CM

## 2022-01-25 MED ORDER — PENICILLIN V POTASSIUM 500 MG PO TABS: 500.0000 mg | ORAL_TABLET | Freq: Two times a day (BID) | ORAL | 0 refills | Status: AC

## 2022-01-25 NOTE — Patient Instructions (Signed)

## 2022-01-25 NOTE — Progress Notes (Signed)
Virtual Visit Consent   Krystal Grant, you are scheduled for a virtual visit with a [facility] provider today.     Just as with appointments in the office, your consent must be obtained to participate.  Your consent will be active for this visit and any virtual visit you may have with one of our providers in the next 365 days.     If you have a MyChart account, a copy of this consent can be sent to you electronically.  All virtual visits are billed to your insurance company just like a traditional visit in the office.    As this is a virtual visit, video technology does not allow for your provider to perform a traditional examination.  This may limit your provider's ability to fully assess your condition.  If your provider identifies any concerns that need to be evaluated in person or the need to arrange testing (such as labs, EKG, etc.), we will make arrangements to do so.     Although advances in technology are sophisticated, we cannot ensure that it will always work on either your end or our end.  If the connection with a video visit is poor, the visit may have to be switched to a telephone visit.  With either a video or telephone visit, we are not always able to ensure that we have a secure connection.     I need to obtain your verbal consent now.   Are you willing to proceed with your visit today?    Krystal Grant has provided verbal consent on 01/25/2022 for a virtual visit (video or telephone).   Krystal Mayo, NP   Date: 01/25/2022 12:43 PM   Virtual Visit via Video Note   I, Krystal Grant, connected with  Krystal Grant  ([id number], [birth year]) on 01/25/22 at 12:45 PM EST by a video-enabled telemedicine application and verified that I am speaking with the correct person using two identifiers.  Location: Patient: Virtual Visit Location Patient: Home Provider: Virtual Visit Location Provider: Home Office   I discussed the limitations of evaluation and management by  telemedicine and the availability of in person appointments. The patient expressed understanding and agreed to proceed.    History of Present Illness: Krystal Grant is a 51 y.o. who identifies as a female who was assigned female at birth, and is being seen today for swollen glands- both sides.  HPI: Sore Throat  This is a new problem. The current episode started yesterday. The problem has been gradually worsening. Neither side of throat is experiencing more pain than the other. There has been no fever. The pain is at a severity of 1/10. The pain is mild. Associated symptoms include ear pain and swollen glands. Pertinent negatives include no abdominal pain, congestion, coughing, diarrhea, drooling, ear discharge, headaches, hoarse voice, shortness of breath, trouble swallowing or vomiting. She has had exposure to strep. Exposure to: all family members have it. She has tried gargles for the symptoms. The treatment provided mild relief.   Problems:  Patient Active Problem List   Diagnosis Date Noted   Acute appendicitis 09/18/2021   Pain in joint, multiple sites 06/24/2021   Chronic back pain 06/24/2021   Recurrent UTI 10/16/2020   Acute non-recurrent sinusitis 06/04/2020   Nasal sore 02/26/2020   Preventative health care 01/15/2019   HTN (hypertension) 01/02/2019   History of migraine headaches 06/20/2018   Vertigo 06/20/2018   GAD (generalized anxiety disorder) 06/20/2018   Arthritis 06/20/2018    Allergies:  Allergies  Allergen Reactions   Cymbalta [Duloxetine Hcl]     disorientation   Iodine Other (See Comments)    Had a vaginal procedure using iodine- her vaginal burned and peeled   Medications:  Current Outpatient Medications:    b complex vitamins capsule, Take 1 capsule by mouth daily., Disp: , Rfl:    busPIRone (BUSPAR) 10 MG tablet, TAKE 1 TABLET (10 MG TOTAL) BY MOUTH 2 (TWO) TIMES DAILY. FOR ANXIETY., Disp: 180 tablet, Rfl: 3   cyclobenzaprine (FLEXERIL) 10 MG tablet,  Take 1 tablet by mouth 3 (three) times daily as needed., Disp: , Rfl:    gabapentin (NEURONTIN) 100 MG capsule, Take 100 mg by mouth daily., Disp: , Rfl:    HYDROcodone-acetaminophen (NORCO) 5-325 MG tablet, Take 1 tablet by mouth every 6 (six) hours as needed for up to 6 doses for moderate pain., Disp: 6 tablet, Rfl: 0   hydrOXYzine (VISTARIL) 25 MG capsule, TAKE 1 CAPSULE BY MOUTH 2 TIMES DAILY AS NEEDED FOR ANXIETY., Disp: 180 capsule, Rfl: 0   ibuprofen (ADVIL) 800 MG tablet, Take 1 tablet (800 mg total) by mouth every 8 (eight) hours as needed for mild pain or moderate pain., Disp: 30 tablet, Rfl: 0   lisinopril (ZESTRIL) 10 MG tablet, Take 1 tablet (10 mg total) by mouth daily. For blood pressure., Disp: 90 tablet, Rfl: 3   Multiple Vitamins-Minerals (MULTIVITAMIN WITH MINERALS) tablet, Take 1 tablet by mouth daily., Disp: , Rfl:    mupirocin ointment (BACTROBAN) 2 %, Place 1 application into the nose 2 (two) times daily., Disp: 22 g, Rfl: 0   trimethoprim (TRIMPEX) 100 MG tablet, Take 100 mg by mouth daily., Disp: , Rfl:   Observations/Objective: Patient is well-developed, well-nourished in no acute distress.  Resting comfortably  at home.  Head is normocephalic, atraumatic.  No labored breathing.  Speech is clear and coherent with logical content.  Patient is alert and oriented at baseline.    Assessment and Plan: 1. Acute bacterial pharyngitis Known exposure with + kids in home Symptoms started in last 24 hours and worsening Will treat as below OTC for symptom management encouraged   - penicillin v potassium (VEETID) 500 MG tablet; Take 1 tablet (500 mg total) by mouth 2 (two) times daily for 10 days.  Dispense: 20 tablet; Refill: 0   Reviewed side effects, risks and benefits of medication.    Patient acknowledged agreement and understanding of the plan.   I discussed the assessment and treatment plan with the patient. The patient was provided an opportunity to ask questions  and all were answered. The patient agreed with the plan and demonstrated an understanding of the instructions.   The patient was advised to call back or seek an in-person evaluation if the symptoms worsen or if the condition fails to improve as anticipated.   The above assessment and management plan was discussed with the patient. The patient verbalized understanding of and has agreed to the management plan. Patient is aware to call the clinic if symptoms persist or worsen. Patient is aware when to return to the clinic for a follow-up visit. Patient educated on when it is appropriate to go to the emergency department.    Follow Up Instructions: I discussed the assessment and treatment plan with the patient. The patient was provided an opportunity to ask questions and all were answered. The patient agreed with the plan and demonstrated an understanding of the instructions.  A copy of instructions were sent to the  patient via MyChart unless otherwise noted below.     The patient was advised to call back or seek an in-person evaluation if the symptoms worsen or if the condition fails to improve as anticipated.  Time:  I spent 10 minutes with the patient via telehealth technology discussing the above problems/concerns.    Krystal Mayo, NP

## 2022-01-30 ENCOUNTER — Other Ambulatory Visit: Payer: Self-pay

## 2022-01-30 ENCOUNTER — Ambulatory Visit
Admission: RE | Admit: 2022-01-30 | Discharge: 2022-01-30 | Disposition: A | Discharge status: Home / Self Care | Payer: Medicaid Other | Source: Ambulatory Visit | Attending: Neurological Surgery | Admitting: Neurological Surgery

## 2022-01-30 DIAGNOSIS — M542 Cervicalgia: Secondary | ICD-10-CM | POA: Diagnosis not present

## 2022-02-02 DIAGNOSIS — G5622 Lesion of ulnar nerve, left upper limb: Secondary | ICD-10-CM | POA: Diagnosis not present

## 2022-02-02 DIAGNOSIS — M542 Cervicalgia: Secondary | ICD-10-CM | POA: Diagnosis not present

## 2022-02-02 DIAGNOSIS — G5621 Lesion of ulnar nerve, right upper limb: Secondary | ICD-10-CM | POA: Diagnosis not present

## 2022-02-03 ENCOUNTER — Other Ambulatory Visit: Payer: Medicaid Other

## 2022-02-04 DIAGNOSIS — M79602 Pain in left arm: Secondary | ICD-10-CM | POA: Diagnosis not present

## 2022-02-04 DIAGNOSIS — M79601 Pain in right arm: Secondary | ICD-10-CM | POA: Diagnosis not present

## 2022-02-04 DIAGNOSIS — R200 Anesthesia of skin: Secondary | ICD-10-CM | POA: Diagnosis not present

## 2022-02-07 ENCOUNTER — Other Ambulatory Visit: Payer: Self-pay | Admitting: Primary Care

## 2022-02-07 DIAGNOSIS — F411 Generalized anxiety disorder: Secondary | ICD-10-CM

## 2022-02-17 ENCOUNTER — Telehealth: Payer: Medicaid Other | Admitting: Emergency Medicine

## 2022-02-17 DIAGNOSIS — Z20818 Contact with and (suspected) exposure to other bacterial communicable diseases: Secondary | ICD-10-CM | POA: Diagnosis not present

## 2022-02-17 MED ORDER — AMOXICILLIN 500 MG PO CAPS: 500.0000 mg | ORAL_CAPSULE | Freq: Three times a day (TID) | ORAL | 0 refills | Status: AC

## 2022-02-17 NOTE — Progress Notes (Signed)
?Virtual Visit Consent  ? ?[name], you are scheduled for a virtual visit with a [facility] provider today.   ?  ?Just as with appointments in the office, your consent must be obtained to participate.  Your consent will be active for this visit and any virtual visit you may have with one of our providers in the next 365 days.   ?  ?If you have a MyChart account, a copy of this consent can be sent to you electronically.  All virtual visits are billed to your insurance company just like a traditional visit in the office.   ? ?As this is a virtual visit, video technology does not allow for your provider to perform a traditional examination.  This may limit your provider's ability to fully assess your condition.  If your provider identifies any concerns that need to be evaluated in person or the need to arrange testing (such as labs, EKG, etc.), we will make arrangements to do so.   ?  ?Although advances in technology are sophisticated, we cannot ensure that it will always work on either your end or our end.  If the connection with a video visit is poor, the visit may have to be switched to a telephone visit.  With either a video or telephone visit, we are not always able to ensure that we have a secure connection.    ? ?I need to obtain your verbal consent now.   Are you willing to proceed with your visit today?  ?  ?Eiman Maret has provided verbal consent on 02/17/2022 for a virtual visit (video or telephone). ?  ?Roxy Horseman, PA-C  ? ?Date: 02/17/2022 3:12 PM ? ? ?Virtual Visit via Video Note  ? ?Merwyn Katos, connected with  Kanda Deluna  ([id number], 1971-08-16) on 02/17/22 at  3:15 PM EDT by a video-enabled telemedicine application and verified that I am speaking with the correct person using two identifiers. ? ?Location: ?Patient: Virtual Visit Location Patient: Home ?Provider: Virtual Visit Location Provider: Home Office ?  ?I discussed the limitations of evaluation and management by  telemedicine and the availability of in person appointments. The patient expressed understanding and agreed to proceed.   ? ?History of Present Illness: ?Krystal Grant is a 51 y.o. who identifies as a female who was assigned female at birth, and is being seen today for sore throat.  States children just tested positive for strep throat.  She reports sore throat x 1 day. Denies cough.  States that she has some exudates.  Reports low grade fever. ? ?HPI: HPI  ?Problems:  ?Patient Active Problem List  ? Diagnosis Date Noted  ? Acute appendicitis 09/18/2021  ? Pain in joint, multiple sites 06/24/2021  ? Chronic back pain 06/24/2021  ? Recurrent UTI 10/16/2020  ? Acute non-recurrent sinusitis 06/04/2020  ? Nasal sore 02/26/2020  ? Preventative health care 01/15/2019  ? HTN (hypertension) 01/02/2019  ? History of migraine headaches 06/20/2018  ? Vertigo 06/20/2018  ? GAD (generalized anxiety disorder) 06/20/2018  ? Arthritis 06/20/2018  ?  ?Allergies:  ?Allergies  ?Allergen Reactions  ? Cymbalta [Duloxetine Hcl]   ?  disorientation  ? Iodine Other (See Comments)  ?  Had a vaginal procedure using iodine- her vaginal burned and peeled  ? ?Medications:  ?Current Outpatient Medications:  ?  b complex vitamins capsule, Take 1 capsule by mouth daily., Disp: , Rfl:  ?  busPIRone (BUSPAR) 10 MG tablet, TAKE 1 TABLET (10 MG TOTAL) BY MOUTH  2 (TWO) TIMES DAILY. FOR ANXIETY., Disp: 180 tablet, Rfl: 3 ?  cyclobenzaprine (FLEXERIL) 10 MG tablet, Take 1 tablet by mouth 3 (three) times daily as needed., Disp: , Rfl:  ?  gabapentin (NEURONTIN) 100 MG capsule, Take 100 mg by mouth daily., Disp: , Rfl:  ?  HYDROcodone-acetaminophen (NORCO) 5-325 MG tablet, Take 1 tablet by mouth every 6 (six) hours as needed for up to 6 doses for moderate pain., Disp: 6 tablet, Rfl: 0 ?  hydrOXYzine (VISTARIL) 25 MG capsule, TAKE 1 CAPSULE BY MOUTH 2 TIMES DAILY AS NEEDED FOR ANXIETY., Disp: 180 capsule, Rfl: 0 ?  ibuprofen (ADVIL) 800 MG tablet, Take 1  tablet (800 mg total) by mouth every 8 (eight) hours as needed for mild pain or moderate pain., Disp: 30 tablet, Rfl: 0 ?  lisinopril (ZESTRIL) 10 MG tablet, Take 1 tablet (10 mg total) by mouth daily. For blood pressure., Disp: 90 tablet, Rfl: 3 ?  Multiple Vitamins-Minerals (MULTIVITAMIN WITH MINERALS) tablet, Take 1 tablet by mouth daily., Disp: , Rfl:  ?  mupirocin ointment (BACTROBAN) 2 %, Place 1 application into the nose 2 (two) times daily., Disp: 22 g, Rfl: 0 ?  trimethoprim (TRIMPEX) 100 MG tablet, Take 100 mg by mouth daily., Disp: , Rfl:  ? ?Observations/Objective: ?Patient is well-developed, well-nourished in no acute distress.  ?Resting comfortably  at home.  ?Head is normocephalic, atraumatic.  ?No labored breathing.  ?Speech is clear and coherent with logical content.  ?Patient is alert and oriented at baseline.  ? ? ?Assessment and Plan: ?1. Strep throat exposure ? ?- Amox ?- + strep in family ? ?Follow Up Instructions: ?I discussed the assessment and treatment plan with the patient. The patient was provided an opportunity to ask questions and all were answered. The patient agreed with the plan and demonstrated an understanding of the instructions.  A copy of instructions were sent to the patient via MyChart unless otherwise noted below.  ? ? ? ?The patient was advised to call back or seek an in-person evaluation if the symptoms worsen or if the condition fails to improve as anticipated. ? ?Time:  ?I spent 10 minutes with the patient via telehealth technology discussing the above problems/concerns.   ? ?Roxy Horseman, PA-C ? ?

## 2022-02-21 NOTE — Therapy (Signed)
?OUTPATIENT PHYSICAL THERAPY CERVICAL EVALUATION ? ? ?Patient Name: Krystal Grant ?MRN: [id number] ?DOB:[birth year], 51 y.o., female ?Today's Date: 02/22/2022 ? ? PT End of Session - 02/22/22 1024   ? ? Visit Number 1   ? Number of Visits 8   ? Date for PT Re-Evaluation 04/19/22   ? Authorization Type [employer] MCD   ? Authorization - Number of Visits 27   ? PT Start Time 1045   ? PT Stop Time 1130   ? PT Time Calculation (min) 45 min   ? Activity Tolerance Patient tolerated treatment well   ? Behavior During Therapy [facility] for tasks assessed/performed   ? ?  ?  ? ?  ? ? ?Past Medical History:  ?Diagnosis Date  ? Acute cystitis 04/15/2020  ? Arthritis   ? Carpal tunnel syndrome on right 03/15/2017  ? Common migraine with intractable migraine 01/25/2018  ? 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 Depression   ? E. coli UTI (urinary tract infection) 07/04/2018  ? Fibromyalgia   ? History of chicken pox   ? History of shingles 2008  ? History of UTI   ? Mixed incontinence 08/27/2018  ? Seronegative rheumatoid arthritis ([place])   ? Symptom involving bladder 07/04/2018  ? 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 ?Past Surgical History:  ?Procedure Laterality Date  ? CARPAL TUNNEL RELEASE  06/28/2017  ? ELBOW ARTHROSCOPY  06/28/2017  ? XI ROBOTIC LAPAROSCOPIC ASSISTED APPENDECTOMY N/A 09/18/2021  ? Procedure: XI ROBOTIC LAPAROSCOPIC ASSISTED APPENDECTOMY;  Surgeon: Benjamine Sprague, DO;  Location: [facility] ORS;  Service: General;  Laterality: N/A;  ? ?Patient Active Problem List  ? Diagnosis Date Noted  ? Acute appendicitis 09/18/2021  ? Pain in joint, multiple sites 06/24/2021  ? Chronic back pain 06/24/2021  ? Recurrent UTI 10/16/2020  ? Acute non-recurrent sinusitis 06/04/2020  ? Nasal sore 02/26/2020  ? Preventative health care 01/15/2019  ? HTN (hypertension) 01/02/2019  ? History of migraine headaches 06/20/2018  ? Vertigo 06/20/2018  ? GAD (generalized anxiety disorder) 06/20/2018  ? Arthritis 06/20/2018  ? ? ?PCP: Pleas Koch, NP ? ?REFERRING  PROVIDER: Dawley, Theodoro Doing, DO ? ?REFERRING DIAG: Cervicalgia ? ?THERAPY DIAG:  ?Cervicalgia ? ?Muscle weakness (generalized) ? ?ONSET DATE: "ongoing well over a year" ? ?SUBJECTIVE:          ?SUBJECTIVE STATEMENT: ?Patient reports neck pain for well over a year that has progressively been getting worse. She also reports a lot of radiating pain down her neck, arms, and into fingers. States her shoulders are very tight. Patient also has been getting headaches. She reports that she gets clicking and feels like neck gets binded up on right side of neck. ? ?PERTINENT HISTORY:  ?None ? ?PAIN:  ?Are you having pain? Yes ?NPRS scale: 4-5/10 (worst 8/10) ?Pain location: Neck, radiating into bilateral arms and fingers ?Pain description: Tightness, sore, tender, hot, sharpness,  ?Aggravating factors: Bending or rotating neck, leaving over for long durations, worse in mornings and night ?Relieving factors: Heat ? ?PRECAUTIONS: None ? ?WEIGHT BEARING RESTRICTIONS No ? ?FALLS:  ?Has patient fallen in last 6 months? No ? ?LIVING ENVIRONMENT: ?Lives with: lives with their family ? ?OCCUPATION: Artist and art restoration ? ?PLOF: Independent ? ?PATIENT GOALS: Pain relief and work on posture, core strength/diaphragm ? ? ?OBJECTIVE:  ?DIAGNOSTIC FINDINGS:  ?MRI Cervical Spine 01/30/2022: ?IMPRESSION: ?1. No spinal canal stenosis or neural foraminal narrowing. ?2. Fluid in the right facets at C2-C3, which can be seen in the setting of facet  joint inflammation. ? ?PATIENT SURVEYS:  ?NDI 24/50 ? ?COGNITION: ?Overall cognitive status: Within functional limits for tasks assessed ? ?SENSATION: ?WFL ? ?POSTURE:  ?Rounded shoulder and forward head posture ? ?PALPATION: ?Tender to palpation bilateral upper traps, suboccipitals, right > left cervical paraspinals ? ?CERVICAL ROM:  ? ?Active ROM A/PROM (deg) ?02/22/2022  ?Flexion 50  ?Extension 55 - left sided discomfort  ?Right lateral flexion 30 - right sided pain  ?Left lateral flexion 45  ?Right  rotation 65 - clicking and pain on right  ?Left rotation 70  ? ?UE ROM: ?  Grossly WFL, patient does report soreness of shoulders ? ?UE MMT: ? ?MMT Right ?02/22/2022 Left ?02/22/2022  ?Shoulder flexion 5 5  ?Shoulder extension 4 4  ?Shoulder abduction 5 5  ?Shoulder internal rotation 5 5  ?Shoulder external rotation 5 5  ?Middle trapezius 4 4  ?Lower trapezius 4 4  ? ?CERVICAL SPECIAL TESTS:  ?Radicular testing grossly negative ?DNF endurance test: 5 sec ? ? ?TODAY'S TREATMENT:  ?Supine cervical retraction 5 x 5 sec ?Prone I 5 x 5 sec ?Prone T 5 x 5 sec ?Trialed cervical SNAG rotation but d/c'd due to patient reporting sharp popping ? ?PATIENT EDUCATION:  ?Education details: Exam findings, POC, HEP ?Person educated: Patient ?Education method: Explanation, Demonstration, Tactile cues, Verbal cues, and Handouts ?Education comprehension: verbalized understanding, returned demonstration, verbal cues required, tactile cues required, and needs further education ? ?HOME EXERCISE PROGRAM: ?Access Code: [email or web address] ? ? ?ASSESSMENT: ?CLINICAL IMPRESSION: ?Patient is a 51 y.o. female who was seen today for physical therapy evaluation and treatment for neck pain. Pain mainly seems musculoskeletal in nature, however, she does report radiating pain into arms and hands but with specific positive radicular testing. ? ? ?OBJECTIVE IMPAIRMENTS decreased ROM, decreased strength, postural dysfunction, and pain.  ? ?ACTIVITY LIMITATIONS cleaning, meal prep, and occupation.  ? ?PERSONAL FACTORS Past/current experiences and Time since onset of injury/illness/exacerbation are also affecting patient's functional outcome.  ? ? ?REHAB POTENTIAL: Good ? ?CLINICAL DECISION MAKING: Stable/uncomplicated ? ?EVALUATION COMPLEXITY: Low ? ? ?GOALS: ?Goals reviewed with patient? Yes ? ?SHORT TERM GOALS: Target date: 03/22/2022 ? ?Patient will be I with initial HEP in order to progress with therapy. ?Baseline: HEP provided at eval ?Goal status: INITIAL ? ?2.   Patient will report pain no more than 5/10 with activity in order to reduce functional limitations ?Baseline: 8/10 pain at worst ?Goal status: INITIAL ? ?LONG TERM GOALS: Target date: 04/19/2022 ? ?Patient will be I with final HEP to maintain progress from PT. ?Baseline: HEP provided at eval ?Goal status: INITIAL ? ?2.  Patient will report </= 15/50 on NDI in order to indicate improved functional ability ?Baseline: NDI 24/50 ?Goal status: INITIAL ? ?3.  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 ?4.  Patient will demonstrate cervical AROM WFL and equal bilateral to improve ability to performing working work related tasks ?Baseline: patient exhibits deficits with right side bend and rotation with increased pain ?Goal status: INITIAL ? ? ?PLAN: ?PT FREQUENCY: 1x/week ? ?PT DURATION: 8 weeks ? ?PLANNED INTERVENTIONS: Therapeutic exercises, Therapeutic activity, Neuromuscular re-education, Balance training, Gait training, Patient/Family education, Joint manipulation, Joint mobilization, Aquatic Therapy, Dry Needling, Electrical stimulation, Spinal manipulation, Spinal mobilization, Cryotherapy, Moist heat, Taping, and Manual therapy ? ?PLAN FOR NEXT SESSION: Review HEP and progress PRN, manual/dry needling for cervical region, mobs for right upper/mid facets, DNF  endurance and postural strengthening ? ? ?Hilda Blades, PT, DPT, LAT, ATC ?02/22/22  12:40 PM ?Phone: [phone] ?Fax: [phone] ? ? ? ? ? ? ?

## 2022-02-22 ENCOUNTER — Encounter: Payer: Self-pay | Admitting: Physical Therapy

## 2022-02-22 ENCOUNTER — Other Ambulatory Visit: Payer: Self-pay

## 2022-02-22 ENCOUNTER — Ambulatory Visit: Payer: Medicaid Other | Attending: Neurological Surgery | Admitting: Physical Therapy

## 2022-02-22 DIAGNOSIS — M542 Cervicalgia: Secondary | ICD-10-CM | POA: Insufficient documentation

## 2022-02-22 DIAGNOSIS — M6281 Muscle weakness (generalized): Secondary | ICD-10-CM | POA: Diagnosis present

## 2022-02-22 NOTE — Patient Instructions (Signed)
Access Code: [email or web address] ?URL: [email or web address] ?Date: 02/22/2022 ?Prepared by: Hilda Blades ? ?Exercises ?- Supine Passive Cervical Retraction  - 2 x daily - 2 sets - 10 reps - 5 hold ?- Prone Scapular Slide with Shoulder Extension  - 1-2 x daily - 2 sets - 10 reps - 5 hold ?- Prone T  - 2 x daily - 2 sets - 10 reps - 5 hold ?

## 2022-03-01 NOTE — Therapy (Addendum)
?OUTPATIENT PHYSICAL THERAPY TREATMENT NOTE/ DISCHARGE SUMMARY ? ? ?Patient Name: Krystal Grant ?MRN: [id number] ?DOB:Apr 14, 1971, 51 y.o., female ?Today's Date: 03/02/2022 ? ?PCP: Pleas Koch, NP ?REFERRING PROVIDER: Dawley, Theodoro Doing, DO ? ? PT End of Session - 03/02/22 1035   ? ? Visit Number 2   ? Number of Visits 8   ? Date for PT Re-Evaluation 04/19/22   ? Authorization Type [employer] MCD   ? Authorization - Visit Number 2   ? Authorization - Number of Visits 27   ? PT Start Time 1038   ? PT Stop Time 1125   7 minutes TPDN needle insertion  ? PT Time Calculation (min) 47 min   ? Activity Tolerance Patient tolerated treatment well   ? Behavior During Therapy [facility] for tasks assessed/performed   ? ?  ?  ? ?  ? ? ?Past Medical History:  ?Diagnosis Date  ? Acute cystitis 04/15/2020  ? Arthritis   ? Carpal tunnel syndrome on right 03/15/2017  ? Common migraine with intractable migraine 01/25/2018  ? 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 Depression   ? E. coli UTI (urinary tract infection) 07/04/2018  ? Fibromyalgia   ? History of chicken pox   ? History of shingles 2008  ? History of UTI   ? Mixed incontinence 08/27/2018  ? Seronegative rheumatoid arthritis ([place])   ? Symptom involving bladder 07/04/2018  ? 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 ?Past Surgical History:  ?Procedure Laterality Date  ? CARPAL TUNNEL RELEASE  06/28/2017  ? ELBOW ARTHROSCOPY  06/28/2017  ? XI ROBOTIC LAPAROSCOPIC ASSISTED APPENDECTOMY N/A 09/18/2021  ? Procedure: XI ROBOTIC LAPAROSCOPIC ASSISTED APPENDECTOMY;  Surgeon: Benjamine Sprague, DO;  Location: [facility] ORS;  Service: General;  Laterality: N/A;  ? ?Patient Active Problem List  ? Diagnosis Date Noted  ? Acute appendicitis 09/18/2021  ? Pain in joint, multiple sites 06/24/2021  ? Chronic back pain 06/24/2021  ? Recurrent UTI 10/16/2020  ? Acute non-recurrent sinusitis 06/04/2020  ? Nasal sore 02/26/2020  ? Preventative health care 01/15/2019  ? HTN (hypertension) 01/02/2019  ? History of migraine headaches  06/20/2018  ? Vertigo 06/20/2018  ? GAD (generalized anxiety disorder) 06/20/2018  ? Arthritis 06/20/2018  ? ? ?REFERRING DIAG: Cervicalgia ? ?THERAPY DIAG:  ?Cervicalgia ? ?Muscle weakness (generalized) ? ?PERTINENT HISTORY: Pt reports hx of Ehlers-Danlos Syndrome (OMT contraindicated) ? ?SUBJECTIVE: Pt reports continued baseline cervical pain rated 4/10. She adds that she has been doing her HEP. ? ?PAIN:  ?Are you having pain? Yes ?NPRS scale: 4/10 (worst 8/10) ?Pain location: Neck, radiating into bilateral arms and fingers ?Pain description: Tightness, sore, tender, hot, sharpness,  ?Aggravating factors: Bending or rotating neck, leaving over for long durations, worse in mornings and night ?Relieving factors: Heat ? ? ? ? ? ?OBJECTIVE:  ?*Unless otherwise noted, objective information collected previously* ? ?DIAGNOSTIC FINDINGS:  ?MRI Cervical Spine 01/30/2022: ?IMPRESSION: ?1. No spinal canal stenosis or neural foraminal narrowing. ?2. Fluid in the right facets at C2-C3, which can be seen in the setting of facet joint inflammation. ?  ?PATIENT SURVEYS:  ?NDI 24/50 ?  ?COGNITION: ?Overall cognitive status: Within functional limits for tasks assessed ?  ?SENSATION: ?WFL ?  ?POSTURE:  ?Rounded shoulder and forward head posture ?  ?PALPATION: ?Tender to palpation bilateral upper traps, suboccipitals, right > left cervical paraspinals ?  ?CERVICAL ROM:  ?  ?Active ROM A/PROM (deg) ?02/22/2022  ?Flexion 50  ?Extension 55 - left sided discomfort  ?Right lateral flexion 30 -  right sided pain  ?Left lateral flexion 45  ?Right rotation 65 - clicking and pain on right  ?Left rotation 70  ?  ?UE ROM: ?                      Grossly WFL, patient does report soreness of shoulders ?  ?UE MMT: ?  ?MMT Right ?02/22/2022 Left ?02/22/2022  ?Shoulder flexion 5 5  ?Shoulder extension 4 4  ?Shoulder abduction 5 5  ?Shoulder internal rotation 5 5  ?Shoulder external rotation 5 5  ?Middle trapezius 4 4  ?Lower trapezius 4 4  ?  ?CERVICAL  SPECIAL TESTS:  ?Radicular testing grossly negative ?DNF endurance test: 5 sec ?  ?  ?TODAY'S TREATMENT:  ? ?[facility] Adult PT Treatment:                                                DATE: 03/02/2022 ?Therapeutic Exercise: ?Seated low rows with 25# cable 2x10 ?Seated high rows with 25# cable 2x10 ?Seated lat pull-down with 25# cable 2x10 ?Seated shoulder rolls 2x10 forward and backward ?Prone Y with 1# dumbbells 2x10 ?Cat/cows with chin tuck hold 2x10 ?Seated cervical rotation isotonics x10 BIL with 5-sec force production against handhold resistance ?Manual Therapy: ?Skilled palpation to identify trigger points prior to TPDN ?Effleurage to BIL cervical paraspinals and UT following TPDN ?Neuromuscular re-ed: ?N/A ?Therapeutic Activity: ?N/A ?Modalities: ?N/A ?Self Care: ?N/A ? ?DRY NEEDLING: ?Dry needling consent given? yes ?Educational handouts provided? yes ?Muscles treated: BIL cervical multifidi, BIL UT ?Response from dry needling: Multiple twitch responses and improved muscle extensibility following treatment ? ? ?02/22/2022 ?Supine cervical retraction 5 x 5 sec ?Prone I 5 x 5 sec ?Prone T 5 x 5 sec ?Trialed cervical SNAG rotation but d/c'd due to patient reporting sharp popping ?  ?PATIENT EDUCATION:  ?Education details: Exam findings, POC, HEP ?Person educated: Patient ?Education method: Explanation, Demonstration, Tactile cues, Verbal cues, and Handouts ?Education comprehension: verbalized understanding, returned demonstration, verbal cues required, tactile cues required, and needs further education ?  ?HOME EXERCISE PROGRAM: ?Access Code: [id number] ?  ?  ?ASSESSMENT: ?CLINICAL IMPRESSION: ?Pt responded well to all interventions today, demonstrating good form and no increase in pain with performed exercises. The session began with TPDN and manual therapy, the which the pt reports a therapeutic response. Multiple twitch responses and improved muscle extensibility resulted from TPDN and the pt demonstrated no adverse  response to needling. Due to pt's report of Ehlers-Danlos Syndrome, OMT was not performed today. She will continue to benefit from skilled PT to address her primary impairments and return to her prior level of function with less limitation. ?  ?  ?OBJECTIVE IMPAIRMENTS decreased ROM, decreased strength, postural dysfunction, and pain.  ?  ?ACTIVITY LIMITATIONS cleaning, meal prep, and occupation.  ?  ?PERSONAL FACTORS Past/current experiences and Time since onset of injury/illness/exacerbation are also affecting patient's functional outcome.  ?  ?  ?REHAB POTENTIAL: Good ?  ?CLINICAL DECISION MAKING: Stable/uncomplicated ?  ?EVALUATION COMPLEXITY: Low ?  ?  ?GOALS: ?Goals reviewed with patient? Yes ?  ?SHORT TERM GOALS: Target date: 03/22/2022 ?  ?Patient will be I with initial HEP in order to progress with therapy. ?Baseline: HEP provided at eval ?Goal status: INITIAL ?  ?2.  Patient will report pain no more than 5/10 with activity in order to  reduce functional limitations ?Baseline: 8/10 pain at worst ?Goal status: INITIAL ?  ?LONG TERM GOALS: Target date: 04/19/2022 ?  ?Patient will be I with final HEP to maintain progress from PT. ?Baseline: HEP provided at eval ?Goal status: INITIAL ?  ?2.  Patient will report </= 15/50 on NDI in order to indicate improved functional ability ?Baseline: NDI 24/50 ?Goal status: INITIAL ?  ?3.  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  ?4.  Patient will demonstrate cervical AROM WFL and equal bilateral to improve ability to performing working work related tasks ?Baseline: patient exhibits deficits with right side bend and rotation with increased pain ?Goal status: INITIAL ?  ?  ?PLAN: ?PT FREQUENCY: 1x/week ?  ?PT DURATION: 8 weeks ?  ?PLANNED INTERVENTIONS: Therapeutic exercises, Therapeutic activity, Neuromuscular re-education, Balance training, Gait  training, Patient/Family education, Joint manipulation, Joint mobilization, Aquatic Therapy, Dry Needling, Electrical stimulation, Spinal manipulation, Spinal mobilization, Cryotherapy, Moist heat, Taping, and Manual t

## 2022-03-02 ENCOUNTER — Ambulatory Visit: Payer: Medicaid Other | Attending: Neurological Surgery

## 2022-03-02 DIAGNOSIS — M6281 Muscle weakness (generalized): Secondary | ICD-10-CM | POA: Diagnosis present

## 2022-03-02 DIAGNOSIS — M542 Cervicalgia: Secondary | ICD-10-CM | POA: Diagnosis present

## 2022-03-02 NOTE — Patient Instructions (Signed)

## 2022-03-07 ENCOUNTER — Other Ambulatory Visit: Payer: Self-pay | Admitting: Primary Care

## 2022-03-07 DIAGNOSIS — F411 Generalized anxiety disorder: Secondary | ICD-10-CM

## 2022-03-07 DIAGNOSIS — R200 Anesthesia of skin: Secondary | ICD-10-CM | POA: Diagnosis not present

## 2022-03-07 DIAGNOSIS — I10 Essential (primary) hypertension: Secondary | ICD-10-CM

## 2022-03-10 ENCOUNTER — Ambulatory Visit: Payer: Medicaid Other | Admitting: Physical Therapy

## 2022-03-14 NOTE — Therapy (Incomplete)
?OUTPATIENT PHYSICAL THERAPY TREATMENT NOTE ? ? ?Patient Name: Krystal Grant ?MRN: [id number] ?DOB:1971/02/01, 51 y.o., female ?Today's Date: 03/14/2022 ? ?PCP: Doreene Nestlark, Katherine K, NP ?REFERRING PROVIDER: Dawley, Alan Mulderroy C, DO ? ? ? ? ?Past Medical History:  ?Diagnosis Date  ? Acute cystitis 04/15/2020  ? Arthritis   ? Carpal tunnel syndrome on right 03/15/2017  ? Common migraine with intractable migraine 01/25/2018  ? 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 Depression   ? E. coli UTI (urinary tract infection) 07/04/2018  ? Fibromyalgia   ? History of chicken pox   ? History of shingles 2008  ? History of UTI   ? Mixed incontinence 08/27/2018  ? Seronegative rheumatoid arthritis ([facility])   ? Symptom involving bladder 07/04/2018  ? 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 ?Past Surgical History:  ?Procedure Laterality Date  ? CARPAL TUNNEL RELEASE  06/28/2017  ? ELBOW ARTHROSCOPY  06/28/2017  ? XI ROBOTIC LAPAROSCOPIC ASSISTED APPENDECTOMY N/A 09/18/2021  ? Procedure: XI ROBOTIC LAPAROSCOPIC ASSISTED APPENDECTOMY;  Surgeon: Sung AmabileSakai, Isami, DO;  Location: [facility] ORS;  Service: General;  Laterality: N/A;  ? ?Patient Active Problem List  ? Diagnosis Date Noted  ? Acute appendicitis 09/18/2021  ? Pain in joint, multiple sites 06/24/2021  ? Chronic back pain 06/24/2021  ? Recurrent UTI 10/16/2020  ? Acute non-recurrent sinusitis 06/04/2020  ? Nasal sore 02/26/2020  ? Preventative health care 01/15/2019  ? HTN (hypertension) 01/02/2019  ? History of migraine headaches 06/20/2018  ? Vertigo 06/20/2018  ? GAD (generalized anxiety disorder) 06/20/2018  ? Arthritis 06/20/2018  ? ? ?REFERRING DIAG: Cervicalgia ? ?THERAPY DIAG:  ?No diagnosis found. ? ?PERTINENT HISTORY: Pt reports hx of Ehlers-Danlos Syndrome (OMT contraindicated) ? ?SUBJECTIVE: Pt reports continued baseline cervical pain rated 4/10. She adds that she has been doing her HEP. ? ?PAIN:  ?Are you having pain? Yes ?NPRS scale: 4/10 (worst 8/10) ?Pain location: Neck, radiating into bilateral arms  and fingers ?Pain description: Tightness, sore, tender, hot, sharpness,  ?Aggravating factors: Bending or rotating neck, leaving over for long durations, worse in mornings and night ?Relieving factors: Heat ? ?PATIENT GOALS: Pain relief and work on posture, core strength/diaphragm ? ? ?OBJECTIVE:  ?*Unless otherwise noted, objective information collected previously* ? ?PATIENT SURVEYS:  ?NDI 24/50 ?  ?POSTURE:  ?Rounded shoulder and forward head posture ?  ?PALPATION: ?Tender to palpation bilateral upper traps, suboccipitals, right > left cervical paraspinals ?  ?CERVICAL ROM:  ?  ?Active ROM A/PROM (deg) ?02/22/2022  ?Flexion 50  ?Extension 55 - left sided discomfort  ?Right lateral flexion 30 - right sided pain  ?Left lateral flexion 45  ?Right rotation 65 - clicking and pain on right  ?Left rotation 70  ?  ?UE MMT: ?  ?MMT Right ?02/22/2022 Left ?02/22/2022  ?Shoulder flexion 5 5  ?Shoulder extension 4 4  ?Shoulder abduction 5 5  ?Shoulder internal rotation 5 5  ?Shoulder external rotation 5 5  ?Middle trapezius 4 4  ?Lower trapezius 4 4  ?  ?CERVICAL SPECIAL TESTS:  ?Radicular testing grossly negative ?DNF endurance test: 5 sec ?  ?  ?TODAY'S TREATMENT:  ?[facility]                                                DATE: 03/16/2022 ?Therapeutic Exercise: ?*** ?Manual Therapy: ?*** ? ? ? ?[facility]  DATE: 03/02/2022 ?Therapeutic Exercise: ?Seated low rows with 25# cable 2x10 ?Seated high rows with 25# cable 2x10 ?Seated lat pull-down with 25# cable 2x10 ?Seated shoulder rolls 2x10 forward and backward ?Prone Y with 1# dumbbells 2x10 ?Cat/cows with chin tuck hold 2x10 ?Seated cervical rotation isotonics x10 BIL with 5-sec force production against handhold resistance ?Manual Therapy: ?Skilled palpation to identify trigger points prior to TPDN ?Effleurage to BIL cervical paraspinals and UT following TPDN ?DRY NEEDLING: ?Dry needling consent given?  yes ?Educational handouts provided? yes ?Muscles treated: BIL cervical multifidi, BIL UT ?Response from dry needling: Multiple twitch responses and improved muscle extensibility following treatment ? ?[facility] Adult PT Treatment:                                                DATE: 02/22/2022 ?Therapeutic Exercise: ?Supine cervical retraction 5 x 5 sec ?Prone I 5 x 5 sec ?Prone T 5 x 5 sec ?Trialed cervical SNAG rotation but d/c'd due to patient reporting sharp popping ?  ?PATIENT EDUCATION:  ?Education details: HEP ?Person educated: Patient ?Education method: Explanation, Demonstration, Tactile cues, Verbal cues ?Education comprehension: verbalized understanding, returned demonstration, verbal cues required, tactile cues required, and needs further education ?  ?HOME EXERCISE PROGRAM: ?Access Code: [id number] ?  ?  ?ASSESSMENT: ?CLINICAL IMPRESSION: ?Patient tolerated therapy well with no adverse effects. *** Patient would benefit from continued skilled PT to progress mobility and strength in order to reduce pain and maximize functional ability. ? ?Pt responded well to all interventions today, demonstrating good form and no increase in pain with performed exercises. The session began with TPDN and manual therapy, the which the pt reports a therapeutic response. Multiple twitch responses and improved muscle extensibility resulted from TPDN and the pt demonstrated no adverse response to needling. Due to pt's report of Ehlers-Danlos Syndrome, OMT was not performed today. She will continue to benefit from skilled PT to address her primary impairments and return to her prior level of function with less limitation. ?  ?  ?OBJECTIVE IMPAIRMENTS decreased ROM, decreased strength, postural dysfunction, and pain.  ?  ?ACTIVITY LIMITATIONS cleaning, meal prep, and occupation.  ?  ?PERSONAL FACTORS Past/current experiences and Time since onset of injury/illness/exacerbation are also affecting patient's functional outcome.  ?   ? ?GOALS: ?Goals reviewed with patient? Yes ?  ?SHORT TERM GOALS: Target date: 03/22/2022 ?  ?Patient will be I with initial HEP in order to progress with therapy. ?Baseline: HEP provided at eval ?Goal status: INITIAL ?  ?2.  Patient will report pain no more than 5/10 with activity in order to reduce functional limitations ?Baseline: 8/10 pain at worst ?Goal status: INITIAL ?  ?LONG TERM GOALS: Target date: 04/19/2022 ?  ?Patient will be I with final HEP to maintain progress from PT. ?Baseline: HEP provided at eval ?Goal status: INITIAL ?  ?2.  Patient will report </= 15/50 on NDI in order to indicate improved functional ability ?Baseline: NDI 24/50 ?Goal status: INITIAL ?  ?3.  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  ?4.  Patient will demonstrate cervical AROM WFL and equal bilateral to improve ability to performing working work related tasks ?Baseline: patient exhibits deficits with right side bend and rotation with increased pain ?Goal status: INITIAL ?  ?  ?  PLAN: ?PT FREQUENCY: 1x/week ?  ?PT DURATION: 8 weeks ?  ?PLANNED INTERVENTIONS: Therapeutic exercises, Therapeutic activity, Neuromuscular re-education, Balance training, Gait training, Patient/Family education, Joint manipulation, Joint mobilization, Aquatic Therapy, Dry Needling, Electrical stimulation, Spinal manipulation, Spinal mobilization, Cryotherapy, Moist heat, Taping, and Manual therapy ?  ?PLAN FOR NEXT SESSION: Review HEP and progress PRN, manual/dry needling for cervical region, mobs for right upper/mid facets, DNF endurance and postural strengthening ? ? ? ?Hilda Blades, PT, DPT, LAT, ATC ?03/14/22  3:47 PM ?Phone: [phone] ?Fax: [phone] ? ? ?  ? ?

## 2022-03-16 ENCOUNTER — Ambulatory Visit: Payer: Medicaid Other | Admitting: Physical Therapy

## 2022-03-21 DIAGNOSIS — Z20822 Contact with and (suspected) exposure to covid-19: Secondary | ICD-10-CM | POA: Diagnosis not present

## 2022-03-22 NOTE — Therapy (Incomplete)
?OUTPATIENT PHYSICAL THERAPY TREATMENT NOTE ? ? ?Patient Name: Krystal Grant ?MRN: [id number] ?DOB:07-19-1971, 51 y.o., female ?Today's Date: 03/22/2022 ? ?PCP: Doreene Nestlark, Katherine K, NP ?REFERRING PROVIDER: Dawley, Alan Mulderroy C, DO ? ? ? ? ?Past Medical History:  ?Diagnosis Date  ? Acute cystitis 04/15/2020  ? Arthritis   ? Carpal tunnel syndrome on right 03/15/2017  ? Common migraine with intractable migraine 01/25/2018  ? 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 Depression   ? E. coli UTI (urinary tract infection) 07/04/2018  ? Fibromyalgia   ? History of chicken pox   ? History of shingles 2008  ? History of UTI   ? Mixed incontinence 08/27/2018  ? Seronegative rheumatoid arthritis (HCC)   ? Symptom involving bladder 07/04/2018  ? 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 ?Past Surgical History:  ?Procedure Laterality Date  ? CARPAL TUNNEL RELEASE  06/28/2017  ? ELBOW ARTHROSCOPY  06/28/2017  ? XI ROBOTIC LAPAROSCOPIC ASSISTED APPENDECTOMY N/A 09/18/2021  ? Procedure: XI ROBOTIC LAPAROSCOPIC ASSISTED APPENDECTOMY;  Surgeon: Sung AmabileSakai, Isami, DO;  Location: [facility] ORS;  Service: General;  Laterality: N/A;  ? ?Patient Active Problem List  ? Diagnosis Date Noted  ? Acute appendicitis 09/18/2021  ? Pain in joint, multiple sites 06/24/2021  ? Chronic back pain 06/24/2021  ? Recurrent UTI 10/16/2020  ? Acute non-recurrent sinusitis 06/04/2020  ? Nasal sore 02/26/2020  ? Preventative health care 01/15/2019  ? HTN (hypertension) 01/02/2019  ? History of migraine headaches 06/20/2018  ? Vertigo 06/20/2018  ? GAD (generalized anxiety disorder) 06/20/2018  ? Arthritis 06/20/2018  ? ? ?REFERRING DIAG: Cervicalgia ? ?THERAPY DIAG:  ?No diagnosis found. ? ?PERTINENT HISTORY: Pt reports hx of Ehlers-Danlos Syndrome (OMT contraindicated) ? ?SUBJECTIVE: Pt reports continued baseline cervical pain rated 4/10. She adds that she has been doing her HEP. ? ?PAIN:  ?Are you having pain? Yes ?NPRS scale: 4/10 (worst 8/10) ?Pain location: Neck, radiating into bilateral arms  and fingers ?Pain description: Tightness, sore, tender, hot, sharpness,  ?Aggravating factors: Bending or rotating neck, leaving over for long durations, worse in mornings and night ?Relieving factors: Heat ? ?PATIENT GOALS: Pain relief and work on posture, core strength/diaphragm ? ? ?OBJECTIVE:  ?*Unless otherwise noted, objective information collected previously* ? ?PATIENT SURVEYS:  ?NDI 24/50 ?  ?POSTURE:  ?Rounded shoulder and forward head posture ?  ?PALPATION: ?Tender to palpation bilateral upper traps, suboccipitals, right > left cervical paraspinals ?  ?CERVICAL ROM:  ?  ?Active ROM A/PROM (deg) ?02/22/2022  ?Flexion 50  ?Extension 55 - left sided discomfort  ?Right lateral flexion 30 - right sided pain  ?Left lateral flexion 45  ?Right rotation 65 - clicking and pain on right  ?Left rotation 70  ?  ?UE MMT: ?  ?MMT Right ?02/22/2022 Left ?02/22/2022  ?Shoulder flexion 5 5  ?Shoulder extension 4 4  ?Shoulder abduction 5 5  ?Shoulder internal rotation 5 5  ?Shoulder external rotation 5 5  ?Middle trapezius 4 4  ?Lower trapezius 4 4  ?  ?CERVICAL SPECIAL TESTS:  ?Radicular testing grossly negative ?DNF endurance test: 5 sec ?  ?  ?TODAY'S TREATMENT:  ?[facility]:                                                DATE: 03/23/2022 ?Therapeutic Exercise: ?*** ?Manual Therapy: ?Skilled palpation and monitoring of muscle tension while performing  TPDN treatment ?*** ?Trigger Point Dry Needling Treatment: ?Pre-treatment instruction: Patient instructed on dry needling rationale, procedures, and possible side effects including pain during treatment (achy,cramping feeling), bruising, drop of blood, lightheadedness, nausea, sweating. ?Patient Consent Given: {TPDNConsent:26509} ?Education handout provided: {TPDNEducationHandount:26511} ?Muscles treated: ***  ?Needle size and number: {TPDNNeedleSizeNumber:26514} ?Electrical stimulation performed: {TPDNElectricalStim:26510} ?Parameters:  {TPDNParameters:26512} ?Treatment response/outcome: {TPDNTreatmentResponse:26513} ?Post-treatment instructions: Patient instructed to expect possible mild to moderate muscle soreness later today and/or tomorrow. Patient instructed in methods to reduce muscle soreness and to continue prescribed HEP. If patient was dry needled over the lung field, patient was instructed on signs and symptoms of pneumothorax and, however unlikely, to see immediate medical attention should they occur. Patient was also educated on signs and symptoms of infection and to seek medical attention should they occur. Patient verbalized understanding of these instructions and education. ? ? ?[facility] Adult PT Treatment:                                                DATE: 03/02/2022 ?Therapeutic Exercise: ?Seated low rows with 25# cable 2x10 ?Seated high rows with 25# cable 2x10 ?Seated lat pull-down with 25# cable 2x10 ?Seated shoulder rolls 2x10 forward and backward ?Prone Y with 1# dumbbells 2x10 ?Cat/cows with chin tuck hold 2x10 ?Seated cervical rotation isotonics x10 BIL with 5-sec force production against handhold resistance ?Manual Therapy: ?Skilled palpation to identify trigger points prior to TPDN ?Effleurage to BIL cervical paraspinals and UT following TPDN ?DRY NEEDLING: ?Dry needling consent given? yes ?Educational handouts provided? yes ?Muscles treated: BIL cervical multifidi, BIL UT ?Response from dry needling: Multiple twitch responses and improved muscle extensibility following treatment ? ?[facility] Adult PT Treatment:                                                DATE: 02/22/2022 ?Therapeutic Exercise: ?Supine cervical retraction 5 x 5 sec ?Prone I 5 x 5 sec ?Prone T 5 x 5 sec ?Trialed cervical SNAG rotation but d/c'd due to patient reporting sharp popping ?  ?PATIENT EDUCATION:  ?Education details: HEP ?Person educated: Patient ?Education method: Explanation, Demonstration, Tactile cues, Verbal cues ?Education comprehension: verbalized  understanding, returned demonstration, verbal cues required, tactile cues required, and needs further education ?  ?HOME EXERCISE PROGRAM: ?Access Code: [email or web address] ?  ?  ?ASSESSMENT: ?CLINICAL IMPRESSION: ?Patient tolerated therapy well with no adverse effects. *** Patient would benefit from continued skilled PT to progress mobility and strength in order to reduce pain and maximize functional ability. ? ?Pt responded well to all interventions today, demonstrating good form and no increase in pain with performed exercises. The session began with TPDN and manual therapy, the which the pt reports a therapeutic response. Multiple twitch responses and improved muscle extensibility resulted from TPDN and the pt demonstrated no adverse response to needling. Due to pt's report of Ehlers-Danlos Syndrome, OMT was not performed today. She will continue to benefit from skilled PT to address her primary impairments and return to her prior level of function with less limitation. ?  ?  ?OBJECTIVE IMPAIRMENTS decreased ROM, decreased strength, postural dysfunction, and pain.  ?  ?ACTIVITY LIMITATIONS cleaning, meal prep, and occupation.  ?  ?PERSONAL FACTORS Past/current experiences and Time since onset of  injury/illness/exacerbation are also affecting patient's functional outcome.  ?  ? ?GOALS: ?Goals reviewed with patient? Yes ?  ?SHORT TERM GOALS: Target date: 03/22/2022 ?  ?Patient will be I with initial HEP in order to progress with therapy. ?Baseline: HEP provided at eval ?Goal status: INITIAL ?  ?2.  Patient will report pain no more than 5/10 with activity in order to reduce functional limitations ?Baseline: 8/10 pain at worst ?Goal status: INITIAL ?  ?LONG TERM GOALS: Target date: 04/19/2022 ?  ?Patient will be I with final HEP to maintain progress from PT. ?Baseline: HEP provided at eval ?Goal status: INITIAL ?  ?2.  Patient will report </= 15/50 on NDI in order to indicate improved functional ability ?Baseline: NDI  24/50 ?Goal status: INITIAL ?  ?3.  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  ?4.  Patient will demonstrate cervica

## 2022-03-23 ENCOUNTER — Ambulatory Visit: Payer: Medicaid Other | Admitting: Physical Therapy

## 2022-03-24 ENCOUNTER — Telehealth (INDEPENDENT_AMBULATORY_CARE_PROVIDER_SITE_OTHER): Payer: Medicaid Other | Admitting: Internal Medicine

## 2022-03-24 ENCOUNTER — Encounter: Payer: Self-pay | Admitting: Internal Medicine

## 2022-03-24 DIAGNOSIS — U071 COVID-19: Secondary | ICD-10-CM

## 2022-03-24 HISTORY — DX: COVID-19: U07.1

## 2022-03-24 MED ORDER — NIRMATRELVIR/RITONAVIR (PAXLOVID)TABLET: 3.0000 | ORAL_TABLET | Freq: Two times a day (BID) | ORAL | 0 refills | Status: AC

## 2022-03-24 NOTE — Progress Notes (Signed)
? ?Subjective:  ? ? Patient ID: Krystal Grant, female    DOB: [birth year], 51 y.o.   MRN: [id number] ? ?HPI ?Video virtual visit due to positive COVID test ?Identification done ?Reviewed limitations and billing and she gave consent ?Participants---patient in her home and I am in my office ? ?Younger son tested positive last week--and husband 3 days ?Had some vague symptoms over 3 days--but then really hit yesterday ?Now with headaches, body aches, fatigue, sore throat and runny nose ?Slight cough ?No sig fever---up to 99.6 ?No SOB ?Tested positive this morning ? ?Current Outpatient Medications on File Prior to Visit  ?Medication Sig Dispense Refill  ? b complex vitamins capsule Take 1 capsule by mouth daily.    ? busPIRone (BUSPAR) 10 MG tablet TAKE 1 TABLET (10 MG TOTAL) BY MOUTH 2 (TWO) TIMES DAILY. FOR ANXIETY. 180 tablet 3  ? gabapentin (NEURONTIN) 100 MG capsule Take 100 mg by mouth daily.    ? hydrOXYzine (VISTARIL) 25 MG capsule TAKE 1 CAPSULE BY MOUTH 2 TIMES DAILY AS NEEDED FOR ANXIETY. 180 capsule 0  ? ibuprofen (ADVIL) 800 MG tablet Take 1 tablet (800 mg total) by mouth every 8 (eight) hours as needed for mild pain or moderate pain. 30 tablet 0  ? lisinopril (ZESTRIL) 10 MG tablet Take 1 tablet (10 mg total) by mouth daily. For blood pressure. 90 tablet 3  ? Multiple Vitamins-Minerals (MULTIVITAMIN WITH MINERALS) tablet Take 1 tablet by mouth daily.    ? mupirocin ointment (BACTROBAN) 2 % Place 1 application into the nose 2 (two) times daily. 22 g 0  ? trimethoprim (TRIMPEX) 100 MG tablet Take 100 mg by mouth daily. As needed    ? ?No current facility-administered medications on file prior to visit.  ? ? ?Allergies  ?Allergen Reactions  ? Cymbalta [Duloxetine Hcl]   ?  disorientation  ? Iodine Other (See Comments)  ?  Had a vaginal procedure using iodine- her vaginal burned and peeled  ? ? ?Past Medical History:  ?Diagnosis Date  ? Acute cystitis 04/15/2020  ? Arthritis   ? Carpal tunnel syndrome on right  03/15/2017  ? Common migraine with intractable migraine 01/25/2018  ? 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 Depression   ? E. coli UTI (urinary tract infection) 07/04/2018  ? Fibromyalgia   ? History of chicken pox   ? History of shingles 2008  ? History of UTI   ? Mixed incontinence 08/27/2018  ? Seronegative rheumatoid arthritis (HCC)   ? Symptom involving bladder 07/04/2018  ? 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 ? ?Past Surgical History:  ?Procedure Laterality Date  ? CARPAL TUNNEL RELEASE  06/28/2017  ? ELBOW ARTHROSCOPY  06/28/2017  ? XI ROBOTIC LAPAROSCOPIC ASSISTED APPENDECTOMY N/A 09/18/2021  ? Procedure: XI ROBOTIC LAPAROSCOPIC ASSISTED APPENDECTOMY;  Surgeon: Sung Amabile, DO;  Location: [facility] ORS;  Service: General;  Laterality: N/A;  ? ? ?Family History  ?Problem Relation Age of Onset  ? Migraines Mother   ? Hypertension Mother   ? Heart attack Mother   ? Nephrolithiasis Mother   ? Alcohol abuse Sister   ? Depression Sister   ? Asthma Son   ? Depression Son   ? Mental illness Son   ? Learning disabilities Son   ? Arthritis Maternal Grandmother   ? Hearing loss Maternal Grandmother   ? Heart attack Maternal Grandfather   ? Cancer Paternal Grandmother   ?     Breast  ? Hearing loss Paternal Grandmother   ?  Heart attack Paternal Grandmother   ? Breast cancer Paternal Grandmother   ? Heart attack Paternal Grandfather   ? Uterine cancer Maternal Aunt   ? ? ?Social History  ? ?Socioeconomic History  ? Marital status: Married  ?  Spouse name: Not on file  ? Number of children: 3  ? Years of education: 74  ? Highest education level: Not on file  ?Occupational History  ? Occupation: [employer]   ?Tobacco Use  ? Smoking status: Former  ?  Packs/day: 1.00  ?  Years: 20.00  ?  Pack years: 20.00  ?  Types: Cigarettes  ?  Quit date: 04/20/2018  ?  Years since quitting: 3.9  ? Smokeless tobacco: Never  ?Vaping Use  ? Vaping Use: Every day  ?Substance and Sexual Activity  ? Alcohol use: No  ? Drug use: No  ? Sexual activity: Not on  file  ?Other Topics Concern  ? Not on file  ?Social History Narrative  ? Lives with husband and kids  ? Caffeine use: 2 per day  ? Right handed   ? ?Social Determinants of Health  ? ?Financial Resource Strain: Not on file  ?Food Insecurity: Not on file  ?Transportation Needs: Not on file  ?Physical Activity: Not on file  ?Stress: Not on file  ?Social Connections: Not on file  ?Intimate Partner Violence: Not on file  ? ?Review of Systems ?Some nausea ?No vomiting  ?Appetite is poor ?No rash ?Only uses buspar ---as needed. Last dose was yesterday ? ?   ?Objective:  ? Physical Exam ?Constitutional:   ?   Appearance: Normal appearance.  ?Pulmonary:  ?   Effort: Pulmonary effort is normal. No respiratory distress.  ?Neurological:  ?   Mental Status: She is alert.  ?Psychiatric:     ?   Mood and Affect: Mood normal.     ?   Behavior: Behavior normal.  ?  ? ? ? ? ?   ?Assessment & Plan:  ? ?

## 2022-03-24 NOTE — Assessment & Plan Note (Signed)
Symptoms are relatively mild---and she has been boosted ?We discussed supportive care with analgesics, etc ?Discussed antivirals---she would like to take and it seems reasonable ?Only interaction is buspar---and she hasn't taken it today. ?Will Rx paxlovid full strength-- 3 tabs bid for 5 days ?Hold the buspar till the day after she is done with paxlovid ?

## 2022-04-30 ENCOUNTER — Other Ambulatory Visit: Payer: Self-pay | Admitting: Primary Care

## 2022-04-30 DIAGNOSIS — I10 Essential (primary) hypertension: Secondary | ICD-10-CM

## 2022-05-09 DIAGNOSIS — M542 Cervicalgia: Secondary | ICD-10-CM | POA: Diagnosis not present

## 2022-05-25 ENCOUNTER — Other Ambulatory Visit: Payer: Self-pay | Admitting: Primary Care

## 2022-05-25 DIAGNOSIS — N63 Unspecified lump in unspecified breast: Secondary | ICD-10-CM

## 2022-05-29 ENCOUNTER — Other Ambulatory Visit: Payer: Self-pay | Admitting: Primary Care

## 2022-05-29 DIAGNOSIS — F411 Generalized anxiety disorder: Secondary | ICD-10-CM

## 2022-06-03 ENCOUNTER — Other Ambulatory Visit (HOSPITAL_COMMUNITY)
Admission: RE | Admit: 2022-06-03 | Discharge: 2022-06-03 | Disposition: A | Discharge status: Home / Self Care | Payer: Medicaid Other | Source: Ambulatory Visit | Attending: Primary Care | Admitting: Primary Care

## 2022-06-03 ENCOUNTER — Encounter: Payer: Self-pay | Admitting: Primary Care

## 2022-06-03 ENCOUNTER — Ambulatory Visit (INDEPENDENT_AMBULATORY_CARE_PROVIDER_SITE_OTHER): Payer: Medicaid Other | Admitting: Primary Care

## 2022-06-03 VITALS — BP 108/76 | HR 74 | Temp 98.6°F | Ht 63.0 in | Wt 125.0 lb

## 2022-06-03 DIAGNOSIS — N390 Urinary tract infection, site not specified: Secondary | ICD-10-CM | POA: Diagnosis not present

## 2022-06-03 DIAGNOSIS — R600 Localized edema: Secondary | ICD-10-CM

## 2022-06-03 DIAGNOSIS — G2581 Restless legs syndrome: Secondary | ICD-10-CM

## 2022-06-03 DIAGNOSIS — Z124 Encounter for screening for malignant neoplasm of cervix: Secondary | ICD-10-CM | POA: Insufficient documentation

## 2022-06-03 DIAGNOSIS — Z1211 Encounter for screening for malignant neoplasm of colon: Secondary | ICD-10-CM | POA: Diagnosis not present

## 2022-06-03 DIAGNOSIS — Z0001 Encounter for general adult medical examination with abnormal findings: Secondary | ICD-10-CM

## 2022-06-03 DIAGNOSIS — M2550 Pain in unspecified joint: Secondary | ICD-10-CM

## 2022-06-03 DIAGNOSIS — I10 Essential (primary) hypertension: Secondary | ICD-10-CM | POA: Diagnosis not present

## 2022-06-03 DIAGNOSIS — R5383 Other fatigue: Secondary | ICD-10-CM

## 2022-06-03 DIAGNOSIS — F411 Generalized anxiety disorder: Secondary | ICD-10-CM | POA: Diagnosis not present

## 2022-06-03 DIAGNOSIS — R5382 Chronic fatigue, unspecified: Secondary | ICD-10-CM | POA: Insufficient documentation

## 2022-06-03 HISTORY — DX: Localized edema: R60.0

## 2022-06-03 LAB — COMPREHENSIVE METABOLIC PANEL
ALT: 13 U/L (ref 0–35)
AST: 20 U/L (ref 0–37)
Albumin: 4.4 g/dL (ref 3.5–5.2)
Alkaline Phosphatase: 48 U/L (ref 39–117)
BUN: 11 mg/dL (ref 6–23)
CO2: 30 mEq/L (ref 19–32)
Calcium: 9.7 mg/dL (ref 8.4–10.5)
Chloride: 104 mEq/L (ref 96–112)
Creatinine, Ser: 1 mg/dL (ref 0.40–1.20)
GFR: 66.22 mL/min (ref >60.00)
Glucose, Bld: 93 mg/dL (ref 70–99)
Potassium: 5 mEq/L (ref 3.5–5.1)
Sodium: 139 mEq/L (ref 135–145)
Total Bilirubin: 0.3 mg/dL (ref 0.2–1.2)
Total Protein: 6.8 g/dL (ref 6.0–8.3)

## 2022-06-03 LAB — POC URINALSYSI DIPSTICK (AUTOMATED)
Bilirubin, UA: NEGATIVE
Glucose, UA: NEGATIVE
Ketones, UA: NEGATIVE
Leukocytes, UA: NEGATIVE
Nitrite, UA: NEGATIVE
Protein, UA: NEGATIVE
Spec Grav, UA: <=1.005 — AB (ref 1.010–1.025)
Urobilinogen, UA: 0.2 E.U./dL
pH, UA: 7 (ref 5.0–8.0)

## 2022-06-03 LAB — CBC
HCT: 33 % — ABNORMAL LOW (ref 36.0–46.0)
Hemoglobin: 11 g/dL — ABNORMAL LOW (ref 12.0–15.0)
MCHC: 33.4 g/dL (ref 30.0–36.0)
MCV: 84.6 fl (ref 78.0–100.0)
Platelets: 350 10*3/uL (ref 150.0–400.0)
RBC: 3.91 Mil/uL (ref 3.87–5.11)
RDW: 15.6 % — ABNORMAL HIGH (ref 11.5–15.5)
WBC: 4.5 10*3/uL (ref 4.0–10.5)

## 2022-06-03 LAB — LIPID PANEL
Cholesterol: 225 mg/dL — ABNORMAL HIGH (ref 0–200)
HDL: 70.1 mg/dL (ref >39.00)
LDL Cholesterol: 140 mg/dL — ABNORMAL HIGH (ref 0–99)
NonHDL: 155.13
Total CHOL/HDL Ratio: 3
Triglycerides: 75 mg/dL (ref 0.0–149.0)
VLDL: 15 mg/dL (ref 0.0–40.0)

## 2022-06-03 LAB — VITAMIN B12: Vitamin B-12: 365 pg/mL (ref 211–911)

## 2022-06-03 LAB — TSH: TSH: 1.2 u[IU]/mL (ref 0.35–5.50)

## 2022-06-03 LAB — VITAMIN D 25 HYDROXY (VIT D DEFICIENCY, FRACTURES): VITD: 38.92 ng/mL (ref 30.00–100.00)

## 2022-06-03 NOTE — Assessment & Plan Note (Addendum)
Chronic, increased since Covid-19 infection.   Could very well be effects from COVID-19 infection, but will rule out other cause.  Checking labs today including CBC, TSH, B12, vitamin D, CMP, urinalysis.

## 2022-06-03 NOTE — Assessment & Plan Note (Signed)
Controlled.   Continue lisinopril 10 mg daily.  CMP pending.  

## 2022-06-03 NOTE — Assessment & Plan Note (Signed)
Following with pain management.  Continue gabapentin 100 mg BID.  Continue Tramadol 50 mg PRN. Continue Tizanidine 4 mg PRN.

## 2022-06-03 NOTE — Assessment & Plan Note (Signed)
Chronic. Uncontrolled.  Recommend she discuss increasing her gabapentin dose to 200 mg at bedtime with pain management doctor.

## 2022-06-03 NOTE — Assessment & Plan Note (Signed)
Very mild edema noted today bilaterally to ankles.  Fortunately edema resolves in the morning when waking. Encouraged to elevate legs when resting.  Discussed use of compression socks if needed.  Checking labs today.

## 2022-06-03 NOTE — Assessment & Plan Note (Addendum)
Following with Urology.  Continue trimethoprim 100 mg daily.   Checking UA today as she believes that she is experiencing UTI symptoms now.

## 2022-06-03 NOTE — Assessment & Plan Note (Addendum)
Improved and controlled.  No longer taking Buspirone 10 mg BID. Will keep on medication list for now.'  Continue hydroxyzine 25 mg PRN.   Continue to monitor.

## 2022-06-03 NOTE — Assessment & Plan Note (Signed)
Immunizations UTD. Pap smear due, completed today. Colon cancer screening due, patient opts for Cologuard which was ordered.  Discussed the importance of a healthy diet and regular exercise in order for weight loss, and to reduce the risk of further co-morbidity.  Exam stable. Labs pending.  Follow up in 1 year for repeat physical.

## 2022-06-03 NOTE — Patient Instructions (Addendum)
Stop by the lab prior to leaving today. I will notify you of your results once received.   I recommend increasing your gabapentin dose to 200 mg at bedtime for restless legs.  You may need to increase further to 300 mg.  Please complete the Cologuard kit once received.  It was a pleasure to see you today!  Preventive Care 10-49 Years Old, Female Preventive care refers to lifestyle choices and visits with your health care provider that can promote health and wellness. Preventive care visits are also called wellness exams. What can I expect for my preventive care visit? Counseling Your health care provider may ask you questions about your: Medical history, including: Past medical problems. Family medical history. Pregnancy history. Current health, including: Menstrual cycle. Method of birth control. Emotional well-being. Home life and relationship well-being. Sexual activity and sexual health. Lifestyle, including: Alcohol, nicotine or tobacco, and drug use. Access to firearms. Diet, exercise, and sleep habits. Work and work Statistician. Sunscreen use. Safety issues such as seatbelt and bike helmet use. Physical exam Your health care provider will check your: Height and weight. These may be used to calculate your BMI (body mass index). BMI is a measurement that tells if you are at a healthy weight. Waist circumference. This measures the distance around your waistline. This measurement also tells if you are at a healthy weight and may help predict your risk of certain diseases, such as type 2 diabetes and high blood pressure. Heart rate and blood pressure. Body temperature. Skin for abnormal spots. What immunizations do I need?  Vaccines are usually given at various ages, according to a schedule. Your health care provider will recommend vaccines for you based on your age, medical history, and lifestyle or other factors, such as travel or where you work. What tests do I  need? Screening Your health care provider may recommend screening tests for certain conditions. This may include: Lipid and cholesterol levels. Diabetes screening. This is done by checking your blood sugar (glucose) after you have not eaten for a while (fasting). Pelvic exam and Pap test. Hepatitis B test. Hepatitis C test. HIV (human immunodeficiency virus) test. STI (sexually transmitted infection) testing, if you are at risk. Lung cancer screening. Colorectal cancer screening. Mammogram. Talk with your health care provider about when you should start having regular mammograms. This may depend on whether you have a family history of breast cancer. BRCA-related cancer screening. This may be done if you have a family history of breast, ovarian, tubal, or peritoneal cancers. Bone density scan. This is done to screen for osteoporosis. Talk with your health care provider about your test results, treatment options, and if necessary, the need for more tests. Follow these instructions at home: Eating and drinking  Eat a diet that includes fresh fruits and vegetables, whole grains, lean protein, and low-fat dairy products. Take vitamin and mineral supplements as recommended by your health care provider. Do not drink alcohol if: Your health care provider tells you not to drink. You are pregnant, may be pregnant, or are planning to become pregnant. If you drink alcohol: Limit how much you have to 0-1 drink a day. Know how much alcohol is in your drink. In the [place] one drink equals one 12 oz bottle of beer (355 mL), one 5 oz glass of wine (148 mL), or one 1 oz glass of hard liquor (44 mL). Lifestyle Brush your teeth every morning and night with fluoride toothpaste. Floss one time each day. Exercise for at least  30 minutes 5 or more days each week. Do not use any products that contain nicotine or tobacco. These products include cigarettes, chewing tobacco, and vaping devices, such as  e-cigarettes. If you need help quitting, ask your health care provider. Do not use drugs. If you are sexually active, practice safe sex. Use a condom or other form of protection to prevent STIs. If you do not wish to become pregnant, use a form of birth control. If you plan to become pregnant, see your health care provider for a prepregnancy visit. Take aspirin only as told by your health care provider. Make sure that you understand how much to take and what form to take. Work with your health care provider to find out whether it is safe and beneficial for you to take aspirin daily. Find healthy ways to manage stress, such as: Meditation, yoga, or listening to music. Journaling. Talking to a trusted person. Spending time with friends and family. Minimize exposure to UV radiation to reduce your risk of skin cancer. Safety Always wear your seat belt while driving or riding in a vehicle. Do not drive: If you have been drinking alcohol. Do not ride with someone who has been drinking. When you are tired or distracted. While texting. If you have been using any mind-altering substances or drugs. Wear a helmet and other protective equipment during sports activities. If you have firearms in your house, make sure you follow all gun safety procedures. Seek help if you have been physically or sexually abused. What's next? Visit your health care provider once a year for an annual wellness visit. Ask your health care provider how often you should have your eyes and teeth checked. Stay up to date on all vaccines. This information is not intended to replace advice given to you by your health care provider. Make sure you discuss any questions you have with your health care provider. Document Revised: 05/12/2021 Document Reviewed: 05/12/2021 Elsevier Patient Education  [place].

## 2022-06-03 NOTE — Progress Notes (Signed)
Subjective:    Patient ID: Krystal Grant, female    DOB: 16-Jan-1971, 51 y.o.   MRN: [id number]  HPI  Krystal Grant is a very pleasant 51 y.o. female with a history of hypertension, acute appendicitis, recurrent UTI, migraines, GAD, chronic back pain who presents today for complete physical, follow up of chronic conditions, and to discuss several concerns.  1) Lower Extremity Swelling: Intermittent, from mid calves down to ankles, bilaterally. This began a few months ago with the warmer weather. More noticeable towards the end of the day. When she wakes in the morning her symptoms are resolved. She has an intermittent cough since Covid-19 infection in April 2023. She denies shortness of breath.   2) Restless Legs: Chronic for years. Symptoms include leg kicking, twitching, and aching at night. She is currently managed on gabapentin 100 mg BID per pain management that was started within the last year for chronic pain.   3) Fatigue: Chronic and intermittent history, occurred mostly with joint flares. Her fatigue has been daily since contracting Covid-19 infection in April 2023. She feels fatigued and brain fog for several hours after waking, feels improved around 2 pm daily. In the afternoon she feels more upbeat, positive, energetic.   Immunizations: -Tetanus: 2019 -Influenza: Did not complete last season -Covid-19: 2 vaccines  Diet: Fair diet.  Exercise: No regular exercise. Active   Eye exam: Completes annually  Dental exam: Completes semi-annually   Pap Smear: Completed in February 2020 Mammogram: Completed in June 2022, scheduled for July 2023  Colonoscopy: Completed years ago, opting for Cologuard   BP Readings from Last 3 Encounters:  06/03/22 108/76  09/19/21 110/65  06/24/21 110/62       Review of Systems  Constitutional:  Negative for unexpected weight change.  HENT:  Negative for rhinorrhea.   Respiratory:  Negative for cough and shortness of breath.    Cardiovascular:  Negative for chest pain.  Gastrointestinal:  Negative for constipation and diarrhea.  Genitourinary:  Positive for frequency. Negative for difficulty urinating.       Pelvic pressure  Musculoskeletal:  Positive for arthralgias and neck pain.  Skin:  Negative for rash.  Allergic/Immunologic: Negative for environmental allergies.  Neurological:  Positive for headaches. Negative for dizziness.  Psychiatric/Behavioral:  The patient is not nervous/anxious.          Past Medical History:  Diagnosis Date   Acute appendicitis 09/18/2021   Acute cystitis 04/15/2020   Acute non-recurrent sinusitis 06/04/2020   Arthritis    Carpal tunnel syndrome on right 03/15/2017   Common migraine with intractable migraine 01/25/2018   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    Social History   Socioeconomic History   Marital status: Married    Spouse name: Not on file   Number of children: 3   Years of education: 12   Highest education level: Not on file  Occupational History   Occupation: [employer]   Tobacco Use   Smoking status: Former    Packs/day: 1.00    Years: 20.00    Total pack years: 20.00  Types: Cigarettes    Quit date: 04/20/2018    Years since quitting: 4.1   Smokeless tobacco: Never  Vaping Use   Vaping Use: Every day  Substance and Sexual Activity   Alcohol use: No   Drug use: No   Sexual activity: Not on file  Other Topics Concern   Not on file  Social History Narrative   Lives with husband and kids   Caffeine use: 2 per day   Right handed    Social Determinants of Health   Financial Resource Strain:  Not on file  Food Insecurity: Not on file  Transportation Needs: Not on file  Physical Activity: Not on file  Stress: Not on file  Social Connections: Not on file  Intimate Partner Violence: Not on file    Past Surgical History:  Procedure Laterality Date   CARPAL TUNNEL RELEASE  06/28/2017   ELBOW ARTHROSCOPY  06/28/2017   XI ROBOTIC LAPAROSCOPIC ASSISTED APPENDECTOMY N/A 09/18/2021   Procedure: XI ROBOTIC LAPAROSCOPIC ASSISTED APPENDECTOMY;  Surgeon: Sung Amabile, DO;  Location: [facility] ORS;  Service: General;  Laterality: N/A;    Family History  Problem Relation Age of Onset   Migraines Mother    Hypertension Mother    Heart attack Mother    Nephrolithiasis Mother    Alcohol abuse Sister    Depression Sister    Asthma Son    Depression Son    Mental illness Son    Learning disabilities Son    Arthritis Maternal Grandmother    Hearing loss Maternal Grandmother    Heart attack Maternal Grandfather    Cancer Paternal Grandmother        Breast   Hearing loss Paternal Grandmother    Heart attack Paternal Grandmother    Breast cancer Paternal Grandmother    Heart attack Paternal Grandfather    Uterine cancer Maternal Aunt     Allergies  Allergen Reactions   Clindamycin Hcl    Cymbalta [Duloxetine Hcl]     disorientation   Iodine Other (See Comments)    Had a vaginal procedure using iodine- her vaginal burned and peeled    Current Outpatient Medications on File Prior to Visit  Medication Sig Dispense Refill   b complex vitamins capsule Take 1 capsule by mouth daily.     busPIRone (BUSPAR) 10 MG tablet TAKE 1 TABLET (10 MG TOTAL) BY MOUTH 2 (TWO) TIMES DAILY. FOR ANXIETY. 180 tablet 3   gabapentin (NEURONTIN) 100 MG capsule Take 100 mg by mouth daily.     hydrOXYzine (VISTARIL) 25 MG capsule TAKE 1 CAPSULE BY MOUTH 2 TIMES DAILY AS NEEDED FOR ANXIETY. 60 capsule 0   ibuprofen (ADVIL) 800 MG tablet Take 1 tablet (800 mg total) by mouth every 8 (eight) hours as needed  for mild pain or moderate pain. 30 tablet 0   lisinopril (ZESTRIL) 10 MG tablet Take 1 tablet (10 mg total) by mouth daily. for blood pressure. Office visit required for further refills. 60 tablet 0   Multiple Vitamins-Minerals (MULTIVITAMIN WITH MINERALS) tablet Take 1 tablet by mouth daily.     mupirocin ointment (BACTROBAN) 2 % Place 1 application into the nose 2 (two) times daily. 22 g 0   tiZANidine (ZANAFLEX) 4 MG tablet Take by mouth.     traMADol (ULTRAM) 50 MG tablet TAKE 1 TABLET BY MOUTH EVERY 6 HOURS AS NEEDED FOR CHRONIC PAIN, 30 day supply     trimethoprim (TRIMPEX) 100 MG tablet Take 100 mg by mouth daily.  As needed     No current facility-administered medications on file prior to visit.    BP 108/76   Pulse 74   Temp 98.6 F (37 C) (Oral)   Ht 5\' 3"  (1.6 m)   Wt 125 lb (56.7 kg)   LMP 03/07/2022   SpO2 98%   BMI 22.14 kg/m  Objective:   Physical Exam HENT:     Right Ear: Tympanic membrane and ear canal normal.     Left Ear: Tympanic membrane and ear canal normal.     Nose: Nose normal.  Eyes:     Conjunctiva/sclera: Conjunctivae normal.     Pupils: Pupils are equal, round, and reactive to light.  Neck:     Thyroid: No thyromegaly.  Cardiovascular:     Rate and Rhythm: Normal rate and regular rhythm.     Heart sounds: No murmur heard.    Comments: Mild edema noted to bilateral ankles Pulmonary:     Effort: Pulmonary effort is normal.     Breath sounds: Normal breath sounds. No rales.  Abdominal:     General: Bowel sounds are normal.     Palpations: Abdomen is soft.     Tenderness: There is no abdominal tenderness.  Musculoskeletal:        General: Normal range of motion.     Cervical back: Neck supple.  Lymphadenopathy:     Cervical: No cervical adenopathy.  Skin:    General: Skin is warm and dry.     Findings: No rash.  Neurological:     Mental Status: She is alert and oriented to person, place, and time.     Cranial Nerves: No cranial nerve  deficit.     Deep Tendon Reflexes: Reflexes are normal and symmetric.  Psychiatric:        Mood and Affect: Mood normal.           Assessment & Plan:   Problem List Items Addressed This Visit       Cardiovascular and Mediastinum   HTN (hypertension)    Controlled.  Continue lisinopril 10 mg daily.  CMP pending.      Relevant Orders   Comprehensive metabolic panel   Lipid panel     Genitourinary   Recurrent UTI    Following with Urology.  Continue trimethoprim 100 mg daily.   Checking UA today as she believes that she is experiencing UTI symptoms now.      Relevant Orders   POCT Urinalysis Dipstick (Automated)   Urine Culture     Other   GAD (generalized anxiety disorder)    Improved and controlled.  No longer taking Buspirone 10 mg BID. Will keep on medication list for now.'  Continue hydroxyzine 25 mg PRN.   Continue to monitor.        Encounter for annual general medical examination with abnormal findings in adult - Primary    Immunizations UTD. Pap smear due, completed today. Colon cancer screening due, patient opts for Cologuard which was ordered.  Discussed the importance of a healthy diet and regular exercise in order for weight loss, and to reduce the risk of further co-morbidity.  Exam stable. Labs pending.  Follow up in 1 year for repeat physical.       Pain in joint, multiple sites    Following with pain management.  Continue gabapentin 100 mg BID.  Continue Tramadol 50 mg PRN. Continue Tizanidine 4 mg PRN.       Fatigue    Chronic,  increased since Covid-19 infection.   Could very well be effects from COVID-19 infection, but will rule out other cause.  Checking labs today including CBC, TSH, B12, vitamin D, CMP, urinalysis.        Relevant Orders   CBC   TSH   Vitamin B12   VITAMIN D 25 Hydroxy (Vit-D Deficiency, Fractures)   Bilateral lower extremity edema    Very mild edema noted today bilaterally to  ankles.  Fortunately edema resolves in the morning when waking. Encouraged to elevate legs when resting.  Discussed use of compression socks if needed.  Checking labs today.      Restless legs    Chronic. Uncontrolled.  Recommend she discuss increasing her gabapentin dose to 200 mg at bedtime with pain management doctor.        Other Visit Diagnoses     Screening for colon cancer       Relevant Orders   Cologuard   Screening for cervical cancer       Relevant Orders   Cytology - PAP          Doreene Nest, NP

## 2022-06-04 LAB — URINE CULTURE
MICRO NUMBER:: 13617960
Result:: NO GROWTH
SPECIMEN QUALITY:: ADEQUATE

## 2022-06-06 DIAGNOSIS — M5416 Radiculopathy, lumbar region: Secondary | ICD-10-CM | POA: Diagnosis not present

## 2022-06-07 LAB — CYTOLOGY - PAP
Comment: NEGATIVE
High risk HPV: NEGATIVE

## 2022-06-17 ENCOUNTER — Ambulatory Visit
Admission: RE | Admit: 2022-06-17 | Discharge: 2022-06-17 | Disposition: A | Discharge status: Home / Self Care | Payer: Medicaid Other | Source: Ambulatory Visit | Attending: Primary Care | Admitting: Primary Care

## 2022-06-17 DIAGNOSIS — N63 Unspecified lump in unspecified breast: Secondary | ICD-10-CM

## 2022-06-17 DIAGNOSIS — N644 Mastodynia: Secondary | ICD-10-CM | POA: Diagnosis not present

## 2022-06-21 ENCOUNTER — Other Ambulatory Visit: Payer: Self-pay | Admitting: Primary Care

## 2022-06-21 DIAGNOSIS — F411 Generalized anxiety disorder: Secondary | ICD-10-CM

## 2022-06-23 ENCOUNTER — Other Ambulatory Visit: Payer: Self-pay | Admitting: Primary Care

## 2022-06-23 DIAGNOSIS — I10 Essential (primary) hypertension: Secondary | ICD-10-CM

## 2022-07-04 DIAGNOSIS — M542 Cervicalgia: Secondary | ICD-10-CM | POA: Diagnosis not present

## 2022-07-04 DIAGNOSIS — M47812 Spondylosis without myelopathy or radiculopathy, cervical region: Secondary | ICD-10-CM | POA: Diagnosis not present

## 2022-07-06 ENCOUNTER — Encounter: Payer: Self-pay | Admitting: Primary Care

## 2022-07-06 ENCOUNTER — Ambulatory Visit (INDEPENDENT_AMBULATORY_CARE_PROVIDER_SITE_OTHER): Payer: Medicaid Other | Admitting: Primary Care

## 2022-07-06 VITALS — BP 120/74 | HR 70 | Temp 97.6°F | Ht 63.0 in | Wt 124.0 lb

## 2022-07-06 DIAGNOSIS — R079 Chest pain, unspecified: Secondary | ICD-10-CM | POA: Insufficient documentation

## 2022-07-06 NOTE — Patient Instructions (Addendum)
You will be contacted regarding your referral to cardiology.  Please let us know if you have not been contacted within two weeks.   It was a pleasure to see you today!  

## 2022-07-06 NOTE — Assessment & Plan Note (Signed)
Could be muscular, but need to rule out cardiac cause, especially given significant family history of early CAD. Labs reviewed from July 2023.  ECG today with NSR, rate of 65. No PAC/PCV, no acute ST changes. No prior ECG to compare.   Referral placed to cardiology. Mammogram and [place] of left breast reviewed from July 2023.  LDL of 140 last month, consider statin therapy. Await cardiology evaluation.

## 2022-07-06 NOTE — Progress Notes (Signed)
Subjective:    Patient ID: Krystal Grant, female    DOB: 30-Nov-1970, 51 y.o.   MRN: [id number]  Arm Pain  Associated symptoms include chest pain.    Krystal Grant is a very pleasant 51 y.o. female with a history of hypertension, osteoarthritis, migraines, chronic back pain, restless legs, fatigue who presents today to discuss chest pain.  Her pain is located to the upper left chest and under the left breast with rib soreness under the left breast. Her pain began about 1 month ago for which is intermittent and occurs sporadically. She describes her pain as a "burning sensation" with an achy feeling in between her shoulder blades. About three weeks ago her left upper chest pain woke her from sleep, felt a small amount of pressure.   She denies injury/trauma, esophageal burning, belching, throat fullness, chest pain with exertion, shortness of breath, nausea, abdominal pain. She cannot provoke her pain. Her pain mostly occurs with activity. She's been active in the garden a lot recently, isn't sure if her symptoms are secondary to this or her heart. She participates in yoga, doesn't experience symptoms during yoga.   She has a significant family history of CAD in her family. Her mother had a heart attack at age 79. About 10 years ago she saw cardiology for a risk assessment, she underwent a stress test which was negative.   Her breasts are have been sore since she's been experiencing menopausal symptoms. She underwent diagnostic mammogram with breast ultrasound to left side in July 2023. No suspicious mammographic etiology for her left breast pain.  Her tramadol, gabapentin, and Ibuprofen doesn't help with symptoms. She is a non smoker and is not on hormonal treatment.    Review of Systems  Respiratory:  Negative for shortness of breath.   Cardiovascular:  Positive for chest pain. Negative for palpitations.  Gastrointestinal:  Negative for abdominal pain and nausea.  Musculoskeletal:   Positive for arthralgias and myalgias.  Neurological:  Negative for dizziness.         Past Medical History:  Diagnosis Date   Acute appendicitis 09/18/2021   Acute cystitis 04/15/2020   Acute non-recurrent sinusitis 06/04/2020   Arthritis    Carpal tunnel syndrome on right 03/15/2017   Common migraine with intractable migraine 01/25/2018   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    Social History   Socioeconomic History   Marital status: Married    Spouse name: Not on file   Number of children: 3   Years of education: 12   Highest education level: Not on file  Occupational History   Occupation: [employer]   Tobacco Use   Smoking status: Former    Packs/day: 1.00    Years: 20.00    Total pack years: 20.00    Types: Cigarettes    Quit date: 04/20/2018    Years since quitting: 4.2   Smokeless tobacco: Never  Vaping Use   Vaping Use: Every day  Substance and Sexual Activity   Alcohol use: No   Drug use: No   Sexual activity: Not on file  Other  Topics Concern   Not on file  Social History Narrative   Lives with husband and kids   Caffeine use: 2 per day   Right handed    Social Determinants of Health   Financial Resource Strain: Not on file  Food Insecurity: Not on file  Transportation Needs: Not on file  Physical Activity: Not on file  Stress: Not on file  Social Connections: Not on file  Intimate Partner Violence: Not on file    Past Surgical History:  Procedure Laterality Date   CARPAL TUNNEL RELEASE  06/28/2017   ELBOW ARTHROSCOPY  06/28/2017   XI ROBOTIC LAPAROSCOPIC ASSISTED APPENDECTOMY N/A 09/18/2021    Procedure: XI ROBOTIC LAPAROSCOPIC ASSISTED APPENDECTOMY;  Surgeon: Sung Amabile, DO;  Location: [facility] ORS;  Service: General;  Laterality: N/A;    Family History  Problem Relation Age of Onset   Migraines Mother    Hypertension Mother    Heart attack Mother    Nephrolithiasis Mother    Alcohol abuse Sister    Depression Sister    Asthma Son    Depression Son    Mental illness Son    Learning disabilities Son    Arthritis Maternal Grandmother    Hearing loss Maternal Grandmother    Heart attack Maternal Grandfather    Cancer Paternal Grandmother        Breast   Hearing loss Paternal Grandmother    Heart attack Paternal Grandmother    Breast cancer Paternal Grandmother    Heart attack Paternal Grandfather    Uterine cancer Maternal Aunt     Allergies  Allergen Reactions   Clindamycin Hcl    Cymbalta [Duloxetine Hcl]     disorientation   Iodine Other (See Comments)    Had a vaginal procedure using iodine- her vaginal burned and peeled    Current Outpatient Medications on File Prior to Visit  Medication Sig Dispense Refill   b complex vitamins capsule Take 1 capsule by mouth daily.     busPIRone (BUSPAR) 10 MG tablet TAKE 1 TABLET (10 MG TOTAL) BY MOUTH 2 (TWO) TIMES DAILY. FOR ANXIETY. 180 tablet 3   gabapentin (NEURONTIN) 300 MG capsule Take 300 mg by mouth 3 (three) times daily.     hydrOXYzine (VISTARIL) 25 MG capsule TAKE 1 CAPSULE BY MOUTH 2 TIMES DAILY AS NEEDED FOR ANXIETY. 180 capsule 0   ibuprofen (ADVIL) 800 MG tablet Take 1 tablet (800 mg total) by mouth every 8 (eight) hours as needed for mild pain or moderate pain. 30 tablet 0   lisinopril (ZESTRIL) 10 MG tablet Take 1 tablet (10 mg total) by mouth daily. for blood pressure. 90 tablet 3   Multiple Vitamins-Minerals (MULTIVITAMIN WITH MINERALS) tablet Take 1 tablet by mouth daily.     mupirocin ointment (BACTROBAN) 2 % Place 1 application into the nose 2 (two) times daily. 22 g 0   tiZANidine (ZANAFLEX) 4 MG  tablet Take by mouth.     traMADol (ULTRAM) 50 MG tablet TAKE 1 TABLET BY MOUTH EVERY 6 HOURS AS NEEDED FOR CHRONIC PAIN, 30 day supply     trimethoprim (TRIMPEX) 100 MG tablet Take 100 mg by mouth daily. As needed     No current facility-administered medications on file prior to visit.    BP 120/74   Pulse 70   Temp 97.6 F (36.4 C) (Oral)   Ht 5\' 3"  (1.6 m)   Wt 124 lb (56.2 kg)   SpO2 99%   BMI 21.97 kg/m  Objective:   Physical Exam Cardiovascular:     Rate and Rhythm: Normal rate and regular rhythm.  Pulmonary:     Effort: Pulmonary effort is normal.     Breath sounds: Normal breath sounds.  Chest:     Chest wall: No tenderness.    Musculoskeletal:     Cervical back: Neck supple.  Skin:    General: Skin is warm and dry.           Assessment & Plan:   Problem List Items Addressed This Visit       Other   Chest pain - Primary    Could be muscular, but need to rule out cardiac cause, especially given significant family history of early CAD. Labs reviewed from July 2023.  ECG today with NSR, rate of 65. No PAC/PCV, no acute ST changes. No prior ECG to compare.   Referral placed to cardiology. Mammogram and [place] of left breast reviewed from July 2023.  LDL of 140 last month, consider statin therapy. Await cardiology evaluation.       Relevant Orders   EKG 12-Lead (Completed)   Ambulatory referral to Cardiology       Doreene Nest, NP

## 2022-07-08 ENCOUNTER — Telehealth: Payer: Self-pay | Admitting: Primary Care

## 2022-07-08 NOTE — Telephone Encounter (Signed)
..   Medicaid Managed Care   Unsuccessful Outreach Note  07/08/2022 Name: Krystal Grant MRN: [id number] DOB: May 13, 1971  Referred by: Doreene Nest, NP Reason for referral : High Risk Managed Medicaid (I called the patient today to get her scheduled with the MM Team. I left my name and number on her VM.)   An unsuccessful telephone outreach was attempted today. The patient was referred to the case management team for assistance with care management and care coordination.   Follow Up Plan: The care management team will reach out to the patient again over the next 14 days.    Weston Settle Care Guide, High Risk Medicaid Managed Care Embedded Care Coordination [facility]  [facility]    SIGNATURE

## 2022-07-27 ENCOUNTER — Encounter: Payer: Self-pay | Admitting: Cardiology

## 2022-07-27 DIAGNOSIS — E785 Hyperlipidemia, unspecified: Secondary | ICD-10-CM | POA: Insufficient documentation

## 2022-07-27 NOTE — Progress Notes (Unsigned)
Cardiology Office Note   Date:  07/27/2022   ID:  Bellami Farrelly, DOB 1971-09-28, MRN [id number]  PCP:  Doreene Nest, NP  Cardiologist:   None Referring:  ***  No chief complaint on file.     History of Present Illness: Krystal Grant is a 51 y.o. female who was referred by *** fore valuation of chest pain.  ***   Past Medical History:  Diagnosis Date   Acute appendicitis 09/18/2021   Acute cystitis 04/15/2020   Acute non-recurrent sinusitis 06/04/2020   Arthritis    Carpal tunnel syndrome on right 03/15/2017   Common migraine with intractable migraine 01/25/2018   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     busPIRone (BUSPAR) 10 MG tablet TAKE 1 TABLET (10 MG TOTAL) BY MOUTH 2 (TWO) TIMES DAILY. FOR ANXIETY. 180 tablet 3   gabapentin (NEURONTIN) 300 MG capsule Take 300 mg by mouth 3 (three) times daily.     hydrOXYzine (VISTARIL) 25 MG capsule TAKE 1 CAPSULE BY MOUTH 2 TIMES DAILY AS NEEDED FOR ANXIETY. 180 capsule 0   ibuprofen (ADVIL) 800 MG tablet Take 1 tablet (800 mg total) by mouth every 8 (eight) hours as needed for mild pain or moderate pain. 30 tablet 0    lisinopril (ZESTRIL) 10 MG tablet Take 1 tablet (10 mg total) by mouth daily. for blood pressure. 90 tablet 3   Multiple Vitamins-Minerals (MULTIVITAMIN WITH MINERALS) tablet Take 1 tablet by mouth daily.     mupirocin ointment (BACTROBAN) 2 % Place 1 application into the nose 2 (two) times daily. 22 g 0   tiZANidine (ZANAFLEX) 4 MG tablet Take by mouth.     traMADol (ULTRAM) 50 MG tablet TAKE 1 TABLET BY MOUTH EVERY 6 HOURS AS NEEDED FOR CHRONIC PAIN, 30 day supply     trimethoprim (TRIMPEX) 100 MG tablet Take 100 mg by mouth daily. As needed     No current facility-administered medications for this visit.    Allergies:   Clindamycin hcl, Cymbalta [duloxetine hcl], and Iodine    Social History:  The patient  reports that she quit smoking about 4 years ago. Her smoking use included cigarettes. She has a 20.00 pack-year smoking history. She has never used smokeless tobacco. She reports that she does not drink alcohol and does not use drugs.   Family History:  The patient's ***family history includes Alcohol abuse in her sister; Arthritis in her maternal grandmother; Asthma in her son; Breast cancer in her paternal grandmother; Cancer in her paternal grandmother; Depression in her sister and son; Hearing loss in her maternal grandmother and paternal grandmother; Heart attack in  her maternal grandfather, mother, paternal grandfather, and paternal grandmother; Hypertension in her mother; Learning disabilities in her son; Mental illness in her son; Migraines in her mother; Nephrolithiasis in her mother; Uterine cancer in her maternal aunt.    ROS:  Please see the history of present illness.   Otherwise, review of systems are positive for {NONE DEFAULTED:18576}.   All other systems are reviewed and negative.    PHYSICAL EXAM: VS:  There were no vitals taken for this visit. , BMI There is no height or weight on file to calculate BMI. GENERAL:  Well appearing HEENT:  Pupils equal round and reactive,  fundi not visualized, oral mucosa unremarkable NECK:  No jugular venous distention, waveform within normal limits, carotid upstroke brisk and symmetric, no bruits, no thyromegaly LYMPHATICS:  No cervical, inguinal adenopathy LUNGS:  Clear to auscultation bilaterally BACK:  No CVA tenderness CHEST:  Unremarkable HEART:  PMI not displaced or sustained,S1 and S2 within normal limits, no S3, no S4, no clicks, no rubs, *** murmurs ABD:  Flat, positive bowel sounds normal in frequency in pitch, no bruits, no rebound, no guarding, no midline pulsatile mass, no hepatomegaly, no splenomegaly EXT:  2 plus pulses throughout, no edema, no cyanosis no clubbing SKIN:  No rashes no nodules NEURO:  Cranial nerves II through XII grossly intact, motor grossly intact throughout PSYCH:  Cognitively intact, oriented to person place and time    EKG:  EKG {ACTION; IS/IS DJS:[id number]} ordered today. The ekg ordered today demonstrates ***   Recent Labs: 09/19/2021: Magnesium 1.9 06/03/2022: ALT 13; BUN 11; Creatinine, Ser 1.00; Hemoglobin 11.0; Platelets 350.0; Potassium 5.0; Sodium 139; TSH 1.20    Lipid Panel    Component Value Date/Time   CHOL 225 (H) 06/03/2022 1056   TRIG 75.0 06/03/2022 1056   HDL 70.10 06/03/2022 1056   CHOLHDL 3 06/03/2022 1056   VLDL 15.0 06/03/2022 1056   LDLCALC 140 (H) 06/03/2022 1056      Wt Readings from Last 3 Encounters:  07/06/22 124 lb (56.2 kg)  06/03/22 125 lb (56.7 kg)  03/24/22 125 lb (56.7 kg)      Other studies Reviewed: Additional studies/ records that were reviewed today include: ***. Review of the above records demonstrates:  Please see elsewhere in the note.  ***   ASSESSMENT AND PLAN:  Chest pain:  ***  Dyslipidemia:  ***    Current medicines are reviewed at length with the patient today.  The patient {ACTIONS; HAS/DOES NOT HAVE:19233} concerns regarding medicines.  The following changes have been made:  {PLAN; NO CHANGE:13088:s}  Labs/  tests ordered today include: *** No orders of the defined types were placed in this encounter.    Disposition:   FU with ***    Signed, Rollene Rotunda, MD  07/27/2022 8:40 PM    [facility]

## 2022-07-28 ENCOUNTER — Encounter: Payer: Self-pay | Admitting: Cardiology

## 2022-07-28 ENCOUNTER — Ambulatory Visit: Payer: Medicaid Other | Attending: Cardiology | Admitting: Cardiology

## 2022-07-28 VITALS — BP 114/70 | Ht 63.0 in | Wt 121.2 lb

## 2022-07-28 DIAGNOSIS — R072 Precordial pain: Secondary | ICD-10-CM

## 2022-07-28 DIAGNOSIS — E785 Hyperlipidemia, unspecified: Secondary | ICD-10-CM | POA: Diagnosis not present

## 2022-07-28 MED ORDER — METOPROLOL TARTRATE 50 MG PO TABS: ORAL_TABLET | ORAL | 0 refills | Status: DC

## 2022-07-28 NOTE — Patient Instructions (Signed)
  Testing/Procedures:  Your cardiac CT will be scheduled at   [facility] [address] [place], [place] [address] [phone]  If scheduled at [facility], please arrive at the [facility] and Children's Entrance (Entrance C2) of [facility] 30 minutes prior to test start time. You can use the FREE valet parking offered at entrance C (encouraged to control the heart rate for the test)  Proceed to the [facility] Radiology Department (first floor) to check-in and test prep.  All radiology patients and guests should use entrance C2 at [facility], accessed from [facility], even though the hospital's physical address listed is [address]     Please follow these instructions carefully (unless otherwise directed):    On the Night Before the Test: Be sure to Drink plenty of water. Do not consume any caffeinated/decaffeinated beverages or chocolate 12 hours prior to your test. Do not take any antihistamines 12 hours prior to your test.   On the Day of the Test: Drink plenty of water until 1 hour prior to the test. Do not eat any food 4 hours prior to the test. You may take your regular medications prior to the test.  Take metoprolol (Lopressor) 50 mg  two hours prior to test. FEMALES- please wear underwire-free bra if available, avoid dresses & tight clothing       After the Test: Drink plenty of water. After receiving IV contrast, you may experience a mild flushed feeling. This is normal. On occasion, you may experience a mild rash up to 24 hours after the test. This is not dangerous. If this occurs, you can take Benadryl 25 mg and increase your fluid intake. If you experience trouble breathing, this can be serious. If it is severe call 911 IMMEDIATELY. If it is mild, please call our office.  We will call to schedule your test 2-4 weeks out understanding that some insurance companies will need an authorization prior to  the service being performed.   For non-scheduling related questions, please contact the cardiac imaging nurse navigator should you have any questions/concerns: Krystal Grant, Cardiac Imaging Nurse Navigator Krystal Grant, Cardiac Imaging Nurse Navigator [facility] Direct Office Dial: [phone]   For scheduling needs, including cancellations and rescheduling, please call [place], [phone].    Follow-Up: At [facility], you and your health needs are our priority.  As part of our continuing mission to provide you with exceptional heart care, we have created designated Provider Care Teams.  These Care Teams include your primary Cardiologist (physician) and Advanced Practice Providers (APPs -  Physician Assistants and Nurse Practitioners) who all work together to provide you with the care you need, when you need it.  We recommend signing up for the patient portal called "MyChart".  Sign up information is provided on this After Visit Summary.  MyChart is used to connect with patients for Virtual Visits (Telemedicine).  Patients are able to view lab/test results, encounter notes, upcoming appointments, etc.  Non-urgent messages can be sent to your provider as well.   To learn more about what you can do with MyChart, go to [email or web address].    Your next appointment:    As needed

## 2022-08-12 DIAGNOSIS — Z01818 Encounter for other preprocedural examination: Secondary | ICD-10-CM | POA: Diagnosis not present

## 2022-08-12 DIAGNOSIS — R072 Precordial pain: Secondary | ICD-10-CM | POA: Diagnosis not present

## 2022-08-13 LAB — BASIC METABOLIC PANEL
BUN/Creatinine Ratio: 6 — ABNORMAL LOW (ref 9–23)
BUN: 6 mg/dL (ref 6–24)
CO2: 21 mmol/L (ref 20–29)
Calcium: 9.5 mg/dL (ref 8.7–10.2)
Chloride: 102 mmol/L (ref 96–106)
Creatinine, Ser: 0.93 mg/dL (ref 0.57–1.00)
Glucose: 89 mg/dL (ref 70–99)
Potassium: 3.7 mmol/L (ref 3.5–5.2)
Sodium: 140 mmol/L (ref 134–144)
eGFR: 75 mL/min/{1.73_m2} (ref >59)

## 2022-08-13 LAB — BETA HCG QUANT (REF LAB): hCG Quant: <1 m[IU]/mL

## 2022-08-16 ENCOUNTER — Telehealth (HOSPITAL_COMMUNITY): Payer: Self-pay | Admitting: Emergency Medicine

## 2022-08-16 NOTE — Telephone Encounter (Signed)
Reaching out to patient to offer assistance regarding upcoming cardiac imaging study; pt verbalizes understanding of appt date/time, parking situation and where to check in, pre-test NPO status and medications ordered, and verified current allergies; name and call back number provided for further questions should they arise Marchia Bond RN Navigator Cardiac Imaging [facility] [phone] office [phone] cell   Arrival 1100 w/c entrance Denies iv issues 50mg  metoprolol tartrate  Reports reaction to povidone iodine, CT with contrast in past - no problems, Aware nitro

## 2022-08-17 ENCOUNTER — Ambulatory Visit (HOSPITAL_COMMUNITY)
Admission: RE | Admit: 2022-08-17 | Discharge: 2022-08-17 | Disposition: A | Discharge status: Home / Self Care | Payer: Medicaid Other | Source: Ambulatory Visit | Attending: Cardiology | Admitting: Cardiology

## 2022-08-17 DIAGNOSIS — R072 Precordial pain: Secondary | ICD-10-CM | POA: Insufficient documentation

## 2022-08-17 MED ORDER — IOHEXOL 350 MG/ML SOLN
95.0000 mL | Freq: Once | INTRAVENOUS | Status: AC | PRN
  Administered 2022-08-17: 95 mL via INTRAVENOUS

## 2022-08-17 MED ORDER — NITROGLYCERIN 0.4 MG SL SUBL
0.8000 mg | SUBLINGUAL_TABLET | Freq: Once | SUBLINGUAL | Status: AC
  Administered 2022-08-17: 0.8 mg via SUBLINGUAL

## 2022-08-17 MED ORDER — NITROGLYCERIN 0.4 MG SL SUBL
SUBLINGUAL_TABLET | SUBLINGUAL | Status: AC
  Filled 2022-08-17: qty 2

## 2022-08-17 MED ORDER — NITROGLYCERIN 0.4 MG SL SUBL: 0.8000 mg | SUBLINGUAL_TABLET | Freq: Once | SUBLINGUAL | Status: DC

## 2022-08-22 ENCOUNTER — Encounter: Payer: Self-pay | Admitting: *Deleted

## 2022-08-25 DIAGNOSIS — M542 Cervicalgia: Secondary | ICD-10-CM | POA: Diagnosis not present

## 2022-08-25 DIAGNOSIS — M47812 Spondylosis without myelopathy or radiculopathy, cervical region: Secondary | ICD-10-CM | POA: Diagnosis not present

## 2022-09-02 ENCOUNTER — Other Ambulatory Visit: Payer: Self-pay | Admitting: Primary Care

## 2022-09-02 NOTE — Telephone Encounter (Signed)
Send refill request to pain management doctor, thanks!

## 2022-09-12 ENCOUNTER — Telehealth: Payer: Self-pay | Admitting: Primary Care

## 2022-09-12 ENCOUNTER — Other Ambulatory Visit: Payer: Self-pay | Admitting: Primary Care

## 2022-09-12 DIAGNOSIS — L821 Other seborrheic keratosis: Secondary | ICD-10-CM | POA: Diagnosis not present

## 2022-09-12 DIAGNOSIS — D2261 Melanocytic nevi of right upper limb, including shoulder: Secondary | ICD-10-CM | POA: Diagnosis not present

## 2022-09-12 DIAGNOSIS — D2271 Melanocytic nevi of right lower limb, including hip: Secondary | ICD-10-CM | POA: Diagnosis not present

## 2022-09-12 DIAGNOSIS — L814 Other melanin hyperpigmentation: Secondary | ICD-10-CM | POA: Diagnosis not present

## 2022-09-12 DIAGNOSIS — D2272 Melanocytic nevi of left lower limb, including hip: Secondary | ICD-10-CM | POA: Diagnosis not present

## 2022-09-12 DIAGNOSIS — D2262 Melanocytic nevi of left upper limb, including shoulder: Secondary | ICD-10-CM | POA: Diagnosis not present

## 2022-09-12 DIAGNOSIS — D225 Melanocytic nevi of trunk: Secondary | ICD-10-CM | POA: Diagnosis not present

## 2022-09-12 DIAGNOSIS — L239 Allergic contact dermatitis, unspecified cause: Secondary | ICD-10-CM | POA: Diagnosis not present

## 2022-09-12 DIAGNOSIS — Z1283 Encounter for screening for malignant neoplasm of skin: Secondary | ICD-10-CM

## 2022-09-12 NOTE — Telephone Encounter (Signed)
New referral has been placed.  °

## 2022-09-12 NOTE — Telephone Encounter (Signed)
Left voicemail informing patient referral has been placed.

## 2022-09-12 NOTE — Telephone Encounter (Signed)
Pt called stating she visited Krystal Grant today, 09/12/22 & they are requesting a referral for today's date from [place] for insurance purposes? Due to pt having Medicaid, the office is needing a referral in order to process insurance claim for pt's visit. Call back # [phone]

## 2022-09-14 ENCOUNTER — Encounter: Payer: Self-pay | Admitting: Family Medicine

## 2022-09-14 ENCOUNTER — Ambulatory Visit (INDEPENDENT_AMBULATORY_CARE_PROVIDER_SITE_OTHER): Payer: Medicaid Other | Admitting: Family Medicine

## 2022-09-14 VITALS — BP 90/60 | HR 66 | Temp 98.2°F | Ht 63.0 in | Wt 122.1 lb

## 2022-09-14 DIAGNOSIS — M1990 Unspecified osteoarthritis, unspecified site: Secondary | ICD-10-CM

## 2022-09-14 DIAGNOSIS — M359 Systemic involvement of connective tissue, unspecified: Secondary | ICD-10-CM | POA: Diagnosis not present

## 2022-09-14 DIAGNOSIS — M2550 Pain in unspecified joint: Secondary | ICD-10-CM

## 2022-09-14 DIAGNOSIS — M357 Hypermobility syndrome: Secondary | ICD-10-CM

## 2022-09-14 NOTE — Progress Notes (Signed)
Krystal Fencl T. Blaize Nipper, MD, CAQ Sports Medicine [facility] [address] [place], [address]  Phone: [phone]  FAX: [phone]  Krystal Grant - 51 y.o. female  MRN [id number]  Date of Birth: 24-Jun-1971  Date: 09/14/2022  PCP: Doreene Nest, NP  Referral: Doreene Nest, NP  Chief Complaint  Patient presents with   Foot Pain    Bialteral   Hand Pain    Bialteral    Hip Pain    Bilateral    Back Pain    Lower   Neck Pain   Subjective:   Krystal Grant is a 51 y.o. very pleasant female patient with Body mass index is 21.63 kg/m. who presents with the following:  The patient is here to discuss with me multiple musculoskeletal complaints.  She is having diffuse polyarthralgia throughout most of her body.  It looks as if she has been evaluated by rheumatology in the past, and she also has been neurosurgery multiple times recently, and recently is soon as 3 weeks ago.  By report and on record review, it looks as if [facility] rheumatology had negative rheumatoid factor negative CCP antibodies on the patient.  She recently also had upper extremity EMG and nerve conduction velocities which were normal done by neurosurgery within the last year.  She is chronically on tramadol, Zanaflex, as well as Neurontin.  Neurosurgery has been managing her pain management.  She has also done physical therapy.  Does not think that she has fibromyalgia.   Did do MTX in [place] for 6 weeks.  While in [place], the rheumatologist felt as if she may have rheumatoid arthritis versus other rheumatological process, and she thinks that she may have gotten better on methotrexate, but she is not entirely sure.  She is hyperextensible at multiple joints including elbows, knees, as well as hypermobile at the wrist, thumbs, as well as able to put her hands flat on the floor.  Review of Systems is noted in the HPI, as appropriate  Patient Active  Problem List   Diagnosis Date Noted   Dyslipidemia 07/27/2022   Chest pain 07/06/2022   Fatigue 06/03/2022   Bilateral lower extremity edema 06/03/2022   Restless legs 06/03/2022   Pain in joint, multiple sites 06/24/2021   Chronic back pain 06/24/2021   Recurrent UTI 10/16/2020   Encounter for annual general medical examination with abnormal findings in adult 01/15/2019   Hypertension 01/02/2019   Vertigo 06/20/2018   GAD (generalized anxiety disorder) 06/20/2018   Arthritis 06/20/2018    Past Medical History:  Diagnosis Date   Acute appendicitis 09/18/2021   Acute non-recurrent sinusitis 06/04/2020   Arthritis    Carpal tunnel syndrome on right 03/15/2017   Common migraine with intractable migraine 01/25/2018   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    Past Surgical History:  Procedure Laterality Date   CARPAL TUNNEL RELEASE  06/28/2017   ELBOW ARTHROSCOPY  06/28/2017   XI ROBOTIC LAPAROSCOPIC ASSISTED APPENDECTOMY N/A 09/18/2021   Procedure: XI ROBOTIC LAPAROSCOPIC ASSISTED APPENDECTOMY;  Surgeon: Sung Amabile, DO;  Location: [facility] ORS;  Service: General;  Laterality: N/A;    Family History  Problem Relation Age of Onset   Migraines Mother    Hypertension Mother    Heart attack Mother 42   Nephrolithiasis Mother    Alcohol abuse Sister    Depression Sister    Arthritis Maternal Grandmother    Hearing loss Maternal Grandmother    Heart attack Maternal Grandfather    Cancer Paternal Grandmother        Breast   Hearing loss Paternal Grandmother    Heart attack Paternal Grandmother    Breast cancer Paternal Grandmother    Heart attack Paternal Grandfather    Asthma Son     Depression Son    Mental illness Son    Learning disabilities Son    Uterine cancer Maternal Aunt     Social History   Social History Narrative   Lives with husband and kids   Caffeine use: 2 per day   Right handed      Objective:   BP 90/60   Pulse 66   Temp 98.2 F (36.8 C) (Oral)   Ht 5\' 3"  (1.6 m)   Wt 122 lb 2 oz (55.4 kg)   SpO2 99%   BMI 21.63 kg/m   GEN: No acute distress; alert,appropriate. PULM: Breathing comfortably in no respiratory distress PSYCH: Normally interactive.   No obvious synovitis at the DIP, PIP, MCP joints or at the wrist. Full range of motion at the elbow with 5/5 strength Full range of motion at the shoulder with 5/5 strength  No effusion at the knee. No effusion at the ankle.  No pain with terminal motion, flexion, or joint line tenderness at the knee. 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 Pain in joint, multiple sites  M25.50     3. Arthritis  M19.90     4. 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 This includes total time spent on the day of encounter.   Extensive time spent on face-to-face interview, as well as review of records from [place] in [place], various subspecialist.  Hyperextensible elbows Patient is able to put her thumbs flat on her wrist bilaterally She does have hyperextensible knees She is able to put her palms flat on the floor She is unable to extend her fifth digit beyond 90 degrees  I think the very least  the patient has hypermobility syndrome.  With diffuse complaints, and she is also able to dislocate and relocate her hips per report, I worry that she may have a connective tissue disorder or an other genetic connective tissue tissue disorder that is driving her musculoskeletal complaints such as Ehlers-Danlos, Marfan's, or hypermobility unspecified.  Several rheumatologist do not think that the patient has rheumatoid arthritis, and she did have a rheumatologist in [place] who thought she might possibly have rheumatoid arthritis.  No active synovitis today, doubtful I think.  I am going to refer the patient to medical genetics for further connective tissue disorder testing.  If positive, she may need subspecialist care at  teaching hospital.  Orders placed today for conditions managed today: Orders Placed This Encounter  Procedures   Ambulatory referral to Genetics    Disposition: No follow-ups on file.  Dragon [employer] One speech-to-text software was used for transcription in this dictation.  Possible transcriptional errors can occur using Animal nutritionist.   Signed,  Elpidio Galea. Audrea Bolte, MD   Outpatient Encounter Medications as of 09/14/2022  Medication Sig   b complex vitamins capsule Take 1 capsule by mouth daily.   busPIRone (BUSPAR) 10 MG tablet TAKE 1 TABLET (10 MG TOTAL) BY MOUTH 2 (TWO) TIMES DAILY. FOR ANXIETY.   gabapentin (NEURONTIN) 300 MG capsule Take 300 mg by mouth 3 (three) times daily.   hydrOXYzine (VISTARIL) 25 MG capsule TAKE 1 CAPSULE BY MOUTH 2 TIMES DAILY AS NEEDED FOR ANXIETY.   ibuprofen (ADVIL) 800 MG tablet Take 1 tablet (800 mg total) by mouth every 8 (eight) hours as needed for mild pain or moderate pain.   lisinopril (ZESTRIL) 10 MG tablet Take 1 tablet (10 mg total) by mouth daily. for blood pressure.   metoprolol tartrate (LOPRESSOR) 50 MG tablet Take 2 hours prior to CT scan   Multiple Vitamins-Minerals (MULTIVITAMIN WITH MINERALS) tablet Take 1 tablet by  mouth daily.   mupirocin ointment (BACTROBAN) 2 % Place 1 application into the nose 2 (two) times daily.   tiZANidine (ZANAFLEX) 4 MG tablet Take by mouth.   traMADol (ULTRAM) 50 MG tablet TAKE 1 TABLET BY MOUTH EVERY 6 HOURS AS NEEDED FOR CHRONIC PAIN, 30 day supply   triamcinolone cream (KENALOG) 0.1 % Apply topically.   trimethoprim (TRIMPEX) 100 MG tablet Take 100 mg by mouth daily. As needed   No facility-administered encounter medications on file as of 09/14/2022.

## 2022-09-19 ENCOUNTER — Telehealth: Payer: Self-pay | Admitting: *Deleted

## 2022-09-19 NOTE — Telephone Encounter (Signed)
  -----   Message from Owens Loffler, MD sent at 09/19/2022  1:56 PM EDT ----- Can you see if she can be seen at the [facility] clinic?

## 2022-09-19 NOTE — Telephone Encounter (Signed)
-----   Message from Owens Loffler, MD sent at 09/19/2022  1:56 PM EDT ----- Can you see if she can be seen at the [facility] clinic?  ----- Message ----- From: Faith Rogue T Sent: 09/19/2022   1:52 PM EDT To: Owens Loffler, MD  Hi Dr. Lorelei Pont!  We received a genetics referral for this patient here at the [facility]. Unfortunately, we can't see patients for hypermobility here, we only see hereditary cancer consults. This patient could be seen at [facility] or [place] adult genetics clinics instead.    [facility] contact: Sharyne Peach, Glass blower/designer, at [phone]. Hours: 7:30 a.m. to 4:00 p.m., Monday through Friday Our fax number is [phone].  [facility] contact: [phone]   Thanks so much!! Faith Rogue, [place], [facility] Genetic Counselor [phone]

## 2022-09-22 ENCOUNTER — Other Ambulatory Visit: Payer: Self-pay | Admitting: Primary Care

## 2022-09-22 DIAGNOSIS — F411 Generalized anxiety disorder: Secondary | ICD-10-CM

## 2022-10-03 ENCOUNTER — Encounter: Payer: Self-pay | Admitting: *Deleted

## 2022-10-03 NOTE — Telephone Encounter (Signed)
Patient called in to follow up on her referral.

## 2022-10-03 NOTE — Telephone Encounter (Signed)
Per [facility]  Wallene Dales  09/19/22  01:45 PM  Specialty Comments- Pr Faith Rogue, pt need to be evalued by Medical Genetics at [facility] or [place]. She will notify the referring provider.    Referral is being faxed. Follow Referral for updated notes. Patient to be made aware once referral has been faxed.

## 2022-10-11 ENCOUNTER — Encounter: Payer: Self-pay | Admitting: Primary Care

## 2022-10-11 ENCOUNTER — Ambulatory Visit (INDEPENDENT_AMBULATORY_CARE_PROVIDER_SITE_OTHER): Payer: Medicaid Other | Admitting: Primary Care

## 2022-10-11 VITALS — BP 100/74 | HR 65 | Temp 97.3°F | Ht 63.0 in | Wt 120.0 lb

## 2022-10-11 DIAGNOSIS — R4189 Other symptoms and signs involving cognitive functions and awareness: Secondary | ICD-10-CM | POA: Diagnosis not present

## 2022-10-11 DIAGNOSIS — R5382 Chronic fatigue, unspecified: Secondary | ICD-10-CM

## 2022-10-11 DIAGNOSIS — F411 Generalized anxiety disorder: Secondary | ICD-10-CM | POA: Diagnosis not present

## 2022-10-11 DIAGNOSIS — R21 Rash and other nonspecific skin eruption: Secondary | ICD-10-CM | POA: Diagnosis not present

## 2022-10-11 LAB — CBC
HCT: 34.7 % — ABNORMAL LOW (ref 36.0–46.0)
Hemoglobin: 11.5 g/dL — ABNORMAL LOW (ref 12.0–15.0)
MCHC: 33.2 g/dL (ref 30.0–36.0)
MCV: 84.6 fl (ref 78.0–100.0)
Platelets: 368 10*3/uL (ref 150.0–400.0)
RBC: 4.1 Mil/uL (ref 3.87–5.11)
RDW: 14.7 % (ref 11.5–15.5)
WBC: 3.5 10*3/uL — ABNORMAL LOW (ref 4.0–10.5)

## 2022-10-11 LAB — MONONUCLEOSIS SCREEN: Mono Screen: NEGATIVE

## 2022-10-11 MED ORDER — FLUOXETINE HCL 10 MG PO CAPS: 10.0000 mg | ORAL_CAPSULE | Freq: Every day | ORAL | 0 refills | Status: DC

## 2022-10-11 NOTE — Progress Notes (Signed)
Subjective:    Patient ID: Krystal Grant, female    DOB: 03-08-1971, 51 y.o.   MRN: [id number]  HPI  Krystal Grant is a very pleasant 51 y.o. female with a history of hypertension, arthritis/chronic joint pain, GAD, chronic fatigue, restless legs, chronic back pain who presents today to discuss multiple symptoms.  History of chronic fatigue since January 2022 after contracting Covid-19, increased in April 2023 after contracting Covid-19 again. Also with intermittent "brain fog", generalized weakness. She finds herself staring into space often. Symptoms occur intermittently and bouts will last several days to one week. She has quit her job and is no longer participating in activities as she once did.   Her most recent bout of these symptoms began in early October 2023. During this time she began to develop a rash to the anterior and posterior trunk, right breast, lateral trunk. She saw her dermatologist recently who provided a cream and she's noticed improvement. She denies changes in soaps, detergents, medications, foods.   History of mononucleosis and EBV during her teenage years. She is taking vitamin B12 and B1, and vitamin D. This has helped somewhat with fatigue.   These symptoms have caused depression and increased anxiety. She has a history of post partum depression, took Zoloft which caused her to become "numb".    Review of Systems  Constitutional:  Positive for fatigue.  Gastrointestinal:  Negative for blood in stool.  Genitourinary:  Negative for vaginal bleeding.  Psychiatric/Behavioral:  The patient is nervous/anxious.        See HPI         Past Medical History:  Diagnosis Date   Acute appendicitis 09/18/2021   Acute non-recurrent sinusitis 06/04/2020   Arthritis    Carpal tunnel syndrome on right 03/15/2017   Common migraine with intractable migraine 01/25/2018   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    Social History   Socioeconomic History   Marital status: Married    Spouse name: Not on file   Number of children: 3   Years of education: 12   Highest education level: Not on file  Occupational History   Occupation: [employer]   Tobacco Use   Smoking status: Former    Packs/day: 1.00    Years: 20.00    Total pack years: 20.00    Types: Cigarettes    Quit date: 04/20/2018    Years since quitting: 4.4   Smokeless tobacco: Never  Vaping Use   Vaping Use: Every day  Substance and Sexual Activity   Alcohol use: No   Drug use: No   Sexual activity: Not on file  Other Topics Concern   Not on file  Social History Narrative   Lives with husband and kids   Caffeine use: 2 per day   Right handed    Social Determinants of Health   Financial Resource Strain: Not on file  Food Insecurity: Not on file  Transportation Needs: Not on file  Physical Activity: Not on file  Stress: Not on file  Social Connections: Not on file  Intimate Partner Violence: Not on file    Past Surgical History:  Procedure Laterality Date  CARPAL TUNNEL RELEASE  06/28/2017   ELBOW ARTHROSCOPY  06/28/2017   XI ROBOTIC LAPAROSCOPIC ASSISTED APPENDECTOMY N/A 09/18/2021   Procedure: XI ROBOTIC LAPAROSCOPIC ASSISTED APPENDECTOMY;  Surgeon: Sung Amabile, DO;  Location: [facility] ORS;  Service: General;  Laterality: N/A;    Family History  Problem Relation Age of Onset   Migraines Mother    Hypertension Mother    Heart attack Mother 57   Nephrolithiasis Mother    Alcohol abuse Sister    Depression Sister    Arthritis Maternal Grandmother    Hearing loss Maternal Grandmother    Heart attack Maternal Grandfather    Cancer Paternal Grandmother        Breast   Hearing loss  Paternal Grandmother    Heart attack Paternal Grandmother    Breast cancer Paternal Grandmother    Heart attack Paternal Grandfather    Asthma Son    Depression Son    Mental illness Son    Learning disabilities Son    Uterine cancer Maternal Aunt     Allergies  Allergen Reactions   Clindamycin Hcl    Cymbalta [Duloxetine Hcl]     disorientation   Iodine Other (See Comments)    Had a vaginal procedure using iodine- her vaginal burned and peeled    Current Outpatient Medications on File Prior to Visit  Medication Sig Dispense Refill   b complex vitamins capsule Take 1 capsule by mouth daily.     gabapentin (NEURONTIN) 300 MG capsule Take 300 mg by mouth 3 (three) times daily.     ibuprofen (ADVIL) 800 MG tablet Take 1 tablet (800 mg total) by mouth every 8 (eight) hours as needed for mild pain or moderate pain. 30 tablet 0   lisinopril (ZESTRIL) 10 MG tablet Take 1 tablet (10 mg total) by mouth daily. for blood pressure. 90 tablet 3   Multiple Vitamins-Minerals (MULTIVITAMIN WITH MINERALS) tablet Take 1 tablet by mouth daily.     mupirocin ointment (BACTROBAN) 2 % Place 1 application into the nose 2 (two) times daily. 22 g 0   tiZANidine (ZANAFLEX) 4 MG tablet Take by mouth.     traMADol (ULTRAM) 50 MG tablet TAKE 1 TABLET BY MOUTH EVERY 6 HOURS AS NEEDED FOR CHRONIC PAIN, 30 day supply     triamcinolone cream (KENALOG) 0.1 % Apply topically.     trimethoprim (TRIMPEX) 100 MG tablet Take 100 mg by mouth daily. As needed     busPIRone (BUSPAR) 10 MG tablet TAKE 1 TABLET (10 MG TOTAL) BY MOUTH 2 (TWO) TIMES DAILY. FOR ANXIETY. (Patient not taking: Reported on 10/11/2022) 180 tablet 3   hydrOXYzine (VISTARIL) 25 MG capsule TAKE 1 CAPSULE BY MOUTH 2 TIMES DAILY AS NEEDED FOR ANXIETY. (Patient not taking: Reported on 10/11/2022) 180 capsule 0   No current facility-administered medications on file prior to visit.    BP 100/74   Pulse 65   Temp (!) 97.3 F (36.3 C) (Temporal)   Ht  5\' 3"  (1.6 m)   Wt 120 lb (54.4 kg)   SpO2 99%   BMI 21.26 kg/m  Objective:   Physical Exam Cardiovascular:     Rate and Rhythm: Normal rate and regular rhythm.  Pulmonary:     Effort: Pulmonary effort is normal.     Breath sounds: Normal breath sounds.  Musculoskeletal:     Cervical back: Neck supple.  Skin:    General: Skin is warm and dry.     Findings: Rash present.  Comments: Several light red spots to right breast           Assessment & Plan:   Problem List Items Addressed This Visit       Other   GAD (generalized anxiety disorder)    Deteriorated with ongoing fatigue/other symptoms.   Start fluoxetine 10 mg daily. She will update in 4 weeks.       Relevant Medications   FLUoxetine (PROZAC) 10 MG capsule   Chronic fatigue - Primary    Likely secondary to Covid-19 as she seems to have a lot of the post Covid-19 symptoms.  Reviewed labs from this year including TSH, CBC, and vitamin levels.  Checking EBV, mononucleosis, CBC today.  Start fluoxetine 10 mg daily for depression symptoms.  She will update in 4 weeks.  Await results.       Relevant Medications   FLUoxetine (PROZAC) 10 MG capsule   Other Relevant Orders   Epstein-Barr virus VCA, IgG   Epstein-Barr virus VCA, IgM   B. burgdorfi antibodies by WB   Mononucleosis screen   CBC   Other Visit Diagnoses     Rash and nonspecific skin eruption       Relevant Orders   Epstein-Barr virus VCA, IgG   Epstein-Barr virus VCA, IgM   B. burgdorfi antibodies by WB   Mononucleosis screen   CBC   Brain fog       Relevant Orders   Epstein-Barr virus VCA, IgG   Epstein-Barr virus VCA, IgM   B. burgdorfi antibodies by WB   Mononucleosis screen   CBC          Doreene Nest, NP

## 2022-10-11 NOTE — Assessment & Plan Note (Addendum)
Likely secondary to Covid-19 as she seems to have a lot of the post Covid-19 symptoms.  Reviewed labs from this year including TSH, CBC, and vitamin levels.  Checking EBV, mononucleosis, CBC today.  Start fluoxetine 10 mg daily for depression symptoms.  She will update in 4 weeks.  Await results.

## 2022-10-11 NOTE — Assessment & Plan Note (Signed)
Deteriorated with ongoing fatigue/other symptoms.   Start fluoxetine 10 mg daily. She will update in 4 weeks.

## 2022-10-12 ENCOUNTER — Other Ambulatory Visit: Payer: Medicaid Other

## 2022-10-12 ENCOUNTER — Encounter: Payer: Medicaid Other | Admitting: Licensed Clinical Social Worker

## 2022-10-12 DIAGNOSIS — H5213 Myopia, bilateral: Secondary | ICD-10-CM | POA: Diagnosis not present

## 2022-10-13 ENCOUNTER — Telehealth: Payer: Self-pay

## 2022-10-13 NOTE — Telephone Encounter (Signed)
[id number] from [facility] called.  They are unable to add on iron studies to the labs that were drawn on this patient.  They do not have the correct tubes.

## 2022-10-14 ENCOUNTER — Other Ambulatory Visit: Payer: Self-pay | Admitting: Primary Care

## 2022-10-14 DIAGNOSIS — D649 Anemia, unspecified: Secondary | ICD-10-CM

## 2022-10-14 LAB — B. BURGDORFI ANTIBODIES BY WB

## 2022-10-14 LAB — EPSTEIN-BARR VIRUS VCA, IGM: EBV VCA IgM: <36 U/mL

## 2022-10-14 LAB — EPSTEIN-BARR VIRUS VCA, IGG: EBV VCA IgG: >750 U/mL — ABNORMAL HIGH

## 2022-10-24 ENCOUNTER — Other Ambulatory Visit (INDEPENDENT_AMBULATORY_CARE_PROVIDER_SITE_OTHER): Payer: Medicaid Other

## 2022-10-24 DIAGNOSIS — D649 Anemia, unspecified: Secondary | ICD-10-CM

## 2022-10-24 LAB — IBC + FERRITIN
Ferritin: 24.3 ng/mL (ref 10.0–291.0)
Iron: 58 ug/dL (ref 42–145)
Saturation Ratios: 16.8 % — ABNORMAL LOW (ref 20.0–50.0)
TIBC: 345.8 ug/dL (ref 250.0–450.0)
Transferrin: 247 mg/dL (ref 212.0–360.0)

## 2022-10-25 LAB — PATHOLOGIST SMEAR REVIEW

## 2022-11-09 NOTE — Telephone Encounter (Signed)
Patient called and stated she called the doctors office where the referral should be and they stated there is no referral there for Hematology.

## 2022-11-09 NOTE — Telephone Encounter (Signed)
Spoke to the office she states that she sent pt a letter stating they don't test for Hypermobility syndrome.   [facility] Located in: [facility] Address: [address] [place], [place] [address] Phone: [phone] Fax: [phone]   Do you know of any other location?  Krystal Grant

## 2022-11-10 NOTE — Telephone Encounter (Signed)
Noted and thank you for the hard work on this!

## 2022-11-10 NOTE — Telephone Encounter (Addendum)
See referral notes...  [facility] does not eval/treat hypermobility.  Called [facility] to follow up and discuss referral - the Genetics Dept does not eval/treat Marfans Syndrome/Hypermobility, the [facility] does -- But they possibly require an Echo showing a dilated aortic route before seeing the patient.  They can be reached at Phone: [phone], Fax: [phone]  [facility], spoke with Elnita Maxwell, The referral they have on file is closed. She reviewed the referral and stated that it was initially sent to the wrong dept ([facility] not treat the requested Dx) - she was able to pull the referral and send it to someone in that dept to review the referral and see if able to see this patient. If able, the First available is May 2024.  I left my contact information for a call back about this referral.  There is a possibility that some of the following will be needed in order to see the patient:   For connective tissue disorders (such as Marfan syndrome) -Rheumatology clinic notes -Echocardiogram report -Ophthalmology clinic notes -Relevant medical records for relatives with the same condition or symptoms (for example, clinic notes documenting a diagnosis in a relative, genetic test report for a relative, etc.)   Another option, Can try [facility] but they are booked out about 1 year. Called [facility] [phone] to see if able to see this patient.  They are about 4 months-1 year to be seen.  Fax: [phone]   Sending to Dr Patsy Lager and PCP Mayra Reel, NP as Lorain Childes that this is being worked on.

## 2022-11-10 NOTE — Telephone Encounter (Signed)
FYI see referral notes, also see message to the patient.

## 2022-11-22 ENCOUNTER — Other Ambulatory Visit: Payer: Self-pay | Admitting: Primary Care

## 2022-11-22 DIAGNOSIS — F411 Generalized anxiety disorder: Secondary | ICD-10-CM

## 2022-11-22 DIAGNOSIS — R5382 Chronic fatigue, unspecified: Secondary | ICD-10-CM

## 2022-11-25 ENCOUNTER — Telehealth: Payer: Medicaid Other | Admitting: Family Medicine

## 2022-11-25 DIAGNOSIS — R0789 Other chest pain: Secondary | ICD-10-CM

## 2022-11-25 NOTE — Progress Notes (Addendum)
[name]   Right sided chest wall lung pressure, tightness- in front apex and back and down to flank under rib cage.  In person is recommended  Patient acknowledged agreement and understanding of the plan.

## 2022-12-03 ENCOUNTER — Other Ambulatory Visit: Payer: Self-pay | Admitting: Primary Care

## 2022-12-03 DIAGNOSIS — F411 Generalized anxiety disorder: Secondary | ICD-10-CM

## 2022-12-08 ENCOUNTER — Encounter: Payer: Self-pay | Admitting: Primary Care

## 2022-12-08 ENCOUNTER — Ambulatory Visit (INDEPENDENT_AMBULATORY_CARE_PROVIDER_SITE_OTHER): Payer: Medicaid Other | Admitting: Primary Care

## 2022-12-08 VITALS — BP 110/74 | HR 67 | Temp 98.3°F | Ht 63.0 in | Wt 119.0 lb

## 2022-12-08 DIAGNOSIS — F411 Generalized anxiety disorder: Secondary | ICD-10-CM

## 2022-12-08 DIAGNOSIS — M2550 Pain in unspecified joint: Secondary | ICD-10-CM

## 2022-12-08 DIAGNOSIS — D649 Anemia, unspecified: Secondary | ICD-10-CM | POA: Diagnosis not present

## 2022-12-08 DIAGNOSIS — M47812 Spondylosis without myelopathy or radiculopathy, cervical region: Secondary | ICD-10-CM | POA: Diagnosis not present

## 2022-12-08 LAB — IBC + FERRITIN
Ferritin: 13.6 ng/mL (ref 10.0–291.0)
Iron: 50 ug/dL (ref 42–145)
Saturation Ratios: 14.2 % — ABNORMAL LOW (ref 20.0–50.0)
TIBC: 352.8 ug/dL (ref 250.0–450.0)
Transferrin: 252 mg/dL (ref 212.0–360.0)

## 2022-12-08 LAB — CBC
HCT: 33.6 % — ABNORMAL LOW (ref 36.0–46.0)
Hemoglobin: 11.3 g/dL — ABNORMAL LOW (ref 12.0–15.0)
MCHC: 33.5 g/dL (ref 30.0–36.0)
MCV: 85 fl (ref 78.0–100.0)
Platelets: 370 10*3/uL (ref 150.0–400.0)
RBC: 3.95 Mil/uL (ref 3.87–5.11)
RDW: 14.6 % (ref 11.5–15.5)
WBC: 3.8 10*3/uL — ABNORMAL LOW (ref 4.0–10.5)

## 2022-12-08 MED ORDER — FLUOXETINE HCL 20 MG PO CAPS: 20.0000 mg | ORAL_CAPSULE | Freq: Every day | ORAL | 0 refills | Status: DC

## 2022-12-08 NOTE — Patient Instructions (Signed)
We increased your dose of fluoxetine to 20 mg daily for anxiety.  Update me on your buspirone.   You will either be contacted via phone regarding your echocardiogram, or you may receive a letter on your MyChart portal from our referral team with instructions for scheduling an appointment. Please let us know if you have not been contacted by anyone within two weeks.  Stop by the lab prior to leaving today. I will notify you of your results once received.   It was a pleasure to see you today!

## 2022-12-08 NOTE — Assessment & Plan Note (Signed)
Improved, but unfortunately deteriorated with recent family event.  Increase fluoxetine to 20 mg daily. Continue buspirone 20 mg 3 times daily as needed. Consider new prescription for buspirone, she will update in a few weeks.

## 2022-12-08 NOTE — Progress Notes (Signed)
Subjective:    Patient ID: Krystal Grant, female    DOB: 1971/04/19, 52 y.o.   MRN: [id number]  Anxiety Symptoms include nervous/anxious behavior.      Krystal Grant is a very pleasant 52 y.o. 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 who presents today to discuss anxiety and evaluation for Ehlers Danlos Symptoms.  1) GAD: Currently prescribed fluoxetine 10 mg daily which was initiated in mid November 2023 for increased fatigue and anxiety. She is also prescribed buspirone 10 mg BID.   Since her last visit she is feeling better. Her fatigue has improved. She continues to experience anxiety which has increased significantly for the last few weeks due to a family issue.   She's having mind racing thoughts, "can't get out of my head". She's increased her buspirone to 20 mg TID which helps her to think more clearly. She denies negative reactions to fluoxetine.  2) Hypermobility/Chronic Joint Pain: Chronic. Evaluated by rheumatology, sports medicine, and neurosurgery without a clear cause for symptoms. She's questioned whether her symptoms are secondary to [facility] Danlos Syndrome. She was referred to the geneticist at [facility] in October 2023 who declined her referral recommending her PCP complete some preliminary testing. She brings a letter with her today.  BP Readings from Last 3 Encounters:  12/08/22 110/74  10/11/22 100/74  09/14/22 90/60     Review of Systems  Constitutional:  Positive  for fatigue.  Musculoskeletal:  Positive for arthralgias.  Psychiatric/Behavioral:  The patient is nervous/anxious.          Past Medical History:  Diagnosis Date   Acute appendicitis 09/18/2021   Acute non-recurrent sinusitis 06/04/2020   Arthritis    Carpal tunnel syndrome on right 03/15/2017   Common migraine with intractable migraine 01/25/2018   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    Social History   Socioeconomic History   Marital status: Married    Spouse name: Not on file   Number of children: 3   Years of education: 12   Highest education level: Not on file  Occupational History   Occupation: [employer]   Tobacco Use   Smoking status: Former    Packs/day: 1.00    Years: 20.00    Total pack years: 20.00    Types: Cigarettes    Quit date: 04/20/2018    Years since quitting: 4.6   Smokeless tobacco: Never  Vaping Use   Vaping Use: Every day  Substance and Sexual Activity   Alcohol use: No   Drug use: No   Sexual activity: Not on file  Other Topics Concern   Not on file  Social History Narrative   Lives with husband and kids   Caffeine use: 2 per day   Right handed    Social Determinants of Health   Financial Resource Strain: Not on file  Food Insecurity: Not on file  Transportation Needs: Not on file  Physical Activity: Not on file  Stress: Not on file  Social Connections: Not on file  Intimate Partner Violence: Not on file    Past Surgical History:  Procedure Laterality Date   CARPAL TUNNEL RELEASE  06/28/2017   ELBOW ARTHROSCOPY  06/28/2017   XI ROBOTIC LAPAROSCOPIC ASSISTED APPENDECTOMY N/A 09/18/2021   Procedure: XI ROBOTIC LAPAROSCOPIC ASSISTED  APPENDECTOMY;  Surgeon: Benjamine Sprague, DO;  Location: [facility] ORS;  Service: General;  Laterality: N/A;    Family History  Problem Relation Age of Onset   Migraines Mother    Hypertension Mother    Heart attack Mother 102   Nephrolithiasis Mother    Alcohol abuse Sister    Depression Sister    Arthritis Maternal Grandmother    Hearing loss Maternal Grandmother    Heart attack Maternal Grandfather    Cancer Paternal Grandmother        Breast   Hearing loss Paternal Grandmother    Heart attack Paternal Grandmother    Breast cancer Paternal Grandmother    Heart attack Paternal Grandfather    Asthma Son    Depression Son    Mental illness Son    Learning disabilities Son    Uterine cancer Maternal Aunt     Allergies  Allergen Reactions   Clindamycin Hcl    Cymbalta [Duloxetine Hcl]     disorientation   Iodine Other (See Comments)    Had a vaginal procedure using iodine- her vaginal burned and peeled    Current Outpatient Medications on File Prior to Visit  Medication Sig Dispense Refill   b complex vitamins capsule Take 1 capsule by mouth daily.     busPIRone (BUSPAR) 10 MG tablet TAKE 1 TABLET (10 MG TOTAL) BY MOUTH 2 (TWO) TIMES DAILY. FOR ANXIETY. 180 tablet 1   gabapentin (NEURONTIN) 300 MG capsule Take 300 mg by mouth 3 (three) times daily.     ibuprofen (ADVIL) 800 MG tablet Take 1 tablet (800 mg total) by mouth every 8 (eight) hours as needed for mild pain or moderate pain. 30 tablet 0   lisinopril (ZESTRIL) 10 MG tablet Take 1 tablet (10 mg total) by mouth daily. for blood pressure. 90 tablet 3   Multiple Vitamins-Minerals (MULTIVITAMIN WITH MINERALS) tablet Take 1 tablet by mouth daily.     mupirocin ointment (BACTROBAN) 2 % Place 1 application into the nose 2 (two) times daily. 22 g 0   tiZANidine (ZANAFLEX) 4 MG tablet Take by mouth.     traMADol (ULTRAM) 50 MG tablet TAKE 1 TABLET BY MOUTH EVERY 6 HOURS AS NEEDED FOR CHRONIC PAIN, 30 day supply     triamcinolone  cream (KENALOG) 0.1 % Apply topically.     hydrOXYzine (VISTARIL) 25 MG capsule TAKE 1 CAPSULE BY MOUTH 2 TIMES DAILY AS NEEDED FOR ANXIETY. (Patient not taking: Reported on 12/08/2022) 180 capsule 0   trimethoprim (TRIMPEX) 100 MG tablet Take 100 mg by mouth daily. As needed (Patient not taking: Reported on 12/08/2022)     No current facility-administered medications on file prior to visit.    BP 110/74   Pulse 67   Temp 98.3 F (36.8 C) (Temporal)   Ht 5\' 3"  (1.6 m)   Wt 119 lb (54 kg)   SpO2 99%   BMI 21.08 kg/m  Objective:   Physical Exam Cardiovascular:     Rate and Rhythm: Normal rate and regular rhythm.  Pulmonary:  Effort: Pulmonary effort is normal.     Breath sounds: Normal breath sounds.  Musculoskeletal:     Cervical back: Neck supple.  Skin:    General: Skin is warm and dry.           Assessment & Plan:  Hypermobility arthralgia Assessment & Plan: Reviewed office notes from sports medicine visit in October 2023.  Reviewed letter from [facility] regarding Ehlers-Danlos syndrome.  Will start by obtaining an echocardiogram for evaluation of aortic root.  Consider carotid ultrasound.  Await results.  Orders: -     ECHOCARDIOGRAM COMPLETE; Future  GAD (generalized anxiety disorder) Assessment & Plan: Improved, but unfortunately deteriorated with recent family event.  Increase fluoxetine to 20 mg daily. Continue buspirone 20 mg 3 times daily as needed. Consider new prescription for buspirone, she will update in a few weeks.  Orders: -     FLUoxetine HCl; Take 1 capsule (20 mg total) by mouth daily. For anxiety  Dispense: 90 capsule; Refill: 0  Anemia, unspecified type Assessment & Plan: Repeat iron and CBC pending.  Orders: -     CBC -     IBC + Ferritin        Pleas Koch, NP

## 2022-12-08 NOTE — Assessment & Plan Note (Signed)
Repeat iron and CBC pending.

## 2022-12-08 NOTE — Assessment & Plan Note (Signed)
Reviewed office notes from sports medicine visit in October 2023.  Reviewed letter from [facility] health regarding Ehlers-Danlos syndrome.  Will start by obtaining an echocardiogram for evaluation of aortic root.  Consider carotid ultrasound.  Await results.

## 2022-12-09 ENCOUNTER — Other Ambulatory Visit: Payer: Self-pay | Admitting: Primary Care

## 2022-12-09 DIAGNOSIS — D649 Anemia, unspecified: Secondary | ICD-10-CM

## 2022-12-09 DIAGNOSIS — F411 Generalized anxiety disorder: Secondary | ICD-10-CM

## 2022-12-09 DIAGNOSIS — D72819 Decreased white blood cell count, unspecified: Secondary | ICD-10-CM

## 2022-12-15 MED ORDER — BUSPIRONE HCL 10 MG PO TABS: 20.0000 mg | ORAL_TABLET | Freq: Two times a day (BID) | ORAL | 1 refills | Status: DC

## 2022-12-16 ENCOUNTER — Telehealth: Payer: Self-pay | Admitting: *Deleted

## 2022-12-16 NOTE — Telephone Encounter (Signed)
Nurse placed call to patient to review appointment details for upcoming new patient consultation visit. Patient denies any questions or concerns regarding visit.

## 2022-12-19 ENCOUNTER — Inpatient Hospital Stay: Payer: Medicaid Other | Attending: Internal Medicine | Admitting: Internal Medicine

## 2022-12-19 ENCOUNTER — Inpatient Hospital Stay: Payer: Medicaid Other

## 2022-12-19 VITALS — BP 127/88 | Temp 98.2°F | Wt 119.6 lb

## 2022-12-19 DIAGNOSIS — Z79899 Other long term (current) drug therapy: Secondary | ICD-10-CM | POA: Diagnosis not present

## 2022-12-19 DIAGNOSIS — R634 Abnormal weight loss: Secondary | ICD-10-CM | POA: Diagnosis not present

## 2022-12-19 DIAGNOSIS — D72819 Decreased white blood cell count, unspecified: Secondary | ICD-10-CM

## 2022-12-19 DIAGNOSIS — F17290 Nicotine dependence, other tobacco product, uncomplicated: Secondary | ICD-10-CM | POA: Insufficient documentation

## 2022-12-19 DIAGNOSIS — I10 Essential (primary) hypertension: Secondary | ICD-10-CM | POA: Insufficient documentation

## 2022-12-19 DIAGNOSIS — M797 Fibromyalgia: Secondary | ICD-10-CM | POA: Insufficient documentation

## 2022-12-19 DIAGNOSIS — D509 Iron deficiency anemia, unspecified: Secondary | ICD-10-CM

## 2022-12-19 HISTORY — DX: Decreased white blood cell count, unspecified: D72.819

## 2022-12-19 LAB — FOLATE: Folate: 20.8 ng/mL (ref >5.9)

## 2022-12-19 LAB — CBC WITH DIFFERENTIAL/PLATELET
Abs Immature Granulocytes: 0.01 10*3/uL (ref 0.00–0.07)
Basophils Absolute: 0.1 10*3/uL (ref 0.0–0.1)
Basophils Relative: 1 %
Eosinophils Absolute: 0.1 10*3/uL (ref 0.0–0.5)
Eosinophils Relative: 3 %
HCT: 36.2 % (ref 36.0–46.0)
Hemoglobin: 12.2 g/dL (ref 12.0–15.0)
Immature Granulocytes: 0 %
Lymphocytes Relative: 43 %
Lymphs Abs: 1.9 10*3/uL (ref 0.7–4.0)
MCH: 28.2 pg (ref 26.0–34.0)
MCHC: 33.7 g/dL (ref 30.0–36.0)
MCV: 83.6 fL (ref 80.0–100.0)
Monocytes Absolute: 0.2 10*3/uL (ref 0.1–1.0)
Monocytes Relative: 5 %
Neutro Abs: 2.1 10*3/uL (ref 1.7–7.7)
Neutrophils Relative %: 48 %
Platelets: 341 10*3/uL (ref 150–400)
RBC: 4.33 MIL/uL (ref 3.87–5.11)
RDW: 13.8 % (ref 11.5–15.5)
WBC: 4.4 10*3/uL (ref 4.0–10.5)
nRBC: 0 % (ref 0.0–0.2)

## 2022-12-19 LAB — TSH: TSH: 0.958 u[IU]/mL (ref 0.350–4.500)

## 2022-12-19 LAB — HEPATITIS B CORE ANTIBODY, IGM: Hep B C IgM: NONREACTIVE

## 2022-12-19 LAB — HEPATITIS C ANTIBODY: HCV Ab: NONREACTIVE

## 2022-12-19 LAB — HIV ANTIBODY (ROUTINE TESTING W REFLEX): HIV Screen 4th Generation wRfx: NONREACTIVE

## 2022-12-19 LAB — HEPATITIS B SURFACE ANTIGEN: Hepatitis B Surface Ag: NONREACTIVE

## 2022-12-19 LAB — VITAMIN B12: Vitamin B-12: 797 pg/mL (ref 180–914)

## 2022-12-19 NOTE — Progress Notes (Addendum)
[place]  [phone]) [phone] [phone]) [phone]  ID: Krystal Grant OB: Apr 22, 1971  MR#: [id number]  DPO#:[id number]  Patient Care Team: Pleas Koch, NP as PCP - General (Internal Medicine)  REFERRING PROVIDER: Alma Friendly  REASON FOR REFERRAL: Anemia and leukopenia  HPI: Krystal Grant is a 52 y.o. female with past medical history of hypertension, COVID-19, depression, fibromyalgia was referred to hematology for workup of anemia and leukopenia.  Patient reports she had [place] 1 year ago.  Since then she never felt back to her normal.  She has been having fatigue and shortness of breath on exertion.  She also developed a rash on her abdomen which eventually cleared but now has on shoulders and on the back.  She was also seen by dermatology and no clear etiology could be determined.  Her last menstrual period was in October 2023.  She has been having hot flashes.  She is also experiencing dull ache in her chest going into the joints and then a hot flash would come over.  She has diffuse joint pain and hyperextensibility of joint.  There is a concern for connective tissue disorder such as Ehlers Danlos syndrome.  Her PCP has been working her up starting with an echocardiogram.  Recently has noticed decreased appetite.  Has lost about 10 to 15 pounds since summer.  Labs were reviewed.  Hemoglobin 11.3.  WBC 3.8.  Previously 3.5.  Prior to that it was evaluated but in July 2023 had normalized.  Differential is not available.  Platelet was normal.  Smear was reviewed by pathologist which showed mature neutrophils with reactive changes and microcytic hypochromia.  Ferritin 13.  Saturation 14%.  Patient denies any bleeding in stool or urine.  Denies black tarry stool.    REVIEW OF SYSTEMS:   ROS  As per HPI. Otherwise, a complete review of systems is negative.  PAST MEDICAL HISTORY: Past Medical History:  Diagnosis Date   Acute appendicitis 09/18/2021    Acute non-recurrent sinusitis 06/04/2020   Arthritis    Carpal tunnel syndrome on right 03/15/2017   Common migraine with intractable migraine 01/25/2018   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 coli UTI (urinary tract infection) 07/04/2018   Fibromyalgia    History of chicken pox    History of shingles 2008   Hypertension    Mixed incontinence 08/27/2018   Seronegative rheumatoid arthritis ([place])    Urethral fistula 08/27/2018    PAST SURGICAL HISTORY: Past Surgical History:  Procedure Laterality Date   CARPAL TUNNEL RELEASE  06/28/2017   ELBOW ARTHROSCOPY  06/28/2017   XI ROBOTIC LAPAROSCOPIC ASSISTED APPENDECTOMY N/A 09/18/2021   Procedure: XI ROBOTIC LAPAROSCOPIC ASSISTED APPENDECTOMY;  Surgeon: Benjamine Sprague, DO;  Location: [facility] ORS;  Service: General;  Laterality: N/A;    FAMILY HISTORY: Family History  Problem Relation Age of Onset   Migraines Mother    Hypertension Mother    Heart attack Mother 47   Nephrolithiasis Mother    Alcohol abuse Sister    Depression Sister    Arthritis Maternal Grandmother    Hearing loss Maternal Grandmother    Heart attack Maternal Grandfather    Cancer Paternal Grandmother        Breast   Hearing loss Paternal Grandmother    Heart attack Paternal Grandmother    Breast cancer Paternal Grandmother    Heart attack Paternal Grandfather    Asthma Son    Depression Son  Mental illness Son    Learning disabilities Son    Uterine cancer Maternal Aunt     HEALTH MAINTENANCE: Social History   Tobacco Use   Smoking status: Former    Packs/day: 1.00    Years: 20.00    Total pack years: 20.00    Types: Cigarettes    Quit date: 04/20/2018    Years since quitting: 4.6   Smokeless tobacco: Never  Vaping Use   Vaping Use: Every day  Substance Use Topics   Alcohol use: No   Drug use: No     Allergies  Allergen Reactions   Clindamycin Hcl    Cymbalta [Duloxetine Hcl]      disorientation   Iodine Other (See Comments)    Had a vaginal procedure using iodine- her vaginal burned and peeled    Current Outpatient Medications  Medication Sig Dispense Refill   b complex vitamins capsule Take 1 capsule by mouth daily.     busPIRone (BUSPAR) 10 MG tablet Take 2 tablets (20 mg total) by mouth 2 (two) times daily. For anxiety. 360 tablet 1   FLUoxetine (PROZAC) 20 MG capsule Take 1 capsule (20 mg total) by mouth daily. For anxiety 90 capsule 0   gabapentin (NEURONTIN) 300 MG capsule Take 300 mg by mouth 3 (three) times daily.     hydrOXYzine (VISTARIL) 25 MG capsule TAKE 1 CAPSULE BY MOUTH 2 TIMES DAILY AS NEEDED FOR ANXIETY. (Patient not taking: Reported on 12/08/2022) 180 capsule 0   ibuprofen (ADVIL) 800 MG tablet Take 1 tablet (800 mg total) by mouth every 8 (eight) hours as needed for mild pain or moderate pain. 30 tablet 0   lisinopril (ZESTRIL) 10 MG tablet Take 1 tablet (10 mg total) by mouth daily. for blood pressure. 90 tablet 3   Multiple Vitamins-Minerals (MULTIVITAMIN WITH MINERALS) tablet Take 1 tablet by mouth daily.     mupirocin ointment (BACTROBAN) 2 % Place 1 application into the nose 2 (two) times daily. 22 g 0   tiZANidine (ZANAFLEX) 4 MG tablet Take by mouth.     traMADol (ULTRAM) 50 MG tablet TAKE 1 TABLET BY MOUTH EVERY 6 HOURS AS NEEDED FOR CHRONIC PAIN, 30 day supply     triamcinolone cream (KENALOG) 0.1 % Apply topically.     trimethoprim (TRIMPEX) 100 MG tablet Take 100 mg by mouth daily. As needed (Patient not taking: Reported on 12/08/2022)     No current facility-administered medications for this visit.    OBJECTIVE: Vitals:   12/19/22 1438  BP: 127/88  Temp: 98.2 F (36.8 C)     Body mass index is 21.19 kg/m.      General: Well-developed, well-nourished, no acute distress. Eyes: Pink conjunctiva, anicteric sclera. HEENT: Normocephalic, moist mucous membranes, clear oropharnyx. Lungs: Clear to auscultation bilaterally. Heart:  Regular rate and rhythm. No rubs, murmurs, or gallops. Abdomen: Soft, nontender, nondistended. No organomegaly noted, normoactive bowel sounds. Musculoskeletal: No edema, cyanosis, or clubbing. Neuro: Alert, answering all questions appropriately. Cranial nerves grossly intact. Skin: No rashes or petechiae noted. Psych: Normal affect. Lymphatics: No cervical, calvicular, axillary or inguinal LAD.   LAB RESULTS:  Lab Results  Component Value Date   NA 140 08/12/2022   K 3.7 08/12/2022   CL 102 08/12/2022   CO2 21 08/12/2022   GLUCOSE 89 08/12/2022   BUN 6 08/12/2022   CREATININE 0.93 08/12/2022   CALCIUM 9.5 08/12/2022   PROT 6.8 06/03/2022   ALBUMIN 4.4 06/03/2022   AST 20 06/03/2022  ALT 13 06/03/2022   ALKPHOS 48 06/03/2022   BILITOT 0.3 06/03/2022   GFRNONAA >60 09/19/2021   GFRAA >60 01/21/2018    Lab Results  Component Value Date   WBC 3.8 (L) 12/08/2022   NEUTROABS 11.0 (H) 09/18/2021   HGB 11.3 (L) 12/08/2022   HCT 33.6 (L) 12/08/2022   MCV 85.0 12/08/2022   PLT 370.0 12/08/2022    Lab Results  Component Value Date   TIBC 352.8 12/08/2022   TIBC 345.8 10/24/2022   FERRITIN 13.6 12/08/2022   FERRITIN 24.3 10/24/2022   FERRITIN 9.6 (L) 01/02/2019   IRONPCTSAT 14.2 (L) 12/08/2022   IRONPCTSAT 16.8 (L) 10/24/2022     STUDIES: No results found.  ASSESSMENT AND PLAN:   Krystal Grant is a 52 y.o. female with pmh of hypertension, COVID-19, depression, fibromyalgia was referred to hematology for workup of anemia and leukopenia.  # Iron deficiency anemia #Fatigue -Of unclear etiology.  Could not rule out microscopic GI bleed.  Hemoglobin 11.3.  Ferritin of 13.  I discussed with the patient about oral iron versus IV iron.  Side effects were discussed in detail including with oral iron constipation, diarrhea, nausea, GI upset.  With IV iron occasionally back pain and nausea.  Low but potential risk of anaphylactic reaction was discussed.  Patient has taken  oral iron when she was pregnant and had difficulty tolerating due to constipation.  She would like to proceed with IV iron.  Will schedule for IV Venofer weekly x 5.  -Will also make referral to GI to assess for any microscopic GI bleed.  Also she is very close to the age for screening colonoscopy.  Patient agreeable.  # Leukopenia -For past 2 time, WBC mildly low at 3.5 and 3.8.  Differential is not available. New and of unknown etiology. -Will start with the workup to rule out nutritional deficiency, autoimmune problem, viral infection and flow cytometry.  I will call the patient if I find any abnormality on the lab work.  # Weight loss # Decreased appetite -Patient noticed decreased appetite and weight loss of about 10 to 15 pounds since summer. -Will repeat TSH level and other labs as below.  # Joint pain, hyperextensibility of joint -Is being worked up by primary for connective tissue disorder such as Designer, fashion/clothing   Orders Placed This Encounter  Procedures   CBC with Differential   Vitamin B12   Folate   Hepatitis C antibody   Hepatitis B core antibody, IgM   Hepatitis B surface antigen   HIV ANTIBODY (ROUTINE TETSING W RELFEX)   Flow cytometry panel-leukemia/lymphoma work-up   ANA w/Reflex   TSH   Ambulatory referral to Gastroenterology   Schedule for IV Venofer weekly x 5 RTC in 4 months for MD visit, labs.  Cc Alma Friendly, thank you for your referral  Patient expressed understanding and was in agreement with this plan. She also understands that She can call clinic at any time with any questions, concerns, or complaints.   I spent a total of 45 minutes reviewing chart data, face-to-face evaluation with the patient, counseling and coordination of care as detailed above.  Jane Canary, MD   12/19/2022 3:28 PM

## 2022-12-19 NOTE — Addendum Note (Signed)
Addended byJane Canary on: 12/19/2022 03:38 PM   Modules accepted: Orders

## 2022-12-19 NOTE — Addendum Note (Signed)
Addended byJane Canary on: 12/19/2022 03:39 PM   Modules accepted: Orders

## 2022-12-20 LAB — ANA W/REFLEX: Anti Nuclear Antibody (ANA): NEGATIVE

## 2022-12-21 LAB — COMP PANEL: LEUKEMIA/LYMPHOMA

## 2022-12-27 ENCOUNTER — Inpatient Hospital Stay: Payer: Medicaid Other

## 2022-12-27 ENCOUNTER — Other Ambulatory Visit: Payer: Self-pay | Admitting: Primary Care

## 2022-12-27 VITALS — BP 118/78 | HR 60 | Temp 98.4°F | Resp 16

## 2022-12-27 DIAGNOSIS — F411 Generalized anxiety disorder: Secondary | ICD-10-CM

## 2022-12-27 DIAGNOSIS — D509 Iron deficiency anemia, unspecified: Secondary | ICD-10-CM | POA: Diagnosis not present

## 2022-12-27 MED ORDER — SODIUM CHLORIDE 0.9 % IV SOLN
Freq: Once | INTRAVENOUS | Status: AC
  Filled 2022-12-27: qty 250

## 2022-12-27 MED ORDER — SODIUM CHLORIDE 0.9 % IV SOLN
200.0000 mg | Freq: Once | INTRAVENOUS | Status: AC
  Administered 2022-12-27: 200 mg via INTRAVENOUS
  Filled 2022-12-27: qty 200

## 2022-12-27 NOTE — Patient Instructions (Signed)
Iron Sucrose Injection What is this medication? IRON SUCROSE (EYE ern SOO krose) treats low levels of iron (iron deficiency anemia) in people with kidney disease. Iron is a mineral that plays an important role in making red blood cells, which carry oxygen from your lungs to the rest of your body. This medicine may be used for other purposes; ask your health care provider or pharmacist if you have questions. COMMON BRAND NAME(S): Venofer What should I tell my care team before I take this medication? They need to know if you have any of these conditions: Anemia not caused by low iron levels Heart disease High levels of iron in the blood Kidney disease Liver disease An unusual or allergic reaction to iron, other medications, foods, dyes, or preservatives Pregnant or trying to get pregnant Breastfeeding How should I use this medication? This medication is for infusion into a vein. It is given in a hospital or clinic setting. Talk to your care team about the use of this medication in children. While this medication may be prescribed for children as young as 2 years for selected conditions, precautions do apply. Overdosage: If you think you have taken too much of this medicine contact a poison control center or emergency room at once. NOTE: This medicine is only for you. Do not share this medicine with others. What if I miss a dose? Keep appointments for follow-up doses. It is important not to miss your dose. Call your care team if you are unable to keep an appointment. What may interact with this medication? Do not take this medication with any of the following: Deferoxamine Dimercaprol Other iron products This medication may also interact with the following: Chloramphenicol Deferasirox This list may not describe all possible interactions. Give your health care provider a list of all the medicines, herbs, non-prescription drugs, or dietary supplements you use. Also tell them if you smoke,  drink alcohol, or use illegal drugs. Some items may interact with your medicine. What should I watch for while using this medication? Visit your care team regularly. Tell your care team if your symptoms do not start to get better or if they get worse. You may need blood work done while you are taking this medication. You may need to follow a special diet. Talk to your care team. Foods that contain iron include: whole grains/cereals, dried fruits, beans, or peas, leafy green vegetables, and organ meats (liver, kidney). What side effects may I notice from receiving this medication? Side effects that you should report to your care team as soon as possible: Allergic reactions--skin rash, itching, hives, swelling of the face, lips, tongue, or throat Low blood pressure--dizziness, feeling faint or lightheaded, blurry vision Shortness of breath Side effects that usually do not require medical attention (report to your care team if they continue or are bothersome): Flushing Headache Joint pain Muscle pain Nausea Pain, redness, or irritation at injection site This list may not describe all possible side effects. Call your doctor for medical advice about side effects. You may report side effects to FDA at 1-800-FDA-1088. Where should I keep my medication? This medication is given in a hospital or clinic and will not be stored at home. NOTE: This sheet is a summary. It may not cover all possible information. If you have questions about this medicine, talk to your doctor, pharmacist, or health care provider.  2023 Elsevier/Gold Standard (2021-02-25 00:00:00)

## 2023-01-02 ENCOUNTER — Inpatient Hospital Stay: Payer: Medicaid Other | Attending: Internal Medicine

## 2023-01-02 VITALS — BP 102/67 | HR 84 | Temp 98.9°F | Resp 18

## 2023-01-02 DIAGNOSIS — D509 Iron deficiency anemia, unspecified: Secondary | ICD-10-CM | POA: Insufficient documentation

## 2023-01-02 DIAGNOSIS — Z79899 Other long term (current) drug therapy: Secondary | ICD-10-CM | POA: Diagnosis not present

## 2023-01-02 MED ORDER — SODIUM CHLORIDE 0.9 % IV SOLN
Freq: Once | INTRAVENOUS | Status: AC
  Filled 2023-01-02: qty 250

## 2023-01-02 MED ORDER — SODIUM CHLORIDE 0.9 % IV SOLN
200.0000 mg | Freq: Once | INTRAVENOUS | Status: AC
  Administered 2023-01-02: 200 mg via INTRAVENOUS
  Filled 2023-01-02: qty 200

## 2023-01-02 NOTE — Patient Instructions (Signed)

## 2023-01-09 ENCOUNTER — Inpatient Hospital Stay: Payer: Medicaid Other

## 2023-01-09 VITALS — BP 121/79 | HR 59 | Temp 96.6°F | Resp 18

## 2023-01-09 DIAGNOSIS — D509 Iron deficiency anemia, unspecified: Secondary | ICD-10-CM | POA: Diagnosis not present

## 2023-01-09 MED ORDER — SODIUM CHLORIDE 0.9 % IV SOLN
Freq: Once | INTRAVENOUS | Status: AC
  Filled 2023-01-09: qty 250

## 2023-01-09 MED ORDER — SODIUM CHLORIDE 0.9 % IV SOLN
200.0000 mg | Freq: Once | INTRAVENOUS | Status: AC
  Administered 2023-01-09: 200 mg via INTRAVENOUS
  Filled 2023-01-09: qty 200

## 2023-01-13 MED FILL — Iron Sucrose Inj 20 MG/ML (Fe Equiv): INTRAVENOUS | Qty: 10 | Status: AC

## 2023-01-14 ENCOUNTER — Other Ambulatory Visit: Payer: Self-pay | Admitting: Primary Care

## 2023-01-14 DIAGNOSIS — F411 Generalized anxiety disorder: Secondary | ICD-10-CM

## 2023-01-16 ENCOUNTER — Inpatient Hospital Stay: Payer: Medicaid Other

## 2023-01-23 ENCOUNTER — Inpatient Hospital Stay: Payer: Medicaid Other

## 2023-01-23 VITALS — BP 118/77 | HR 61 | Temp 98.1°F | Resp 18

## 2023-01-23 DIAGNOSIS — D509 Iron deficiency anemia, unspecified: Secondary | ICD-10-CM

## 2023-01-23 MED ORDER — SODIUM CHLORIDE 0.9 % IV SOLN
200.0000 mg | Freq: Once | INTRAVENOUS | Status: AC
  Administered 2023-01-23: 200 mg via INTRAVENOUS
  Filled 2023-01-23: qty 200

## 2023-01-23 MED ORDER — SODIUM CHLORIDE 0.9 % IV SOLN
Freq: Once | INTRAVENOUS | Status: AC
  Filled 2023-01-23: qty 250

## 2023-02-02 ENCOUNTER — Other Ambulatory Visit: Payer: Self-pay | Admitting: Primary Care

## 2023-02-02 DIAGNOSIS — F411 Generalized anxiety disorder: Secondary | ICD-10-CM

## 2023-03-23 ENCOUNTER — Telehealth: Payer: Self-pay

## 2023-03-23 ENCOUNTER — Ambulatory Visit (INDEPENDENT_AMBULATORY_CARE_PROVIDER_SITE_OTHER): Payer: Medicaid Other | Admitting: Primary Care

## 2023-03-23 ENCOUNTER — Encounter: Payer: Self-pay | Admitting: *Deleted

## 2023-03-23 ENCOUNTER — Encounter: Payer: Self-pay | Admitting: Primary Care

## 2023-03-23 VITALS — BP 96/60 | HR 64 | Temp 98.1°F | Ht 63.0 in | Wt 126.0 lb

## 2023-03-23 DIAGNOSIS — G8929 Other chronic pain: Secondary | ICD-10-CM

## 2023-03-23 DIAGNOSIS — R5382 Chronic fatigue, unspecified: Secondary | ICD-10-CM

## 2023-03-23 DIAGNOSIS — M2550 Pain in unspecified joint: Secondary | ICD-10-CM | POA: Diagnosis not present

## 2023-03-23 DIAGNOSIS — N8110 Cystocele, unspecified: Secondary | ICD-10-CM | POA: Diagnosis not present

## 2023-03-23 DIAGNOSIS — N951 Menopausal and female climacteric states: Secondary | ICD-10-CM | POA: Diagnosis not present

## 2023-03-23 DIAGNOSIS — M25551 Pain in right hip: Secondary | ICD-10-CM | POA: Diagnosis not present

## 2023-03-23 DIAGNOSIS — M25552 Pain in left hip: Secondary | ICD-10-CM

## 2023-03-23 NOTE — Progress Notes (Signed)
Subjective:    Patient ID: Krystal Grant, female    DOB: 05/10/1971, 52 y.o.   MRN: [id number]  Hip Pain     Krystal Grant is a very pleasant 52 y.o. female with a history of hypertension, arthritis, chronic fatigue, chronic back pain, anemia who presents today to discuss multiple concerns.  1) Hip Pain: Chronic to the bilateral hips and lower back. She returned to work ing part time in early February 2024 and has noticed her hip pain has progressed. She works as a Building services engineer at [employer], is very active at work. She feels unstable and weak at times, almost fell yesterday.   She underwent MRI of her hips in [place] about 8 years ago which showed degeneration and bone spurs. She was told at the time that she would need hip replacements in the future. Her hips dislocate quite frequently.   2) Chronic Fatigue: Chronic and ongoing. Currently following with hematology for leukopenia and anemia. She underwent IV iron infusions on 01/30, 02/05, 02/12, and 02/26. She is due to see hematology again in 2 weeks for repeat labs and follow up.  She's undergone lab work previously for symptoms of fatigue including ANA, TSH, iron studies, EBV, Mononucleosis, HIV, hepatitis studies, folate, and tick borne illness.   She did notice an improvement in her fatigue during infusions, but after her infusion finished her fatigue returned.    Review of Systems  Constitutional:  Positive for fatigue.  Genitourinary:        Bladder and uterine prolapse  Musculoskeletal:  Positive for arthralgias.  Hematological:  Positive for adenopathy.         Past Medical History:  Diagnosis Date   Acute appendicitis 09/18/2021   Acute non-recurrent sinusitis 06/04/2020   Arthritis    Carpal tunnel syndrome on right 03/15/2017   Common migraine with intractable migraine 01/25/2018   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    Social History   Socioeconomic History   Marital status: Married    Spouse name: Not on file   Number of children: 3   Years of education: 12   Highest education level: Not on file  Occupational History   Occupation: [employer]   Tobacco Use   Smoking status: Former    Packs/day: 1.00    Years: 20.00    Additional pack years: 0.00    Total pack years: 20.00    Types: Cigarettes    Quit date: 04/20/2018    Years since quitting: 4.9   Smokeless tobacco: Never  Vaping Use   Vaping Use: Every day  Substance and Sexual Activity   Alcohol use: No   Drug use: No   Sexual activity: Not on file  Other Topics Concern   Not on file  Social History Narrative   Lives with husband and kids   Caffeine use: 2 per day   Right handed    Social Determinants of Health   Financial Resource Strain: Not on file  Food Insecurity: Not on file  Transportation Needs: Not on file  Physical Activity: Not on file  Stress: Not on file  Social Connections: Not on file  Intimate Partner Violence: Not  on file    Past Surgical History:  Procedure Laterality Date   CARPAL TUNNEL RELEASE  06/28/2017   ELBOW ARTHROSCOPY  06/28/2017   XI ROBOTIC LAPAROSCOPIC ASSISTED APPENDECTOMY N/A 09/18/2021   Procedure: XI ROBOTIC LAPAROSCOPIC ASSISTED APPENDECTOMY;  Surgeon: Sung Amabile, DO;  Location: [facility] ORS;  Service: General;  Laterality: N/A;    Family History  Problem Relation Age of Onset   Migraines Mother    Hypertension Mother    Heart attack Mother 55   Nephrolithiasis Mother    Alcohol abuse Sister    Depression Sister    Arthritis Maternal Grandmother    Hearing loss Maternal Grandmother    Heart attack Maternal Grandfather    Cancer Paternal Grandmother        Breast   Hearing loss  Paternal Grandmother    Heart attack Paternal Grandmother    Breast cancer Paternal Grandmother    Heart attack Paternal Grandfather    Asthma Son    Depression Son    Mental illness Son    Learning disabilities Son    Uterine cancer Maternal Aunt     Allergies  Allergen Reactions   Clindamycin Hcl    Cymbalta [Duloxetine Hcl]     disorientation   Iodine Other (See Comments)    Had a vaginal procedure using iodine- her vaginal burned and peeled    Current Outpatient Medications on File Prior to Visit  Medication Sig Dispense Refill   b complex vitamins capsule Take 1 capsule by mouth daily.     busPIRone (BUSPAR) 10 MG tablet Take 2 tablets (20 mg total) by mouth 2 (two) times daily. For anxiety. 360 tablet 1   FLUoxetine (PROZAC) 20 MG capsule TAKE 1 CAPSULE (20 MG TOTAL) BY MOUTH DAILY. FOR ANXIETY 90 capsule 0   gabapentin (NEURONTIN) 300 MG capsule Take 300 mg by mouth 3 (three) times daily.     ibuprofen (ADVIL) 800 MG tablet Take 1 tablet (800 mg total) by mouth every 8 (eight) hours as needed for mild pain or moderate pain. 30 tablet 0   lisinopril (ZESTRIL) 10 MG tablet Take 1 tablet (10 mg total) by mouth daily. for blood pressure. 90 tablet 3   Multiple Vitamins-Minerals (MULTIVITAMIN WITH MINERALS) tablet Take 1 tablet by mouth daily.     mupirocin ointment (BACTROBAN) 2 % Place 1 application into the nose 2 (two) times daily. 22 g 0   tiZANidine (ZANAFLEX) 4 MG tablet Take by mouth.     traMADol (ULTRAM) 50 MG tablet TAKE 1 TABLET BY MOUTH EVERY 6 HOURS AS NEEDED FOR CHRONIC PAIN, 30 day supply     triamcinolone cream (KENALOG) 0.1 % Apply topically.     hydrOXYzine (VISTARIL) 25 MG capsule TAKE 1 CAPSULE BY MOUTH 2 TIMES DAILY AS NEEDED FOR ANXIETY. (Patient not taking: Reported on 12/08/2022) 180 capsule 0   trimethoprim (TRIMPEX) 100 MG tablet Take 100 mg by mouth daily. As needed (Patient not taking: Reported on 12/08/2022)     No current facility-administered  medications on file prior to visit.    BP 96/60   Pulse 64   Temp 98.1 F (36.7 C) (Temporal)   Ht  (1.6 m)   Wt 126 lb (57.2 kg)   SpO2 100%   BMI 22.32 kg/m  Objective:   Physical Exam Cardiovascular:     Rate and Rhythm: Normal rate and regular rhythm.  Pulmonary:     Effort: Pulmonary effort is normal.     Breath  sounds: Normal breath sounds.  Musculoskeletal:     Cervical back: Neck supple.  Lymphadenopathy:     Cervical: Cervical adenopathy present.  Skin:    General: Skin is warm and dry.  Neurological:     Mental Status: She is alert.  Psychiatric:        Mood and Affect: Mood normal.           Assessment & Plan:  Chronic hip pain, bilateral Assessment & Plan: Xrays ordered and pending.  Referral placed for orthopedics.   Orders: -     Ambulatory referral to Orthopedic Surgery -     DG HIP UNILAT W OR W/O PELVIS 2-3 VIEWS LEFT; Future -     DG HIP UNILAT W OR W/O PELVIS 2-3 VIEWS RIGHT; Future  Chronic fatigue Assessment & Plan: Continued with brief improvement while undergoing iron infusions.  Follow up with hematology as scheduled. Referral placed to GYN for evaluation of fatigue in the setting of perimenopause.   Orders: -     Ambulatory referral to Obstetrics / Gynecology  Perimenopause -     Ambulatory referral to Obstetrics / Gynecology  Bladder prolapse, female, acquired -     Ambulatory referral to Obstetrics / Gynecology  Hypermobility arthralgia Assessment & Plan: Unclear why she has not been contacted for her echocardiogram.  Will have our referrals team investigate.  Need to evaluate aortic root for potential connective tissue disorder.         Doreene Nest, NP

## 2023-03-23 NOTE — Telephone Encounter (Signed)
No auth required per [facility]   Pt advised to contact [facility] to schedule [phone]

## 2023-03-23 NOTE — Assessment & Plan Note (Signed)
Xrays ordered and pending.  Referral placed for orthopedics.

## 2023-03-23 NOTE — Patient Instructions (Signed)
You will either be contacted via phone regarding your referral to orthopedics and GYN, or you may receive a letter on your MyChart portal from our referral team with instructions for scheduling an appointment. Please let us know if you have not been contacted by anyone within two weeks.  Please notify me if you do not hear from someone by early next week regarding scheduling your echocardiogram.   Return for xrays as discussed.  It was a pleasure to see you today!

## 2023-03-23 NOTE — Telephone Encounter (Signed)
Called patient and reviewed all information

## 2023-03-23 NOTE — Assessment & Plan Note (Signed)
Continued with brief improvement while undergoing iron infusions.  Follow up with hematology as scheduled. Referral placed to GYN for evaluation of fatigue in the setting of perimenopause.

## 2023-03-23 NOTE — Assessment & Plan Note (Signed)
Unclear why she has not been contacted for her echocardiogram.  Will have our referrals team investigate.  Need to evaluate aortic root for potential connective tissue disorder.

## 2023-03-23 NOTE — Telephone Encounter (Signed)
Could you check the status of the ECHOCARDIOGRAM COMPLETE Krystal Grant ordered on 12/08/22. The patient was never contacted. Thanks!

## 2023-03-24 ENCOUNTER — Ambulatory Visit (INDEPENDENT_AMBULATORY_CARE_PROVIDER_SITE_OTHER)
Admission: RE | Admit: 2023-03-24 | Discharge: 2023-03-24 | Disposition: A | Discharge status: Home / Self Care | Payer: Medicaid Other | Source: Ambulatory Visit | Attending: Primary Care | Admitting: Primary Care

## 2023-03-24 DIAGNOSIS — M25551 Pain in right hip: Secondary | ICD-10-CM | POA: Diagnosis not present

## 2023-03-24 DIAGNOSIS — M25552 Pain in left hip: Secondary | ICD-10-CM

## 2023-03-24 DIAGNOSIS — G8929 Other chronic pain: Secondary | ICD-10-CM | POA: Diagnosis not present

## 2023-04-03 DIAGNOSIS — M533 Sacrococcygeal disorders, not elsewhere classified: Secondary | ICD-10-CM | POA: Diagnosis not present

## 2023-04-03 DIAGNOSIS — M25551 Pain in right hip: Secondary | ICD-10-CM | POA: Diagnosis not present

## 2023-04-03 DIAGNOSIS — M545 Low back pain, unspecified: Secondary | ICD-10-CM | POA: Diagnosis not present

## 2023-04-03 DIAGNOSIS — M47816 Spondylosis without myelopathy or radiculopathy, lumbar region: Secondary | ICD-10-CM | POA: Diagnosis not present

## 2023-04-04 ENCOUNTER — Encounter: Payer: Self-pay | Admitting: Gastroenterology

## 2023-04-04 ENCOUNTER — Ambulatory Visit (INDEPENDENT_AMBULATORY_CARE_PROVIDER_SITE_OTHER): Payer: Medicaid Other | Admitting: Gastroenterology

## 2023-04-04 ENCOUNTER — Other Ambulatory Visit: Payer: Self-pay

## 2023-04-04 VITALS — BP 119/78 | HR 70 | Temp 98.2°F | Ht 63.0 in | Wt 127.0 lb

## 2023-04-04 DIAGNOSIS — M169 Osteoarthritis of hip, unspecified: Secondary | ICD-10-CM | POA: Insufficient documentation

## 2023-04-04 DIAGNOSIS — D509 Iron deficiency anemia, unspecified: Secondary | ICD-10-CM

## 2023-04-04 MED ORDER — ONDANSETRON HCL 4 MG PO TABS: 4.0000 mg | ORAL_TABLET | Freq: Three times a day (TID) | ORAL | 0 refills | Status: DC | PRN

## 2023-04-04 MED ORDER — NA SULFATE-K SULFATE-MG SULF 17.5-3.13-1.6 GM/177ML PO SOLN: 354.0000 mL | Freq: Once | ORAL | 0 refills | Status: AC

## 2023-04-04 NOTE — Progress Notes (Signed)
Arlyss Repress, MD [address]  [address]  [place], [place] [address]  Main: [phone]  Fax: [phone]    Gastroenterology Consultation  Referring Provider:     Michaelyn Barter, MD Primary Care Physician:  Doreene Nest, NP Primary Gastroenterologist:  Dr. Arlyss Repress Reason for Consultation: Chronic iron deficiency anemia        HPI:   Krystal Grant is a 52 y.o. female referred by Doreene Nest, NP  for consultation & management of chronic iron deficiency anemia.  Patient is diagnosed with anemia in April 2021, lowest hemoglobin 8.7 in 08/2021, serum ferritin was 9.6 in 12/2018.  She is evaluated by heme-onc, Dr. Michae Kava for iron deficiency anemia, received parenteral iron therapy.  This has resolved her anemia.  Most recent hemoglobin 12.2 in 11/2022.  Had history of acute appendicitis s/p appendectomy in 08/2021.  Patient has chronic fatigue, since January 2024, she received 5 injections of IV Venofer.  She feels better right after the infusions which she finished about a month ago.  Currently, reports ongoing fatigue.  She denies any GI symptoms.  Reports undergoing upper endoscopy and colonoscopy when she was in [place] more than 5 years ago for anemia Takes ibuprofen 600 mg daily after dinner for several years, for chronic pain from arthritis, has Ehlers-Danlos syndrome Sister with celiac disease Denies any family history of GI malignancy or inflammatory bowel disease She vapes  NSAIDs: Ibuprofen 600 mg daily for arthritis pain  Antiplts/Anticoagulants/Anti thrombotics: None  GI Procedures: EGD and colonoscopy approximately 8 years ago  Past Medical History:  Diagnosis Date   Acute appendicitis 09/18/2021   Acute non-recurrent sinusitis 06/04/2020   Arthritis    Carpal tunnel syndrome on right 03/15/2017   Common migraine with intractable migraine 01/25/2018   COVID-19 long hauler 12/23/2020   COVID-19 virus infection 03/24/2022   Depression     E. 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 For anxiety., Disp: 360 tablet, Rfl: 1   FLUoxetine (PROZAC) 20 MG capsule, TAKE 1 CAPSULE (20 MG TOTAL) BY MOUTH DAILY. FOR ANXIETY, Disp: 90 capsule, Rfl: 0   gabapentin (NEURONTIN) 300 MG capsule, Take 300 mg by mouth 3 (three) times daily., Disp: , Rfl:    hydrOXYzine (VISTARIL) 25 MG capsule, TAKE 1 CAPSULE BY MOUTH 2 TIMES DAILY AS NEEDED FOR ANXIETY., Disp: 180 capsule, Rfl: 0   ibuprofen (ADVIL) 800 MG tablet, Take 1 tablet (800 mg total) by mouth every 8 (eight) hours as needed for mild pain or moderate pain., Disp: 30 tablet, Rfl: 0   lisinopril (ZESTRIL) 10 MG tablet, Take 1 tablet (10 mg total) by mouth daily. for blood pressure., Disp: 90 tablet, Rfl: 3  Multiple Vitamins-Minerals (MULTIVITAMIN WITH MINERALS) tablet, Take 1 tablet by mouth daily., Disp: , Rfl:    mupirocin ointment (BACTROBAN) 2 %, Place 1 application into the nose 2 (two) times daily., Disp: 22 g, Rfl: 0   Na Sulfate-K Sulfate-Mg Sulf 17.5-3.13-1.6 GM/177ML SOLN, Take 354 mLs by mouth once for 1 dose., Disp: 354 mL, Rfl: 0   ondansetron (ZOFRAN) 4 MG tablet, Take 1 tablet (4 mg total) by mouth every 8 (eight) hours as needed for nausea or  vomiting., Disp: 10 tablet, Rfl: 0   tiZANidine (ZANAFLEX) 4 MG tablet, Take by mouth., Disp: , Rfl:    traMADol (ULTRAM) 50 MG tablet, TAKE 1 TABLET BY MOUTH EVERY 6 HOURS AS NEEDED FOR CHRONIC PAIN, 30 day supply, Disp: , Rfl:    triamcinolone cream (KENALOG) 0.1 %, Apply topically., Disp: , Rfl:    trimethoprim (TRIMPEX) 100 MG tablet, Take 100 mg by mouth daily. As needed, Disp: , Rfl:    Family History  Problem Relation Age of Onset   Migraines Mother    Hypertension Mother    Heart attack Mother 24   Nephrolithiasis Mother    Alcohol abuse Sister    Depression Sister    Arthritis Maternal Grandmother    Hearing loss Maternal Grandmother    Heart attack Maternal Grandfather    Cancer Paternal Grandmother        Breast   Hearing loss Paternal Grandmother    Heart attack Paternal Grandmother    Breast cancer Paternal Grandmother    Heart attack Paternal Grandfather    Asthma Son    Depression Son    Mental illness Son    Learning disabilities Son    Uterine cancer Maternal Aunt      Social History   Tobacco Use   Smoking status: Former    Packs/day: 1.00    Years: 20.00    Additional pack years: 0.00    Total pack years: 20.00    Types: Cigarettes    Quit date: 04/20/2018    Years since quitting: 4.9   Smokeless tobacco: Never  Vaping Use   Vaping Use: Every day  Substance Use Topics   Alcohol use: No   Drug use: No    Allergies as of 04/04/2023 - Review Complete 04/04/2023  Allergen Reaction Noted   Clindamycin hcl  03/07/2022   Cymbalta [duloxetine hcl]  06/20/2018   Iodine Other (See Comments) 03/26/2020    Review of Systems:    All systems reviewed and negative except where noted in HPI.   Physical Exam:  BP 119/78 (BP Location: Left Arm, Patient Position: Sitting, Cuff Size: Normal)   Pulse 70   Temp 98.2 F (36.8 C) (Oral)   Ht 5\' 3"  (1.6 m)   Wt 127 lb (57.6 kg)   LMP 09/14/2022 (Approximate)   BMI 22.50 kg/m  Patient's last menstrual  period was 09/14/2022 (approximate).  General:   Alert,  Well-developed, well-nourished, pleasant and cooperative in NAD Head:  Normocephalic and atraumatic. Eyes:  Sclera clear, no icterus.   Conjunctiva pink. Ears:  Normal auditory acuity. Nose:  No deformity, discharge, or lesions. Mouth:  No deformity or lesions,oropharynx pink & moist. Neck:  Supple; no masses or thyromegaly. Lungs:  Respirations even and unlabored.  Clear throughout to auscultation.   No wheezes, crackles, or rhonchi. No acute distress. Heart:  Regular rate and rhythm; no murmurs, clicks, rubs, or gallops. Abdomen:  Normal bowel sounds. Soft, non-tender and non-distended without masses, hepatosplenomegaly or  hernias noted.  No guarding or rebound tenderness.   Rectal: Not performed Msk:  Symmetrical without gross deformities. Good, equal movement & strength bilaterally. Pulses:  Normal pulses noted. Extremities:  No clubbing or edema.  No cyanosis. Neurologic:  Alert and oriented x3;  grossly normal neurologically. Skin:  Intact without significant lesions or rashes. No jaundice. Psych:  Alert and cooperative. Normal mood and affect.  Imaging Studies: Reviewed  Assessment and Plan:   Krystal Grant is a 52 y.o. pleasant Caucasian female with Ehlers-Danlos syndrome, chronic NSAID use, hypertension, chronic iron deficiency anemia, family history of celiac disease in sister  Recommend upper endoscopy and colonoscopy with possible video capsule endoscopy for further evaluation  Follow up based on the above workup   Arlyss Repress, MD

## 2023-04-06 ENCOUNTER — Encounter: Payer: Self-pay | Admitting: Gastroenterology

## 2023-04-11 ENCOUNTER — Telehealth: Payer: Self-pay

## 2023-04-11 NOTE — Telephone Encounter (Signed)
Approved [id number]  May 16th 2024 til June 30,2024

## 2023-04-11 NOTE — Progress Notes (Signed)
    GYNECOLOGY PROGRESS NOTE  Subjective:    Patient ID: Krystal Grant, female    DOB: 1971-08-04, 52 y.o.   MRN: [id number]  HPI  Patient is a 52 y.o. No obstetric history on file. female who presents for evaluation of bladder prolapse and perimenopausal symptoms. She was referred by her PCP Vernona Rieger, NP.   {Common ambulatory SmartLinks:19316}  Review of Systems {ros; complete:30496}   Objective:   Last menstrual period 09/14/2022. There is no height or weight on file to calculate BMI. General appearance: {general exam:16600} Abdomen: {abdominal exam:16834} Pelvic: {pelvic exam:16852::"cervix normal in appearance","external genitalia normal","no adnexal masses or tenderness","no cervical motion tenderness","rectovaginal septum normal","uterus normal size, shape, and consistency","vagina normal without discharge"} Extremities: {extremity exam:5109} Neurologic: {neuro exam:17854}   Assessment:   No diagnosis found.   Plan:   There are no diagnoses linked to this encounter.    Hildred Laser, MD [facility]

## 2023-04-12 ENCOUNTER — Ambulatory Visit (INDEPENDENT_AMBULATORY_CARE_PROVIDER_SITE_OTHER): Payer: Medicaid Other | Admitting: Obstetrics and Gynecology

## 2023-04-12 VITALS — BP 93/66 | HR 61 | Resp 16 | Ht 63.0 in | Wt 125.7 lb

## 2023-04-12 DIAGNOSIS — N393 Stress incontinence (female) (male): Secondary | ICD-10-CM

## 2023-04-12 DIAGNOSIS — N8110 Cystocele, unspecified: Secondary | ICD-10-CM | POA: Diagnosis not present

## 2023-04-12 DIAGNOSIS — N820 Vesicovaginal fistula: Secondary | ICD-10-CM

## 2023-04-12 DIAGNOSIS — N951 Menopausal and female climacteric states: Secondary | ICD-10-CM | POA: Diagnosis not present

## 2023-04-12 MED ORDER — PAROXETINE HCL 10 MG PO TABS
10.0000 mg | ORAL_TABLET | Freq: Every day | ORAL | 3 refills | Status: DC
Start: 2023-04-12 — End: 2023-08-16

## 2023-04-13 ENCOUNTER — Ambulatory Visit: Payer: Medicaid Other | Admitting: Anesthesiology

## 2023-04-13 ENCOUNTER — Other Ambulatory Visit: Payer: Self-pay

## 2023-04-13 ENCOUNTER — Encounter: Payer: Self-pay | Admitting: Gastroenterology

## 2023-04-13 ENCOUNTER — Ambulatory Visit
Admission: RE | Admit: 2023-04-13 | Discharge: 2023-04-13 | Disposition: A | Discharge status: Home / Self Care | Payer: Medicaid Other | Attending: Gastroenterology | Admitting: Gastroenterology

## 2023-04-13 ENCOUNTER — Encounter: Payer: Self-pay | Admitting: Obstetrics and Gynecology

## 2023-04-13 ENCOUNTER — Encounter
Admission: RE | Disposition: A | Discharge status: Home / Self Care | Payer: Self-pay | Source: Home / Self Care | Attending: Gastroenterology

## 2023-04-13 DIAGNOSIS — F32A Depression, unspecified: Secondary | ICD-10-CM | POA: Insufficient documentation

## 2023-04-13 DIAGNOSIS — D649 Anemia, unspecified: Secondary | ICD-10-CM | POA: Diagnosis not present

## 2023-04-13 DIAGNOSIS — I10 Essential (primary) hypertension: Secondary | ICD-10-CM | POA: Diagnosis not present

## 2023-04-13 DIAGNOSIS — D509 Iron deficiency anemia, unspecified: Secondary | ICD-10-CM | POA: Diagnosis present

## 2023-04-13 DIAGNOSIS — Z87891 Personal history of nicotine dependence: Secondary | ICD-10-CM | POA: Diagnosis not present

## 2023-04-13 DIAGNOSIS — M0600 Rheumatoid arthritis without rheumatoid factor, unspecified site: Secondary | ICD-10-CM | POA: Insufficient documentation

## 2023-04-13 DIAGNOSIS — F419 Anxiety disorder, unspecified: Secondary | ICD-10-CM | POA: Insufficient documentation

## 2023-04-13 DIAGNOSIS — K259 Gastric ulcer, unspecified as acute or chronic, without hemorrhage or perforation: Secondary | ICD-10-CM | POA: Insufficient documentation

## 2023-04-13 DIAGNOSIS — Z79899 Other long term (current) drug therapy: Secondary | ICD-10-CM | POA: Insufficient documentation

## 2023-04-13 DIAGNOSIS — D500 Iron deficiency anemia secondary to blood loss (chronic): Secondary | ICD-10-CM

## 2023-04-13 HISTORY — PX: ESOPHAGOGASTRODUODENOSCOPY (EGD) WITH PROPOFOL: SHX5813

## 2023-04-13 HISTORY — DX: Anxiety disorder, unspecified: F41.9

## 2023-04-13 HISTORY — DX: Other complications of anesthesia, initial encounter: T88.59XA

## 2023-04-13 HISTORY — PX: COLONOSCOPY WITH PROPOFOL: SHX5780

## 2023-04-13 LAB — POCT PREGNANCY, URINE: Preg Test, Ur: NEGATIVE

## 2023-04-13 SURGERY — COLONOSCOPY WITH PROPOFOL
Anesthesia: General | Site: Rectum

## 2023-04-13 MED ORDER — STERILE WATER FOR IRRIGATION IR SOLN
Status: DC | PRN
  Administered 2023-04-13: 120 mL

## 2023-04-13 MED ORDER — LACTATED RINGERS IV SOLN: INTRAVENOUS | Status: DC

## 2023-04-13 MED ORDER — PROPOFOL 10 MG/ML IV BOLUS
INTRAVENOUS | Status: DC | PRN
  Administered 2023-04-13 (×2): 40 mg via INTRAVENOUS
  Administered 2023-04-13: 80 mg via INTRAVENOUS
  Administered 2023-04-13: 40 mg via INTRAVENOUS
  Administered 2023-04-13: 100 mg via INTRAVENOUS
  Administered 2023-04-13: 40 mg via INTRAVENOUS
  Administered 2023-04-13 (×2): 20 mg via INTRAVENOUS
  Administered 2023-04-13: 80 mg via INTRAVENOUS
  Administered 2023-04-13: 40 mg via INTRAVENOUS

## 2023-04-13 MED ORDER — OMEPRAZOLE 40 MG PO CPDR: 40.0000 mg | DELAYED_RELEASE_CAPSULE | Freq: Two times a day (BID) | ORAL | 2 refills | Status: DC

## 2023-04-13 MED ORDER — LIDOCAINE HCL (CARDIAC) PF 100 MG/5ML IV SOSY
PREFILLED_SYRINGE | INTRAVENOUS | Status: DC | PRN
  Administered 2023-04-13: 60 mg via INTRAVENOUS

## 2023-04-13 MED ORDER — STERILE WATER FOR IRRIGATION IR SOLN
Status: DC | PRN
  Administered 2023-04-13: 150 mL

## 2023-04-13 MED ORDER — ONDANSETRON 4 MG PO TBDP
4.0000 mg | ORAL_TABLET | Freq: Once | ORAL | Status: AC
  Administered 2023-04-13: 4 mg via ORAL

## 2023-04-13 MED ORDER — SODIUM CHLORIDE 0.9 % IV SOLN: INTRAVENOUS | Status: DC

## 2023-04-13 SURGICAL SUPPLY — 36 items
BALLN DILATOR 12-15 8 (BALLOONS)
BALLN DILATOR 15-18 8 (BALLOONS)
BALLN DILATOR CRE 0-12 8 (BALLOONS)
BALLN DILATOR ESOPH 8 10 CRE (MISCELLANEOUS) IMPLANT
BALLOON DILATOR 12-15 8 (BALLOONS) IMPLANT
BALLOON DILATOR 15-18 8 (BALLOONS) IMPLANT
BALLOON DILATOR CRE 0-12 8 (BALLOONS) IMPLANT
BLOCK BITE 60FR ADLT L/F GRN (MISCELLANEOUS) ×2 IMPLANT
CLIP HMST 235XBRD CATH ROT (MISCELLANEOUS) IMPLANT
CLIP RESOLUTION 360 11X235 (MISCELLANEOUS)
ELECT REM PT RETURN 9FT ADLT (ELECTROSURGICAL)
ELECTRODE REM PT RTRN 9FT ADLT (ELECTROSURGICAL) IMPLANT
FCP ESCP3.2XJMB 240X2.8X (MISCELLANEOUS)
FORCEPS BIOP RAD 4 LRG CAP 4 (CUTTING FORCEPS) IMPLANT
FORCEPS BIOP RJ4 240 W/NDL (MISCELLANEOUS)
FORCEPS ESCP3.2XJMB 240X2.8X (MISCELLANEOUS) IMPLANT
GOWN CVR UNV OPN BCK APRN NK (MISCELLANEOUS) ×4 IMPLANT
GOWN ISOL THUMB LOOP REG UNIV (MISCELLANEOUS) ×4
INJECTOR VARIJECT VIN23 (MISCELLANEOUS) IMPLANT
KIT DEFENDO VALVE AND CONN (KITS) IMPLANT
KIT PRC NS LF DISP ENDO (KITS) ×2 IMPLANT
KIT PROCEDURE OLYMPUS (KITS) ×2
MANIFOLD NEPTUNE II (INSTRUMENTS) ×2 IMPLANT
MARKER SPOT ENDO TATTOO 5ML (MISCELLANEOUS) IMPLANT
PROBE APC STR FIRE (PROBE) IMPLANT
RETRIEVER NET PLAT FOOD (MISCELLANEOUS) IMPLANT
RETRIEVER NET ROTH 2.5X230 LF (MISCELLANEOUS) IMPLANT
SNARE COLD EXACTO (MISCELLANEOUS) IMPLANT
SNARE SHORT THROW 13M SML OVAL (MISCELLANEOUS) IMPLANT
SNARE SHORT THROW 30M LRG OVAL (MISCELLANEOUS) IMPLANT
SNARE SNG USE RND 15MM (INSTRUMENTS) IMPLANT
SYR INFLATION 60ML (SYRINGE) IMPLANT
TRAP ETRAP POLY (MISCELLANEOUS) IMPLANT
VARIJECT INJECTOR VIN23 (MISCELLANEOUS)
WATER STERILE IRR 250ML POUR (IV SOLUTION) ×2 IMPLANT
WIRE CRE 18-20MM 8CM F G (MISCELLANEOUS) IMPLANT

## 2023-04-13 NOTE — Op Note (Signed)
[facility] Gastroenterology Patient Name: Krystal Grant Procedure Date: 04/13/2023 9:26 AM MRN: [id number] Account #: [id number] Date of Birth: 02-Jan-1971 Admit Type: Outpatient Age: 52 Room: [facility] OR ROOM 01 Gender: Female Note Status: Finalized Instrument Name: [id number] Procedure:             Colonoscopy Indications:           Unexplained iron deficiency anemia Providers:             Toney Reil MD, MD Referring MD:          Doreene Nest (Referring MD) Medicines:             General Anesthesia Complications:         No immediate complications. Estimated blood loss: None. Procedure:             Pre-Anesthesia Assessment:                        - Prior to the procedure, a History and Physical was                         performed, and patient medications and allergies were                         reviewed. The patient is competent. The risks and                         benefits of the procedure and the sedation options and                         risks were discussed with the patient. All questions                         were answered and informed consent was obtained.                         Patient identification and proposed procedure were                         verified by the physician, the nurse, the                         anesthesiologist, the anesthetist and the technician                         in the pre-procedure area in the procedure room in the                         endoscopy suite. Mental Status Examination: alert and                         oriented. Airway Examination: normal oropharyngeal                         airway and neck mobility. Respiratory Examination:                         clear to auscultation. CV Examination: normal.  Prophylactic Antibiotics: The patient does not require                         prophylactic antibiotics. Prior Anticoagulants: The                         patient has taken no  anticoagulant or antiplatelet                         agents. ASA Grade Assessment: II - A patient with mild                         systemic disease. After reviewing the risks and                         benefits, the patient was deemed in satisfactory                         condition to undergo the procedure. The anesthesia                         plan was to use general anesthesia. Immediately prior                         to administration of medications, the patient was                         re-assessed for adequacy to receive sedatives. The                         heart rate, respiratory rate, oxygen saturations,                         blood pressure, adequacy of pulmonary ventilation, and                         response to care were monitored throughout the                         procedure. The physical status of the patient was                         re-assessed after the procedure.                        After obtaining informed consent, the colonoscope was                         passed under direct vision. Throughout the procedure,                         the patient's blood pressure, pulse, and oxygen                         saturations were monitored continuously. The                         Colonoscope was introduced through the anus and  advanced to the 10 cm into the ileum. The colonoscopy                         was performed without difficulty. The patient                         tolerated the procedure well. The quality of the bowel                         preparation was evaluated using the BBPS [facility] Bowel                         Preparation Scale) with scores of: Right Colon = 3,                         Transverse Colon = 3 and Left Colon = 3 (entire mucosa                         seen well with no residual staining, small fragments                         of stool or opaque liquid). The total BBPS score                         equals 9.  The terminal ileum, ileocecal valve,                         appendiceal orifice, and rectum were photographed. Findings:      The perianal and digital rectal examinations were normal. Pertinent       negatives include normal sphincter tone and no palpable rectal lesions.      The terminal ileum appeared normal.      The entire examined colon appeared normal. Impression:            - The examined portion of the ileum was normal.                        - The entire examined colon is normal.                        - No specimens collected. Recommendation:        - Discharge patient to home (with escort).                        - Resume previous diet today.                        - Continue present medications.                        - Repeat colonoscopy in 10 years for screening                         purposes. Procedure Code(s):     --- Professional ---                        [phone], Colonoscopy, flexible; diagnostic, including  collection of specimen(s) by brushing or washing, when                         performed (separate procedure) Diagnosis Code(s):     --- Professional ---                        D50.9, Iron deficiency anemia, unspecified CPT copyright 2022 American Medical Association. All rights reserved. The codes documented in this report are preliminary and upon coder review may  be revised to meet current compliance requirements. Dr. Libby Maw Toney Reil MD, MD 04/13/2023 10:11:55 AM This report has been signed electronically. Number of Addenda: 0 Note Initiated On: 04/13/2023 9:26 AM Scope Withdrawal Time: 0 hours 9 minutes 0 seconds  Total Procedure Duration: 0 hours 13 minutes 5 seconds  Estimated Blood Loss:  Estimated blood loss: none.      [facility]

## 2023-04-13 NOTE — H&P (Signed)
Arlyss Repress, MD [address]  [address]  [place], [place] [address]  Main: [phone]  Fax: [phone] Pager: [phone]  Primary Care Physician:  Doreene Nest, NP Primary Gastroenterologist:  Dr. Arlyss Repress  Pre-Procedure History & Physical: HPI:  Krystal Grant is a 52 y.o. female is here for an endoscopy and colonoscopy.   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 Authorizing Provider  b complex vitamins capsule Take 1 capsule by mouth daily.   Yes [provider]  busPIRone (BUSPAR) 10 MG tablet Take 2 tablets (20 mg total) by mouth 2 (two) times daily. For anxiety. 12/15/22  Yes Doreene Nest, NP  FLUoxetine (PROZAC) 20 MG capsule TAKE 1 CAPSULE (20 MG TOTAL) BY MOUTH DAILY. FOR ANXIETY 02/04/23  Yes Doreene Nest, NP  gabapentin (NEURONTIN) 300 MG capsule Take 300 mg by mouth 3 (three) times daily. 07/01/22  Yes [provider]  hydrOXYzine (VISTARIL) 25 MG capsule TAKE 1 CAPSULE BY MOUTH 2 TIMES DAILY AS NEEDED FOR ANXIETY. 09/22/22  Yes Doreene Nest, NP  ibuprofen (ADVIL) 800 MG tablet Take 1 tablet (800 mg total) by mouth every 8 (eight) hours as needed for mild pain or moderate pain. 09/19/21  Yes Sakai, Isami, DO  lisinopril (ZESTRIL) 10 MG tablet Take 1 tablet (10 mg total) by mouth daily. for blood pressure. 06/23/22  Yes Doreene Nest, NP  Multiple Vitamins-Minerals (MULTIVITAMIN WITH MINERALS) tablet Take 1 tablet by mouth daily.   Yes [provider]  mupirocin ointment (BACTROBAN) 2 % Place 1 application into the nose 2 (two) times daily. 02/26/20  Yes Doreene Nest, NP  ondansetron (ZOFRAN) 4 MG tablet Take 1 tablet (4 mg total) by mouth every 8 (eight) hours as needed for nausea or vomiting. 04/04/23  Yes Aleria Maheu, Loel Dubonnet, MD  PARoxetine (PAXIL) 10 MG tablet Take 1 tablet (10 mg total) by mouth daily. 04/12/23  Yes Hildred Laser, MD  tiZANidine (ZANAFLEX) 4 MG tablet Take by mouth. 04/05/22  Yes [provider]  triamcinolone cream (KENALOG) 0.1 % Apply topically. 09/12/22  Yes [provider]  trimethoprim (TRIMPEX) 100 MG tablet Take 100 mg by mouth daily. As needed 04/27/21  Yes [provider]    Allergies as of 04/04/2023 - Review Complete 04/04/2023  Allergen Reaction Noted   Clindamycin hcl  03/07/2022   Cymbalta [duloxetine hcl]  06/20/2018   Iodine Other (See  Comments) 03/26/2020    Family History  Problem Relation Age of Onset   Migraines Mother    Hypertension Mother    Heart attack Mother 77   Nephrolithiasis Mother    Alcohol abuse Sister    Depression Sister    Arthritis Maternal Grandmother    Hearing loss Maternal Grandmother    Heart attack Maternal Grandfather    Cancer Paternal Grandmother        Breast   Hearing loss Paternal Grandmother    Heart attack Paternal Grandmother    Breast cancer Paternal Grandmother     Heart attack Paternal Grandfather    Asthma Son    Depression Son    Mental illness Son    Learning disabilities Son    Uterine cancer Maternal Aunt     Social History   Socioeconomic History   Marital status: Married    Spouse name: Not on file   Number of children: 3   Years of education: 12   Highest education level: Not on file  Occupational History   Occupation: [employer]   Tobacco Use   Smoking status: Former    Packs/day: 1.00    Years: 20.00    Additional pack years: 0.00    Total pack years: 20.00    Types: Cigarettes    Quit date: 04/20/2018    Years since quitting: 4.9   Smokeless tobacco: Never  Vaping Use   Vaping Use: Every day  Substance and Sexual Activity   Alcohol use: No   Drug use: No   Sexual activity: Not on file  Other Topics Concern   Not on file  Social History Narrative   Lives with husband and kids   Caffeine use: 2 per day   Right handed    Social Determinants of Health   Financial Resource Strain: Not on file  Food Insecurity: Not on file  Transportation Needs: Not on file  Physical Activity: Not on file  Stress: Not on file  Social Connections: Not on file  Intimate Partner Violence: Not on file    Review of Systems: See HPI, otherwise negative ROS  Physical Exam: BP (!) 76/50   Pulse (!) 59   Temp 97.7 F (36.5 C)   Resp 17   Ht 5\' 3"  (1.6 m)   Wt 124 lb (56.2 kg)   LMP 09/14/2022 (Approximate)   SpO2 98%   BMI 21.97 kg/m  General:   Alert,  pleasant and cooperative in NAD Head:  Normocephalic and atraumatic. Neck:  Supple; no masses or thyromegaly. Lungs:  Clear throughout to auscultation.    Heart:  Regular rate and rhythm. Abdomen:  Soft, nontender and nondistended. Normal bowel sounds, without guarding, and without rebound.   Neurologic:  Alert and  oriented x4;  grossly normal neurologically.  Impression/Plan: Krystal Grant is here for an endoscopy and colonoscopy to be performed for IDA  Risks,  benefits, limitations, and alternatives regarding  endoscopy and colonoscopy have been reviewed with the patient.  Questions have been answered.  All parties agreeable.   Lannette Donath, MD  04/13/2023, 10:21 AM

## 2023-04-13 NOTE — Anesthesia Preprocedure Evaluation (Addendum)
Anesthesia Evaluation  Patient identified by MRN, date of birth, ID band Patient awake    Reviewed: Allergy & Precautions, H&P , NPO status , Patient's Chart, lab work & pertinent test results  History of Anesthesia Complications (+) history of anesthetic complications  Airway Mallampati: I  TM Distance: >3 FB Neck ROM: Full    Dental no notable dental hx.    Pulmonary neg pulmonary ROS, former smoker   Pulmonary exam normal breath sounds clear to auscultation       Cardiovascular hypertension, Normal cardiovascular exam Rhythm:Regular Rate:Normal     Neuro/Psych  Headaches  Anxiety Depression    Anxious today due to previous hx of awareness during colonoscopy; assured patient we will make every effort today for her to be unaware during all of procedure except bite block.   Neuromuscular disease  negative psych ROS   GI/Hepatic negative GI ROS, Neg liver ROS,,,  Endo/Other  negative endocrine ROS    Renal/GU negative Renal ROS  negative genitourinary   Musculoskeletal negative musculoskeletal ROS (+)    Abdominal   Peds negative pediatric ROS (+)  Hematology negative hematology ROS (+)   Anesthesia Other Findings Carpal tunnel syndrome on right Common migraine with intractable migraine Arthritis  History of chicken pox History of shingles  Depression Seronegative rheumatoid arthritis (HCC) Urethral fistula Mixed incontinence E. coli UTI (urinary tract infection) COVID-19 long hauler COVID-19 virus infection Acute appendicitis  Acute non-recurrent sinusitis Hypertension  Bilateral lower extremity edema Leukopenia  Complication of anesthesia Anxiety     Reproductive/Obstetrics negative OB ROS                             Anesthesia Physical Anesthesia Plan  ASA: 2  Anesthesia Plan: General   Post-op Pain Management:    Induction: Intravenous  PONV Risk Score and Plan:    Airway Management Planned: Natural Airway and Nasal Cannula  Additional Equipment:   Intra-op Plan:   Post-operative Plan:   Informed Consent: I have reviewed the patients History and Physical, chart, labs and discussed the procedure including the risks, benefits and alternatives for the proposed anesthesia with the patient or authorized representative who has indicated his/her understanding and acceptance.     Dental Advisory Given  Plan Discussed with: Anesthesiologist, CRNA and Surgeon  Anesthesia Plan Comments: (Patient consented for risks of anesthesia including but not limited to:  - adverse reactions to medications - risk of airway placement if required - damage to eyes, teeth, lips or other oral mucosa - nerve damage due to positioning  - sore throat or hoarseness - Damage to heart, brain, nerves, lungs, other parts of body or loss of life  Patient voiced understanding.)        Anesthesia Quick Evaluation

## 2023-04-13 NOTE — Transfer of Care (Signed)
Immediate Anesthesia Transfer of Care Note  Patient: Krystal Grant  Procedure(s) Performed: COLONOSCOPY WITH PROPOFOL (Rectum) ESOPHAGOGASTRODUODENOSCOPY (EGD) WITH PROPOFOL (Mouth)  Patient Location: PACU  Anesthesia Type: General  Level of Consciousness: awake, alert  and patient cooperative  Airway and Oxygen Therapy: Patient Spontanous Breathing and Patient connected to supplemental oxygen  Post-op Assessment: Post-op Vital signs reviewed, Patient's Cardiovascular Status Stable, Respiratory Function Stable, Patent Airway and No signs of Nausea or vomiting  Post-op Vital Signs: Reviewed and stable  Complications: No notable events documented.

## 2023-04-13 NOTE — Op Note (Signed)
[facility] Patient Name: Krystal Grant Procedure Date: 04/13/2023 9:29 AM MRN: [id number] Account #: [id number] Date of Birth: [birth year] Admit Type: Outpatient Age: 52 Room: [facility] OR ROOM 01 Gender: Female Note Status: Finalized Instrument Name: [id number] Procedure:             Upper GI endoscopy Indications:           Unexplained iron deficiency anemia Providers:             Toney Reil MD, MD Referring MD:          Doreene Nest (Referring MD) Medicines:             General Anesthesia Complications:         No immediate complications. Estimated blood loss: None. Procedure:             Pre-Anesthesia Assessment:                        - Prior to the procedure, a History and Physical was                         performed, and patient medications and allergies were                         reviewed. The patient is competent. The risks and                         benefits of the procedure and the sedation options and                         risks were discussed with the patient. All questions                         were answered and informed consent was obtained.                         Patient identification and proposed procedure were                         verified by the physician, the nurse, the                         anesthesiologist, the anesthetist and the technician                         in the pre-procedure area in the procedure room in the                         endoscopy suite. Mental Status Examination: alert and                         oriented. Airway Examination: normal oropharyngeal                         airway and neck mobility. Respiratory Examination:                         clear to auscultation. CV Examination: normal.  Prophylactic Antibiotics: The patient does not require                         prophylactic antibiotics. Prior Anticoagulants: The                         patient has  taken no anticoagulant or antiplatelet                         agents. ASA Grade Assessment: II - A patient with mild                         systemic disease. After reviewing the risks and                         benefits, the patient was deemed in satisfactory                         condition to undergo the procedure. The anesthesia                         plan was to use general anesthesia. Immediately prior                         to administration of medications, the patient was                         re-assessed for adequacy to receive sedatives. The                         heart rate, respiratory rate, oxygen saturations,                         blood pressure, adequacy of pulmonary ventilation, and                         response to care were monitored throughout the                         procedure. The physical status of the patient was                         re-assessed after the procedure.                        After obtaining informed consent, the endoscope was                         passed under direct vision. Throughout the procedure,                         the patient's blood pressure, pulse, and oxygen                         saturations were monitored continuously. The was                         introduced through the mouth, and advanced to the  second part of duodenum. The upper GI endoscopy was                         accomplished without difficulty. The patient tolerated                         the procedure well. Findings:      The duodenal bulb and second portion of the duodenum were normal.       Biopsies were taken with a cold forceps for histology.      One non-bleeding superficial gastric ulcer with a clean ulcer base with       raised edges, likely healing (Forrest Class III) was found on the       greater curvature of the gastric antrum. The lesion was 10 mm in largest       dimension. Biopsies were taken with a cold forceps for  histology.      The gastric body and incisura were normal. Biopsies were taken with a       cold forceps for histology.      The cardia and gastric fundus were normal on retroflexion.      The gastroesophageal junction and examined esophagus were normal. Impression:            - Normal duodenal bulb and second portion of the                         duodenum. Biopsied.                        - Non-bleeding gastric ulcer with a clean ulcer base                         (Forrest Class III). Biopsied.                        - Normal gastric body and incisura. Biopsied.                        - Normal gastroesophageal junction and esophagus. Recommendation:        - Await pathology results.                        - Use Prilosec (omeprazole) 40 mg PO BID for 3 months.                        - No aspirin, ibuprofen, naproxen, or other                         non-steroidal anti-inflammatory drugs.                        - Repeat upper endoscopy in 3 months to check healing.                        - Proceed with colonoscopy as scheduled                        See colonoscopy report Procedure Code(s):     --- Professional ---  40981, Esophagogastroduodenoscopy, flexible,                         transoral; with biopsy, single or multiple Diagnosis Code(s):     --- Professional ---                        D50.9, Iron deficiency anemia, unspecified                        K25.9, Gastric ulcer, unspecified as acute or chronic,                         without hemorrhage or perforation CPT copyright 2022 [employer]. All rights reserved. The codes documented in this report are preliminary and upon coder review may  be revised to meet current compliance requirements. Dr. Libby Maw Toney Reil MD, MD 04/13/2023 9:55:36 AM This report has been signed electronically. Number of Addenda: 0 Note Initiated On: 04/13/2023 9:29 AM Total Procedure Duration: 0 hours  7 minutes 19 seconds  Estimated Blood Loss:  Estimated blood loss: none.      [facility]

## 2023-04-13 NOTE — Patient Instructions (Signed)
Perimenopause Perimenopause is the normal time of a woman's life when the levels of estrogen, the female hormone produced by the ovaries, begin to decrease. This leads to changes in menstrual periods before they stop completely (menopause). Perimenopause can begin 2-8 years before menopause. During perimenopause, the ovaries may or may not produce an egg and a woman can still become pregnant. What are the causes? This condition is caused by a natural change in hormone levels that happens as you get older. What increases the risk? This condition is more likely to start at an earlier age if you have certain medical conditions or have undergone treatments, including: A tumor of the pituitary gland in the brain. A disease that affects the ovaries and hormone production. Certain cancer treatments, such as chemotherapy or hormone therapy, or radiation therapy on the pelvis. Heavy smoking and excessive alcohol use. Family history of early menopause. What are the signs or symptoms? Perimenopausal changes affect each woman differently. Symptoms of this condition may include: Hot flashes. Irregular menstrual periods. Night sweats. Changes in feelings about sex. This could be a decrease in sex drive or an increased discomfort around your sexuality. Vaginal dryness. Headaches. Mood swings. Depression. Problems sleeping (insomnia). Memory problems or trouble concentrating. Irritability. Tiredness. Weight gain. Anxiety. Trouble getting pregnant. How is this diagnosed? This condition is diagnosed based on your medical history, a physical exam, your age, your menstrual history, and your symptoms. Hormone tests may also be done. How is this treated? In some cases, no treatment is needed. You and your health care provider should make a decision together about whether treatment is necessary. Treatment will be based on your individual condition and preferences. Various treatments are available, such  as: Menopausal hormone therapy (MHT). Medicines to treat specific symptoms. Acupuncture. Vitamin or herbal supplements. Before starting treatment, make sure to let your health care provider know if you have a personal or family history of: Heart disease. Breast cancer. Blood clots. Diabetes. Osteoporosis. Follow these instructions at home: Medicines Take over-the-counter and prescription medicines only as told by your health care provider. Take vitamin supplements only as told by your health care provider. Talk with your health care provider before starting any herbal supplements. Lifestyle  Do not use any products that contain nicotine or tobacco, such as cigarettes, e-cigarettes, and chewing tobacco. If you need help quitting, ask your health care provider. Get at least 30 minutes of physical activity on 5 or more days each week. Eat a balanced diet that includes fresh fruits and vegetables, whole grains, soybeans, eggs, lean meat, and low-fat dairy. Avoid alcoholic and caffeinated beverages, as well as spicy foods. This may help prevent hot flashes. Get 7-8 hours of sleep each night. Dress in layers that can be removed to help you manage hot flashes. Find ways to manage stress, such as deep breathing, meditation, or journaling. General instructions  Keep track of your menstrual periods, including: When they occur. How heavy they are and how long they last. How much time passes between periods. Keep track of your symptoms, noting when they start, how often you have them, and how long they last. Use vaginal lubricants or moisturizers to help with vaginal dryness and improve comfort during sex. You can still become pregnant if you are having irregular periods. Make sure you use contraception during perimenopause if you do not want to get pregnant. Keep all follow-up visits. This is important. This includes any group therapy or counseling. Contact a health care provider if: You  have   heavy vaginal bleeding or pass blood clots. Your period lasts more than 2 days longer than normal. Your periods are recurring sooner than 21 days. You bleed after having sex. You have pain during sex. Get help right away if you have: Chest pain, trouble breathing, or trouble talking. Severe depression. Pain when you urinate. Severe headaches. Vision problems. Summary Perimenopause is the time when a woman's body begins to move into menopause. This may happen naturally or as a result of other health problems or medical treatments. Perimenopause can begin 2-8 years before menopause, and it can last for several years. Perimenopausal symptoms can be managed through medicines, lifestyle changes, and complementary therapies such as acupuncture. This information is not intended to replace advice given to you by your health care provider. Make sure you discuss any questions you have with your health care provider. Document Revised: 04/30/2020 Document Reviewed: 04/30/2020 [employer]vier Patient Education  2023 [employer]  

## 2023-04-13 NOTE — Anesthesia Postprocedure Evaluation (Signed)
Anesthesia Post Note  Patient: Krystal Grant  Procedure(s) Performed: COLONOSCOPY WITH PROPOFOL (Rectum) ESOPHAGOGASTRODUODENOSCOPY (EGD) WITH PROPOFOL (Mouth)  Patient location during evaluation: PACU Anesthesia Type: General Level of consciousness: awake and alert Pain management: pain level controlled Vital Signs Assessment: post-procedure vital signs reviewed and stable Respiratory status: spontaneous breathing, nonlabored ventilation, respiratory function stable and patient connected to nasal cannula oxygen Cardiovascular status: blood pressure returned to baseline and stable Postop Assessment: no apparent nausea or vomiting Anesthetic complications: no   No notable events documented.   Last Vitals:  Vitals:   04/13/23 1043 04/13/23 1045  BP:  (!) 86/54  Pulse:  (!) 51  Resp: 20 17  Temp:  36.5 C  SpO2: 100% 100%    Last Pain:  Vitals:   04/13/23 1040  TempSrc:   PainSc: 0-No pain                 Symir Mah C Kashia Brossard

## 2023-04-14 ENCOUNTER — Encounter: Payer: Self-pay | Admitting: Gastroenterology

## 2023-04-17 DIAGNOSIS — M5416 Radiculopathy, lumbar region: Secondary | ICD-10-CM | POA: Diagnosis not present

## 2023-04-17 DIAGNOSIS — M7061 Trochanteric bursitis, right hip: Secondary | ICD-10-CM | POA: Diagnosis not present

## 2023-04-17 DIAGNOSIS — G959 Disease of spinal cord, unspecified: Secondary | ICD-10-CM | POA: Diagnosis not present

## 2023-04-17 DIAGNOSIS — M7062 Trochanteric bursitis, left hip: Secondary | ICD-10-CM | POA: Diagnosis not present

## 2023-04-18 ENCOUNTER — Inpatient Hospital Stay: Payer: Medicaid Other | Attending: Internal Medicine

## 2023-04-18 ENCOUNTER — Inpatient Hospital Stay (HOSPITAL_BASED_OUTPATIENT_CLINIC_OR_DEPARTMENT_OTHER): Payer: Medicaid Other | Admitting: Internal Medicine

## 2023-04-18 VITALS — BP 109/66 | HR 72 | Temp 97.7°F | Wt 124.7 lb

## 2023-04-18 DIAGNOSIS — E611 Iron deficiency: Secondary | ICD-10-CM | POA: Diagnosis present

## 2023-04-18 DIAGNOSIS — R5383 Other fatigue: Secondary | ICD-10-CM | POA: Insufficient documentation

## 2023-04-18 DIAGNOSIS — D649 Anemia, unspecified: Secondary | ICD-10-CM | POA: Diagnosis not present

## 2023-04-18 DIAGNOSIS — M2550 Pain in unspecified joint: Secondary | ICD-10-CM | POA: Diagnosis not present

## 2023-04-18 DIAGNOSIS — D509 Iron deficiency anemia, unspecified: Secondary | ICD-10-CM

## 2023-04-18 LAB — CBC WITH DIFFERENTIAL/PLATELET
Abs Immature Granulocytes: 0.01 10*3/uL (ref 0.00–0.07)
Basophils Absolute: 0.1 10*3/uL (ref 0.0–0.1)
Basophils Relative: 1 %
Eosinophils Absolute: 0.1 10*3/uL (ref 0.0–0.5)
Eosinophils Relative: 2 %
HCT: 31.5 % — ABNORMAL LOW (ref 36.0–46.0)
Hemoglobin: 10.3 g/dL — ABNORMAL LOW (ref 12.0–15.0)
Immature Granulocytes: 0 %
Lymphocytes Relative: 26 %
Lymphs Abs: 1.6 10*3/uL (ref 0.7–4.0)
MCH: 28.7 pg (ref 26.0–34.0)
MCHC: 32.7 g/dL (ref 30.0–36.0)
MCV: 87.7 fL (ref 80.0–100.0)
Monocytes Absolute: 0.3 10*3/uL (ref 0.1–1.0)
Monocytes Relative: 5 %
Neutro Abs: 4.1 10*3/uL (ref 1.7–7.7)
Neutrophils Relative %: 66 %
Platelets: 287 10*3/uL (ref 150–400)
RBC: 3.59 MIL/uL — ABNORMAL LOW (ref 3.87–5.11)
RDW: 13.7 % (ref 11.5–15.5)
WBC: 6.1 10*3/uL (ref 4.0–10.5)
nRBC: 0 % (ref 0.0–0.2)

## 2023-04-18 LAB — IRON AND TIBC
Iron: 53 ug/dL (ref 28–170)
Saturation Ratios: 17 % (ref 10.4–31.8)
TIBC: 314 ug/dL (ref 250–450)
UIBC: 261 ug/dL

## 2023-04-18 LAB — FERRITIN: Ferritin: 57 ng/mL (ref 11–307)

## 2023-04-18 NOTE — Progress Notes (Signed)
Patient is having more trouble falling asleep. 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 More recent loss of appetite.

## 2023-04-18 NOTE — Progress Notes (Signed)
[facility]  [phone] [phone]  ID: Krystal Grant OB: Jun 09, 1971  MR#: [id number]  AOZ#:[id number]  Patient Care Team: Doreene Nest, NP as PCP - General (Internal Medicine)   HPI: Krystal Grant is a 52 y.o. female with past medical history of hypertension, COVID-19, depression, fibromyalgia was referred to hematology for workup of anemia and leukopenia.  Patient reports she had COVID 1 year ago.  Since then she never felt back to her normal.  She has been having fatigue and shortness of breath on exertion.  She also developed a rash on her abdomen which eventually cleared but now has on shoulders and on the back.  She was also seen by dermatology and no clear etiology could be determined.  Her last menstrual period was in October 2023.  She has been having hot flashes.  She is also experiencing dull ache in her chest going into the joints and then a hot flash would come over.  She has diffuse joint pain and hyperextensibility of joint.  There is a concern for connective tissue disorder such as Ehlers Danlos syndrome.  Her PCP has been working her up starting with an echocardiogram.  Recently has noticed decreased appetite.  Has lost about 10 to 15 pounds since summer.  Labs were reviewed.  Hemoglobin 11.3.  WBC 3.8.  Previously 3.5.  Prior to that it was evaluated but in July 2023 had normalized.  Differential is not available.  Platelet was normal.  Smear was reviewed by pathologist which showed mature neutrophils with reactive changes and microcytic hypochromia.  Ferritin 13.  Saturation 14%.  Patient denies any bleeding in stool or urine.  Denies black tarry stool.  Had colonoscopy with Dr. Allegra Lai on 04/13/2023 was normal.  Upper endoscopy showed 1 nonbleeding superficial gastric ulcer with clean ulcer base likely healing on the greater curvature of the stomach.  Pathology pending.  She was started on Prilosec 40 mg twice daily for 3 months.  Was  advised repeat endoscopy in 3 months to assess for healing.  Interval history Patient was seen today as follow-up for labs. Completed IV Venofer 200 mg x 4 doses in February 2024.  Completed GI workup with Dr. Allegra Lai.  Was found to have nonbleeding stomach ulcer on upper endoscopy.  Was started on omeprazole twice daily and was recommended to repeat in 3 months.  Patient continues to feel fatigue.  She has mild energy level with iron infusions but they were transient.  He is seeing Dr. Valentino Saxon and was started on paroxetine 20 mg daily for her chronic fatigue which may be coming from menopause.  She feels very low energy in the morning and reports that she feels like she is dragging through mud.  Which is all new for her.  REVIEW OF SYSTEMS:   ROS  As per HPI. Otherwise, a complete review of systems is negative.  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    PAST SURGICAL HISTORY: Past Surgical History:  Procedure Laterality Date   CARPAL TUNNEL RELEASE  06/28/2017   COLONOSCOPY WITH PROPOFOL N/A 04/13/2023   Procedure: COLONOSCOPY WITH PROPOFOL;  Surgeon: Toney Reil, MD;  Location: [facility];  Service: Endoscopy;  Laterality: N/A;   ELBOW ARTHROSCOPY  06/28/2017   ESOPHAGOGASTRODUODENOSCOPY (EGD) WITH PROPOFOL N/A 04/13/2023   Procedure:  ESOPHAGOGASTRODUODENOSCOPY (EGD) WITH PROPOFOL;  Surgeon: Toney Reil, MD;  Location: [facility];  Service: Endoscopy;  Laterality: N/A;   XI ROBOTIC LAPAROSCOPIC ASSISTED APPENDECTOMY N/A 09/18/2021   Procedure: XI ROBOTIC LAPAROSCOPIC ASSISTED APPENDECTOMY;  Surgeon: Sung Amabile, DO;  Location: [facility];  Service: General;  Laterality: N/A;    FAMILY HISTORY: Family History  Problem Relation Age of Onset   Migraines Mother    Hypertension Mother    Heart attack Mother 18   Nephrolithiasis Mother    Alcohol abuse Sister    Depression Sister    Arthritis Maternal Grandmother    Hearing loss Maternal Grandmother    Heart attack Maternal Grandfather    Cancer Paternal Grandmother        Breast   Hearing loss Paternal Grandmother    Heart attack Paternal Grandmother    Breast cancer Paternal Grandmother    Heart attack Paternal Grandfather    Asthma Son    Depression Son    Mental illness Son    Learning disabilities Son    Uterine cancer Maternal Aunt     HEALTH MAINTENANCE: Social History   Tobacco Use   Smoking status: Former    Packs/day: 1.00    Years: 20.00    Additional pack years: 0.00    Total pack years: 20.00    Types: Cigarettes    Quit date: 04/20/2018    Years since quitting: 4.9   Smokeless tobacco: Never  Vaping Use   Vaping Use: Every day  Substance Use Topics   Alcohol use: No   Drug use: No     Allergies  Allergen Reactions   Clindamycin Hcl    Cymbalta [Duloxetine Hcl]     disorientation   Iodine Other (See Comments)    Had a vaginal procedure using iodine- her vaginal burned and peeled    Current Outpatient Medications  Medication Sig Dispense Refill   b complex vitamins capsule Take 1 capsule by mouth daily.     busPIRone (BUSPAR) 10 MG tablet Take 2 tablets (20 mg total) by mouth 2 (two) times daily. For anxiety. 360 tablet 1   FLUoxetine (PROZAC) 20 MG capsule TAKE 1 CAPSULE (20 MG TOTAL) BY MOUTH DAILY. FOR ANXIETY  90 capsule 0   gabapentin (NEURONTIN) 300 MG capsule Take 300 mg by mouth 3 (three) times daily.     hydrOXYzine (VISTARIL) 25 MG capsule TAKE 1 CAPSULE BY MOUTH 2 TIMES DAILY AS NEEDED FOR ANXIETY. 180 capsule 0   ibuprofen (ADVIL) 800 MG tablet Take 1 tablet (800 mg total) by mouth every 8 (eight) hours as needed for mild pain or moderate pain. 30 tablet 0   lisinopril (ZESTRIL) 10 MG tablet Take 1 tablet (10 mg total) by mouth daily. for blood pressure. 90 tablet 3   Multiple Vitamins-Minerals (MULTIVITAMIN WITH MINERALS) tablet Take 1 tablet by mouth daily.     mupirocin ointment (BACTROBAN) 2 % Place 1 application into the nose 2 (two) times daily. 22 g 0  omeprazole (PRILOSEC) 40 MG capsule Take 1 capsule (40 mg total) by mouth 2 (two) times daily before a meal. 60 capsule 2   ondansetron (ZOFRAN) 4 MG tablet Take 1 tablet (4 mg total) by mouth every 8 (eight) hours as needed for nausea or vomiting. 10 tablet 0   PARoxetine (PAXIL) 10 MG tablet Take 1 tablet (10 mg total) by mouth daily. 90 tablet 3   tiZANidine (ZANAFLEX) 4 MG tablet Take by mouth.     triamcinolone cream (KENALOG) 0.1 % Apply topically.     trimethoprim (TRIMPEX) 100 MG tablet Take 100 mg by mouth daily. As needed     No current facility-administered medications for this visit.    OBJECTIVE: Vitals:   04/18/23 1518  BP: 109/66  Pulse: 72  Temp: 97.7 F (36.5 C)  SpO2: 100%      Body mass index is 22.09 kg/m.      General: Well-developed, well-nourished, no acute distress. Eyes: Pink conjunctiva, anicteric sclera. HEENT: Normocephalic, moist mucous membranes, clear oropharnyx. Lungs: Clear to auscultation bilaterally. Heart: Regular rate and rhythm. No rubs, murmurs, or gallops. Abdomen: Soft, nontender, nondistended. No organomegaly noted, normoactive bowel sounds. Musculoskeletal: No edema, cyanosis, or clubbing. Neuro: Alert, answering all questions appropriately. Cranial nerves grossly intact. Skin:  No rashes or petechiae noted. Psych: Normal affect. Lymphatics: No cervical, calvicular, axillary or inguinal LAD.   LAB RESULTS:  Lab Results  Component Value Date   NA 140 08/12/2022   K 3.7 08/12/2022   CL 102 08/12/2022   CO2 21 08/12/2022   GLUCOSE 89 08/12/2022   BUN 6 08/12/2022   CREATININE 0.93 08/12/2022   CALCIUM 9.5 08/12/2022   PROT 6.8 06/03/2022   ALBUMIN 4.4 06/03/2022   AST 20 06/03/2022   ALT 13 06/03/2022   ALKPHOS 48 06/03/2022   BILITOT 0.3 06/03/2022   GFRNONAA >60 09/19/2021   GFRAA >60 01/21/2018    Lab Results  Component Value Date   WBC 6.1 04/18/2023   NEUTROABS 4.1 04/18/2023   HGB 10.3 (L) 04/18/2023   HCT 31.5 (L) 04/18/2023   MCV 87.7 04/18/2023   PLT 287 04/18/2023    Lab Results  Component Value Date   TIBC 352.8 12/08/2022   TIBC 345.8 10/24/2022   FERRITIN 13.6 12/08/2022   FERRITIN 24.3 10/24/2022   FERRITIN 9.6 (L) 01/02/2019   IRONPCTSAT 14.2 (L) 12/08/2022   IRONPCTSAT 16.8 (L) 10/24/2022     STUDIES: DG Hip Unilat W OR W/O Pelvis 2-3 Views Left  Result Date: 03/28/2023 CLINICAL DATA:  Bilateral hip pain after giving birth 10 years ago. Chronic. EXAM: DG HIP (WITH OR WITHOUT PELVIS) 2-3V LEFT; DG HIP (WITH OR WITHOUT PELVIS) 2-3V RIGHT COMPARISON:  CT abdomen and pelvis 09/18/2021 FINDINGS: Mild superior pubic symphysis degenerative spurring and subchondral irregularity. The bilateral femoroacetabular joint spaces are maintained. The bilateral sacroiliac joint spaces are maintained. Normal morphology of the bilateral femoral head-neck junctions without CAM-type bump deformity. No acute fracture or dislocation. IMPRESSION: Mild pubic symphysis degenerative changes. Electronically Signed   By: Neita Garnet M.D.   On: 03/28/2023 21:29   DG HIP UNILAT WITH PELVIS 2-3 VIEWS RIGHT  Result Date: 03/28/2023 CLINICAL DATA:  Bilateral hip pain after giving birth 10 years ago. Chronic. EXAM: DG HIP (WITH OR WITHOUT PELVIS) 2-3V  LEFT; DG HIP (WITH OR WITHOUT PELVIS) 2-3V RIGHT COMPARISON:  CT abdomen and pelvis 09/18/2021 FINDINGS: Mild superior pubic symphysis degenerative spurring and subchondral irregularity. The bilateral femoroacetabular joint spaces are  maintained. The bilateral sacroiliac joint spaces are maintained. Normal morphology of the bilateral femoral head-neck junctions without CAM-type bump deformity. No acute fracture or dislocation. IMPRESSION: Mild pubic symphysis degenerative changes. Electronically Signed   By: Neita Garnet M.D.   On: 03/28/2023 21:29    ASSESSMENT AND PLAN:   Krystal Grant is a 52 y.o. female with pmh of hypertension, COVID-19, depression, fibromyalgia was referred to hematology for workup of anemia and leukopenia.  #Iron deficiency -Hemoglobin 12.2.  Ferritin 13. S/p IV Venofer 200 mg x 4 doses completed in February 2024.  Hemoglobin down to 10.8 which is surprising.  Iron level and ferritin is pending.  She denies any bleeding in urine or stool or vaginal bleeding.  I will call the patient on Thursday once the iron level is back.  If iron comes back normal, I will have her come back for more testing to evaluate for anemia.  Cannot tolerate oral iron due to constipation.  -Had colonoscopy with Dr. Allegra Lai on 04/13/2023 was normal.  Upper endoscopy showed 1 nonbleeding superficial gastric ulcer with clean ulcer base likely healing on the greater curvature of the stomach.  Pathology pending.  She was started on Prilosec 40 mg twice daily for 3 months.  Was advised repeat endoscopy in 3 months to assess for healing.  # Leukopenia, resolved -repeat CBC showed normal WBC 4.4.   -Workup was normal - B12 797, folate normal, hepatitis B/C/HIV negative.  ANA negative.  Flow cytometry was normal.  TSH 0.958  # Chronic fatigue -Noticed transient improvement in her energy level while on iron infusions.  Repeat iron level is pending.  If iron levels come back normal, it is unlikely to be  contributing to her fatigue.  She is also following with OB/GYN Dr. Valentino Saxon as fatigue may be coming from her recent menopause.  Was started on paroxetine 20 mg daily a week ago.  Has not noticed any change yet.  # Joint pain, hyperextensibility of joint -Is being worked up by primary for connective tissue disorder such as Psychiatric nurse   Orders Placed This Encounter  Procedures   CBC with Differential/Platelet   Comprehensive metabolic panel   Iron and TIBC   Ferritin   Vitamin B12   I will call the patient with the lab results RTC in 6 months for MD visit, labs  Patient expressed understanding and was in agreement with this plan. She also understands that She can call clinic at any time with any questions, concerns, or complaints.   I spent a total of 45 minutes reviewing chart data, face-to-face evaluation with the patient, counseling and coordination of care as detailed above.  Michaelyn Barter, MD   04/18/2023 3:39 PM

## 2023-04-21 DIAGNOSIS — G959 Disease of spinal cord, unspecified: Secondary | ICD-10-CM | POA: Diagnosis not present

## 2023-04-21 DIAGNOSIS — M4312 Spondylolisthesis, cervical region: Secondary | ICD-10-CM | POA: Diagnosis not present

## 2023-04-21 DIAGNOSIS — M5116 Intervertebral disc disorders with radiculopathy, lumbar region: Secondary | ICD-10-CM | POA: Diagnosis not present

## 2023-04-21 DIAGNOSIS — M5137 Other intervertebral disc degeneration, lumbosacral region: Secondary | ICD-10-CM | POA: Diagnosis not present

## 2023-04-21 DIAGNOSIS — M549 Dorsalgia, unspecified: Secondary | ICD-10-CM | POA: Diagnosis not present

## 2023-04-21 DIAGNOSIS — M5416 Radiculopathy, lumbar region: Secondary | ICD-10-CM | POA: Diagnosis not present

## 2023-04-21 NOTE — Telephone Encounter (Signed)
Can one of you please schedule a lab appointment for patient please?

## 2023-04-25 ENCOUNTER — Other Ambulatory Visit: Payer: Self-pay

## 2023-04-26 ENCOUNTER — Telehealth: Payer: Self-pay

## 2023-04-26 NOTE — Telephone Encounter (Signed)
Called and left a message for call back  

## 2023-04-26 NOTE — Telephone Encounter (Signed)
-----   Message from Toney Reil, MD sent at 04/25/2023  5:42 PM EDT ----- The pathology results from upper endoscopy came back normal.  For the ulcer, she should continue taking omeprazole 40 mg twice daily before meals and repeat upper endoscopy in 3 months to confirm healing of gastric ulcer  RV

## 2023-04-27 NOTE — Telephone Encounter (Signed)
Called and left a message for call back  

## 2023-04-27 NOTE — Telephone Encounter (Signed)
Pt returned call would like a call back.  

## 2023-04-28 ENCOUNTER — Other Ambulatory Visit: Payer: Self-pay

## 2023-04-28 ENCOUNTER — Inpatient Hospital Stay: Payer: Medicaid Other

## 2023-04-28 DIAGNOSIS — M5416 Radiculopathy, lumbar region: Secondary | ICD-10-CM | POA: Diagnosis not present

## 2023-04-28 NOTE — Telephone Encounter (Signed)
Called and left a message for call back. Sent mychart message to patient with results. Please advise if anything else needs to be done

## 2023-05-01 ENCOUNTER — Inpatient Hospital Stay: Payer: Medicaid Other | Attending: Internal Medicine

## 2023-05-01 DIAGNOSIS — D649 Anemia, unspecified: Secondary | ICD-10-CM | POA: Insufficient documentation

## 2023-05-01 DIAGNOSIS — R5383 Other fatigue: Secondary | ICD-10-CM | POA: Insufficient documentation

## 2023-05-01 LAB — CBC WITH DIFFERENTIAL/PLATELET
Abs Immature Granulocytes: 0.01 10*3/uL (ref 0.00–0.07)
Basophils Absolute: 0.1 10*3/uL (ref 0.0–0.1)
Basophils Relative: 1 %
Eosinophils Absolute: 0.1 10*3/uL (ref 0.0–0.5)
Eosinophils Relative: 3 %
HCT: 34.3 % — ABNORMAL LOW (ref 36.0–46.0)
Hemoglobin: 11.3 g/dL — ABNORMAL LOW (ref 12.0–15.0)
Immature Granulocytes: 0 %
Lymphocytes Relative: 30 %
Lymphs Abs: 1.6 10*3/uL (ref 0.7–4.0)
MCH: 28.9 pg (ref 26.0–34.0)
MCHC: 32.9 g/dL (ref 30.0–36.0)
MCV: 87.7 fL (ref 80.0–100.0)
Monocytes Absolute: 0.3 10*3/uL (ref 0.1–1.0)
Monocytes Relative: 6 %
Neutro Abs: 3.1 10*3/uL (ref 1.7–7.7)
Neutrophils Relative %: 60 %
Platelets: 335 10*3/uL (ref 150–400)
RBC: 3.91 MIL/uL (ref 3.87–5.11)
RDW: 14.1 % (ref 11.5–15.5)
WBC: 5.2 10*3/uL (ref 4.0–10.5)
nRBC: 0 % (ref 0.0–0.2)

## 2023-05-01 LAB — COMPREHENSIVE METABOLIC PANEL
ALT: 25 U/L (ref 0–44)
AST: 21 U/L (ref 15–41)
Albumin: 4.5 g/dL (ref 3.5–5.0)
Alkaline Phosphatase: 47 U/L (ref 38–126)
Anion gap: 8 (ref 5–15)
BUN: 15 mg/dL (ref 6–20)
CO2: 26 mmol/L (ref 22–32)
Calcium: 9.5 mg/dL (ref 8.9–10.3)
Chloride: 103 mmol/L (ref 98–111)
Creatinine, Ser: 1 mg/dL (ref 0.44–1.00)
GFR, Estimated: >60 mL/min (ref >60)
Glucose, Bld: 96 mg/dL (ref 70–99)
Potassium: 4.3 mmol/L (ref 3.5–5.1)
Sodium: 137 mmol/L (ref 135–145)
Total Bilirubin: 0.3 mg/dL (ref 0.3–1.2)
Total Protein: 7.5 g/dL (ref 6.5–8.1)

## 2023-05-01 LAB — IRON AND TIBC
Iron: 45 ug/dL (ref 28–170)
Saturation Ratios: 15 % (ref 10.4–31.8)
TIBC: 311 ug/dL (ref 250–450)
UIBC: 266 ug/dL

## 2023-05-01 LAB — VITAMIN B12: Vitamin B-12: 417 pg/mL (ref 180–914)

## 2023-05-01 LAB — FERRITIN: Ferritin: 54 ng/mL (ref 11–307)

## 2023-05-02 ENCOUNTER — Other Ambulatory Visit: Payer: Self-pay | Admitting: Primary Care

## 2023-05-02 DIAGNOSIS — E041 Nontoxic single thyroid nodule: Secondary | ICD-10-CM

## 2023-05-03 ENCOUNTER — Ambulatory Visit (INDEPENDENT_AMBULATORY_CARE_PROVIDER_SITE_OTHER): Payer: Medicaid Other | Admitting: Dermatology

## 2023-05-03 VITALS — BP 100/66 | HR 57

## 2023-05-03 DIAGNOSIS — D2372 Other benign neoplasm of skin of left lower limb, including hip: Secondary | ICD-10-CM

## 2023-05-03 DIAGNOSIS — I781 Nevus, non-neoplastic: Secondary | ICD-10-CM

## 2023-05-03 DIAGNOSIS — Z1283 Encounter for screening for malignant neoplasm of skin: Secondary | ICD-10-CM | POA: Diagnosis not present

## 2023-05-03 DIAGNOSIS — D239 Other benign neoplasm of skin, unspecified: Secondary | ICD-10-CM

## 2023-05-03 DIAGNOSIS — L821 Other seborrheic keratosis: Secondary | ICD-10-CM

## 2023-05-03 DIAGNOSIS — D229 Melanocytic nevi, unspecified: Secondary | ICD-10-CM

## 2023-05-03 DIAGNOSIS — D1801 Hemangioma of skin and subcutaneous tissue: Secondary | ICD-10-CM | POA: Diagnosis not present

## 2023-05-03 DIAGNOSIS — L578 Other skin changes due to chronic exposure to nonionizing radiation: Secondary | ICD-10-CM

## 2023-05-03 DIAGNOSIS — L814 Other melanin hyperpigmentation: Secondary | ICD-10-CM

## 2023-05-03 DIAGNOSIS — L280 Lichen simplex chronicus: Secondary | ICD-10-CM | POA: Diagnosis not present

## 2023-05-03 DIAGNOSIS — W908XXA Exposure to other nonionizing radiation, initial encounter: Secondary | ICD-10-CM | POA: Diagnosis not present

## 2023-05-03 DIAGNOSIS — D2371 Other benign neoplasm of skin of right lower limb, including hip: Secondary | ICD-10-CM | POA: Diagnosis not present

## 2023-05-03 DIAGNOSIS — Z808 Family history of malignant neoplasm of other organs or systems: Secondary | ICD-10-CM

## 2023-05-03 DIAGNOSIS — Z7189 Other specified counseling: Secondary | ICD-10-CM | POA: Diagnosis not present

## 2023-05-03 DIAGNOSIS — X32XXXA Exposure to sunlight, initial encounter: Secondary | ICD-10-CM

## 2023-05-03 MED ORDER — MOMETASONE FUROATE 0.1 % EX CREA
TOPICAL_CREAM | CUTANEOUS | 1 refills | Status: DC
Start: 2023-05-03 — End: 2024-05-01

## 2023-05-03 NOTE — Patient Instructions (Addendum)
A dermatofibroma is a benign growth possibly related to trauma, such as an insect bite, cut from shaving, or inflamed acne-type bump.  Treatment options to remove include shave or excision with resulting scar and risk of recurrence.  Since not bothersome, will observe for now.   Counseling for BBL / IPL / Laser and Coordination of Care Discussed the treatment option of Broad Band Light (BBL) /Intense Pulsed Light (IPL)/ Laser for skin discoloration, including brown spots and redness.  Typically we recommend at least 1-3 treatment sessions about 5-8 weeks apart for best results.  Cannot have tanned skin when BBL performed, and regular use of sunscreen is advised after the procedure to help maintain results. The patient's condition may also require "maintenance treatments" in the future.  The fee for BBL / laser treatments is $350 per treatment session for the whole face.  A fee can be quoted for other parts of the body.  Insurance typically does not pay for BBL/laser treatments and therefore the fee is an out-of-pocket cost.     For itchy feet  Start mometasone cream - apply to itchy areas twice daily as needed. Can use for up to 4 weeks. Only use as needed. If clear do not use.  Topical steroids (such as triamcinolone, fluocinolone, fluocinonide, mometasone, clobetasol, halobetasol, betamethasone, hydrocortisone) can cause thinning and lightening of the skin if they are used for too long in the same area. Your physician has selected the right strength medicine for your problem and area affected on the body. Please use your medication only as directed by your physician to prevent side effects.    Gentle Skin Care Guide  1. Bathe no more than once a day.  2. Avoid bathing in hot water  3. Use a mild soap like Dove, Vanicream, Cetaphil, CeraVe. Can use Lever 2000 or Cetaphil antibacterial soap  4. Use soap only where you need it. On most days, use it under your arms, between your legs, and on  your feet. Let the water rinse other areas unless visibly dirty.  5. When you get out of the bath/shower, use a towel to gently blot your skin dry, don't rub it.  6. While your skin is still a little damp, apply a moisturizing cream such as Vanicream, CeraVe, Cetaphil, Eucerin, Sarna lotion or plain Vaseline Jelly. For hands apply Neutrogena [place] Hand Cream or Excipial Hand Cream.  7. Reapply moisturizer any time you start to itch or feel dry.  8. Sometimes using free and clear laundry detergents can be helpful. Fabric softener sheets should be avoided. Downy Free & Gentle liquid, or any liquid fabric softener that is free of dyes and perfumes, it acceptable to use  9. If your doctor has given you prescription creams you may apply moisturizers over them      Melanoma ABCDEs  Melanoma is the most dangerous type of skin cancer, and is the leading cause of death from skin disease.  You are more likely to develop melanoma if you: Have light-colored skin, light-colored eyes, or red or blond hair Spend a lot of time in the sun Tan regularly, either outdoors or in a tanning bed Have had blistering sunburns, especially during childhood Have a close family member who has had a melanoma Have atypical moles or large birthmarks  Early detection of melanoma is key since treatment is typically straightforward and cure rates are extremely high if we catch it early.   The first sign of melanoma is often a change in a  mole or a new dark spot.  The ABCDE system is a way of remembering the signs of melanoma.  A for asymmetry:  The two halves do not match. B for border:  The edges of the growth are irregular. C for color:  A mixture of colors are present instead of an even brown color. D for diameter:  Melanomas are usually (but not always) greater than 6mm - the size of a pencil eraser. E for evolution:  The spot keeps changing in size, shape, and color.  Please check your skin once per month  between visits. You can use a small mirror in front and a large mirror behind you to keep an eye on the back side or your body.   If you see any new or changing lesions before your next follow-up, please call to schedule a visit.  Please continue daily skin protection including broad spectrum sunscreen SPF 30+ to sun-exposed areas, reapplying every 2 hours as needed when you're outdoors.   Staying in the shade or wearing long sleeves, sun glasses (UVA+UVB protection) and wide brim hats (4-inch brim around the entire circumference of the hat) are also recommended for sun protection.    Due to recent changes in healthcare laws, you may see results of your pathology and/or laboratory studies on MyChart before the doctors have had a chance to review them. We understand that in some cases there may be results that are confusing or concerning to you. Please understand that not all results are received at the same time and often the doctors may need to interpret multiple results in order to provide you with the best plan of care or course of treatment. Therefore, we ask that you please give [place] 2 business days to thoroughly review all your results before contacting the office for clarification. Should we see a critical lab result, you will be contacted sooner.   If You Need Anything After Your Visit  If you have any questions or concerns for your doctor, please call our main line at [phone] and press option 4 to reach your doctor's medical assistant. If no one answers, please leave a voicemail as directed and we will return your call as soon as possible. Messages left after 4 pm will be answered the following business day.   You may also send [place] a message via MyChart. We typically respond to MyChart messages within 1-2 business days.  For prescription refills, please ask your pharmacy to contact our office. Our fax number is [phone].  If you have an urgent issue when the clinic is closed that  cannot wait until the next business day, you can page your doctor at the number below.    Please note that while we do our best to be available for urgent issues outside of office hours, we are not available 24/7.   If you have an urgent issue and are unable to reach [place], you may choose to seek medical care at your doctor's office, retail clinic, urgent care center, or emergency room.  If you have a medical emergency, please immediately call 911 or go to the emergency department.  Pager Numbers  - Dr. Gwen Pounds: [phone]  - Dr. Neale Burly: [phone]  - Dr. Roseanne Reno: [phone]  In the event of inclement weather, please call our main line at [phone] for an update on the status of any delays or closures.  Dermatology Medication Tips: Please keep the boxes that topical medications come in in order to help keep track of  the instructions about where and how to use these. Pharmacies typically print the medication instructions only on the boxes and not directly on the medication tubes.   If your medication is too expensive, please contact our office at [phone] option 4 or send [place] a message through MyChart.   We are unable to tell what your co-pay for medications will be in advance as this is different depending on your insurance coverage. However, we may be able to find a substitute medication at lower cost or fill out paperwork to get insurance to cover a needed medication.   If a prior authorization is required to get your medication covered by your insurance company, please allow [place] 1-2 business days to complete this process.  Drug prices often vary depending on where the prescription is filled and some pharmacies may offer cheaper prices.  The website www.goodrx.com contains coupons for medications through different pharmacies. The prices here do not account for what the cost may be with help from insurance (it may be cheaper with your insurance), but the website can give you the  price if you did not use any insurance.  - You can print the associated coupon and take it with your prescription to the pharmacy.  - You may also stop by our office during regular business hours and pick up a GoodRx coupon card.  - If you need your prescription sent electronically to a different pharmacy, notify our office through [facility] or by phone at [phone] option 4.     Si Usted Necesita Algo Despus de Su Visita  Tambin puede enviarnos un mensaje a travs de Clinical cytogeneticist. Por lo general respondemos a los mensajes de MyChart en el transcurso de 1 a 2 das hbiles.  Para renovar recetas, por favor pida a su farmacia que se ponga en contacto con nuestra oficina. Annie Sable de fax es [place] [phone].  Si tiene un asunto urgente cuando la clnica est cerrada y que no puede esperar hasta el siguiente da hbil, puede llamar/localizar a su doctor(a) al nmero que aparece a continuacin.   Por favor, tenga en cuenta que aunque hacemos todo lo posible para estar disponibles para asuntos urgentes fuera del horario de [place], no estamos disponibles las 24 horas del da, los 7 [address]  [address] de la [place].   Si tiene un problema urgente y no puede comunicarse con nosotros, puede optar por buscar atencin mdica  en el consultorio de su doctor(a), en una clnica privada, en un centro de atencin urgente o en una sala de emergencias.  Si tiene Engineer, drilling, por favor llame inmediatamente al 911 o vaya a la sala de emergencias.  Nmeros de bper  - Dr. Gwen Pounds: [phone]  - Dra. Moye: [phone]  - Dra. Roseanne Reno: [phone]  En caso de inclemencias del [place], por favor llame a Lacy Duverney principal al [phone] para una actualizacin sobre el [place] de cualquier retraso o cierre.  Consejos para la medicacin en dermatologa: Por favor, guarde las cajas en las que vienen los medicamentos de uso tpico para ayudarle a seguir las instrucciones sobre dnde y cmo  usarlos. Las farmacias generalmente imprimen las instrucciones del medicamento slo en las cajas y no directamente en los tubos del [place].   Si su medicamento es muy caro, por favor, pngase en contacto con Rolm Gala llamando al [phone] y presione la opcin 4 o envenos un mensaje a travs de Clinical cytogeneticist.   No podemos decirle cul ser su copago por los medicamentos por adelantado  ya que esto es diferente dependiendo de la cobertura de su seguro. Sin embargo, es posible que podamos encontrar un medicamento sustituto a Audiological scientist un formulario para que el seguro cubra el medicamento que se considera necesario.   Si se requiere una autorizacin previa para que su compaa de seguros [place] su medicamento, por favor permtanos de 1 a 2 das hbiles para completar [address]  Los precios de los medicamentos varan con frecuencia dependiendo del Environmental consultant de dnde se surte la receta y alguna farmacias pueden ofrecer precios ms baratos.  El sitio web www.goodrx.com tiene cupones para medicamentos de Health and safety inspector. Los precios aqu no tienen en cuenta lo que podra costar con la ayuda del seguro (puede ser ms barato con su seguro), pero el sitio web puede darle el precio si no utiliz Tourist information centre manager.  - Puede imprimir el cupn correspondiente y llevarlo con su receta a la farmacia.  - Tambin puede pasar por nuestra oficina durante el horario de atencin regular y Education officer, museum una tarjeta de cupones de GoodRx.  - Si necesita que su receta se enve electrnicamente a una farmacia diferente, informe a nuestra oficina a travs de MyChart de [place] o por telfono llamando al [phone] y presione la opcin 4.

## 2023-05-03 NOTE — Progress Notes (Signed)
New Patient Visit   Subjective  Krystal Grant is a 52 y.o. female who presents for the following: Skin Cancer Screening and Full Body Skin Exam Reports h/o aks, has a spot at right temple she would like checked  The patient presents for Total-Body Skin Exam (TBSE) for skin cancer screening and mole check. The patient has spots, moles and lesions to be evaluated, some may be new or changing and the patient has concerns that these could be cancer.    The following portions of the chart were reviewed this encounter and updated as appropriate: medications, allergies, medical history  Review of Systems:  No other skin or systemic complaints except as noted in HPI or Assessment and Plan.  Objective  Well appearing patient in no apparent distress; mood and affect are within normal limits.  A full examination was performed including scalp, head, eyes, ears, nose, lips, neck, chest, axillae, abdomen, back, buttocks, bilateral upper extremities, bilateral lower extremities, hands, feet, fingers, toes, fingernails, and toenails. All findings within normal limits unless otherwise noted below.   Relevant physical exam findings are noted in the Assessment and Plan.    Assessment & Plan   LENTIGINES, SEBORRHEIC KERATOSES, HEMANGIOMAS - Benign normal skin lesions - Benign-appearing - Call for any changes Lentigo  Exam: 4 mm speckled light tan macule at right lateral canthus Benign-appearing.  Observation.  Call clinic for new or changing lesions.  Recommend daily use of broad spectrum spf 30+ sunscreen to sun-exposed areas.    MELANOCYTIC NEVI - Tan-brown and/or pink-flesh-colored symmetric macules and papules, including flesh papule at right lower lip - Benign appearing on exam today - Observation - Call clinic for new or changing moles - Recommend daily use of broad spectrum spf 30+ sunscreen to sun-exposed areas.   DERMATOFIBROMA Right anterior thigh, right knee, left medial posterior  thigh, and left calf   Exam: Firm pink/brown papulenodule with dimple sign. Treatment Plan: A dermatofibroma is a benign growth possibly related to trauma, such as an insect bite, cut from shaving, or inflamed acne-type bump.  Treatment options to remove include shave or excision with resulting scar and risk of recurrence.  Since benign-appearing and not bothersome, will observe for now.   TELANGIECTASIA Exam: blanching pink macule- Right lower cheek   Treatment Plan: Benign appearing on exam Discussed BBL laser treatment.  Call for changes  Counseling for BBL / IPL / Laser and Coordination of Care Discussed the treatment option of Broad Band Light (BBL) /Intense Pulsed Light (IPL)/ Laser for skin discoloration, including brown spots and redness.  Typically we recommend at least 1-3 treatment sessions about 5-8 weeks apart for best results.  Cannot have tanned skin when BBL performed, and regular use of sunscreen is advised after the procedure to help maintain results. The patient's condition may also require "maintenance treatments" in the future.  The fee for BBL / laser treatments is $350 per treatment session for the whole face.  A fee can be quoted for other parts of the body.  Insurance typically does not pay for BBL/laser treatments and therefore the fee is an out-of-pocket cost.   Lichen Simplex Chronicus  Exam: mild lichenification on b/l dorsal feet, pt itch per pt  Chronic and persistent condition with duration or expected duration over one year. Condition is bothersome/symptomatic for patient. Currently flared.   Treatment Plan: Avoid itching and scratching Start mometasone 1 % cream - apply topically bid to affected itchy areas prn. Can use up to 4 weeks.  Use only when needed for itch.   ACTINIC DAMAGE - Chronic condition, secondary to cumulative UV/sun exposure - diffuse scaly erythematous macules with underlying dyspigmentation - Recommend daily broad spectrum sunscreen  SPF 30+ to sun-exposed areas, reapply every 2 hours as needed.  - Staying in the shade or wearing long sleeves, sun glasses (UVA+UVB protection) and wide brim hats (4-inch brim around the entire circumference of the hat) are also recommended for sun protection.  - Call for new or changing lesions.  FAMILY HISTORY OF SKIN CANCER What type(s):bcc  Who affected:mother   SKIN CANCER SCREENING PERFORMED TODAY.   Return for once every 3 year tbse.  I, Asher Muir, CMA, am acting as scribe for Willeen Niece, MD.   Documentation: I have reviewed the above documentation for accuracy and completeness, and I agree with the above.  Willeen Niece, MD

## 2023-05-08 ENCOUNTER — Encounter: Payer: Self-pay | Admitting: Internal Medicine

## 2023-05-15 NOTE — Telephone Encounter (Signed)
NA

## 2023-05-16 ENCOUNTER — Encounter: Payer: Self-pay | Admitting: *Deleted

## 2023-05-16 ENCOUNTER — Ambulatory Visit (INDEPENDENT_AMBULATORY_CARE_PROVIDER_SITE_OTHER): Payer: Medicaid Other | Admitting: Obstetrics and Gynecology

## 2023-05-16 ENCOUNTER — Encounter: Payer: Self-pay | Admitting: Obstetrics and Gynecology

## 2023-05-16 VITALS — BP 108/82 | HR 67 | Wt 125.8 lb

## 2023-05-16 DIAGNOSIS — N951 Menopausal and female climacteric states: Secondary | ICD-10-CM | POA: Diagnosis not present

## 2023-05-16 DIAGNOSIS — N820 Vesicovaginal fistula: Secondary | ICD-10-CM

## 2023-05-16 DIAGNOSIS — N8110 Cystocele, unspecified: Secondary | ICD-10-CM | POA: Diagnosis not present

## 2023-05-16 DIAGNOSIS — N393 Stress incontinence (female) (male): Secondary | ICD-10-CM | POA: Diagnosis not present

## 2023-05-16 DIAGNOSIS — Z4689 Encounter for fitting and adjustment of other specified devices: Secondary | ICD-10-CM | POA: Diagnosis not present

## 2023-05-16 NOTE — Progress Notes (Signed)
    GYNECOLOGY PROGRESS NOTE  Subjective:    Patient ID: Krystal Grant, female    DOB: 05/21/1971, 52 y.o.   MRN: [id number]  HPI  Patient is a 52 y.o. female who presents for pessary fitting.  She has a history of small vesicovaginal fistula, stress incontinence, and some intermittent pelvic pressure.   Additionally is following up with perimenopausal symptoms. Was prescribed Paxil last visit, however after reading about the medication, had some concerns of how it would interact with her other mood medications.  Did not initiate.   The following portions of the patient's history were reviewed and updated as appropriate: allergies, current medications, past family history, past medical history, past social history, past surgical history, and problem list.  Review of Systems Pertinent items noted in HPI and remainder of comprehensive ROS otherwise negative.   Objective:   Blood pressure 108/82, pulse 67, weight 125 lb 12.8 oz (57.1 kg). Body mass index is 22.28 kg/m. General appearance: alert and no distress Abdomen: soft, non-tender; bowel sounds normal; no masses,  no organomegaly Pelvic: external genitalia normal, rectovaginal septum normal.  Vagina without discharge.  Grade 1-2 cystocele present. Cervix normal appearing, no lesions Bimanual exam deferred.  non-palpable, nontender bilaterally.    Assessment:   1. Encounter for fitting and adjustment of pessary   2. Female cystocele   3. Vesicovaginal fistula   4. Perimenopausal symptoms   5. Stress incontinence      Plan:   Pessary fitting performed today.  Fitted for Size 3 ring with support and knob.  Will return in 2-3 weeks for pessary insertion.  Revisited discussion of perimenopausal symptoms. Patient would like to avoid hormones if possible.  Discussed other options including Clonidine, Effexor, or newer medications such as Veozah or Estrovera.  Patient would like information on these types of medications.  Given  handouts.     Hildred Laser, MD [facility]

## 2023-05-17 DIAGNOSIS — M5416 Radiculopathy, lumbar region: Secondary | ICD-10-CM | POA: Diagnosis not present

## 2023-05-26 ENCOUNTER — Ambulatory Visit
Admission: RE | Admit: 2023-05-26 | Discharge: 2023-05-26 | Disposition: A | Discharge status: Home / Self Care | Payer: Medicaid Other | Source: Ambulatory Visit | Attending: Primary Care | Admitting: Primary Care

## 2023-05-26 DIAGNOSIS — E042 Nontoxic multinodular goiter: Secondary | ICD-10-CM | POA: Diagnosis not present

## 2023-05-26 DIAGNOSIS — E041 Nontoxic single thyroid nodule: Secondary | ICD-10-CM | POA: Insufficient documentation

## 2023-05-31 ENCOUNTER — Other Ambulatory Visit: Payer: Self-pay | Admitting: Primary Care

## 2023-05-31 DIAGNOSIS — I10 Essential (primary) hypertension: Secondary | ICD-10-CM

## 2023-06-06 NOTE — Telephone Encounter (Signed)
NA

## 2023-06-14 DIAGNOSIS — M5416 Radiculopathy, lumbar region: Secondary | ICD-10-CM | POA: Diagnosis not present

## 2023-06-14 DIAGNOSIS — M25552 Pain in left hip: Secondary | ICD-10-CM | POA: Diagnosis not present

## 2023-06-14 DIAGNOSIS — M25551 Pain in right hip: Secondary | ICD-10-CM | POA: Diagnosis not present

## 2023-06-16 DIAGNOSIS — M25551 Pain in right hip: Secondary | ICD-10-CM | POA: Diagnosis not present

## 2023-06-16 DIAGNOSIS — M25552 Pain in left hip: Secondary | ICD-10-CM | POA: Diagnosis not present

## 2023-06-19 ENCOUNTER — Other Ambulatory Visit: Payer: Self-pay | Admitting: Primary Care

## 2023-06-19 DIAGNOSIS — F411 Generalized anxiety disorder: Secondary | ICD-10-CM

## 2023-06-19 NOTE — Telephone Encounter (Signed)
Please call patient:  During our last visit we increased her buspirone to 20 mg three times daily. Has that been helping?  Received refill request for buspirone and want to make sure we give her the correct amount.

## 2023-06-20 ENCOUNTER — Telehealth: Payer: Self-pay

## 2023-06-20 DIAGNOSIS — F411 Generalized anxiety disorder: Secondary | ICD-10-CM

## 2023-06-20 MED ORDER — FLUOXETINE HCL 10 MG PO CAPS
10.0000 mg | ORAL_CAPSULE | Freq: Every day | ORAL | 0 refills | Status: DC
Start: 2023-06-20 — End: 2023-08-16

## 2023-06-20 NOTE — Telephone Encounter (Signed)
Unable to reach patient. Left voicemail to return call to our office.   

## 2023-06-20 NOTE — Telephone Encounter (Signed)
Called and left a message to schedule a repeat EGD for patient. Sent mychart message also

## 2023-06-21 NOTE — Telephone Encounter (Signed)
Noted. New Rx sent to pharmacy. 

## 2023-06-21 NOTE — Telephone Encounter (Signed)
Called and left called and left a message for call back

## 2023-06-21 NOTE — Telephone Encounter (Signed)
Called and spoke to patient, she states the increase in medication of the buspirone to 20mg  three times daily is going well and she feels good on it. She would like to stay on this dosage for now.

## 2023-06-22 NOTE — Telephone Encounter (Signed)
Called and left a message for call back  

## 2023-06-22 NOTE — Telephone Encounter (Signed)
Mailed Letter to patient.

## 2023-06-22 NOTE — Telephone Encounter (Signed)
Unable to reach patient to schedule repeat EGD Called 3 times and sent mychart message

## 2023-06-27 ENCOUNTER — Ambulatory Visit: Payer: Medicaid Other | Admitting: Obstetrics and Gynecology

## 2023-06-27 ENCOUNTER — Ambulatory Visit: Payer: Medicaid Other | Admitting: Dermatology

## 2023-06-27 NOTE — Progress Notes (Deleted)
    GYNECOLOGY PROGRESS NOTE  Subjective:    Patient ID: Krystal Grant, female    DOB: 05-Feb-1971, 52 y.o.   MRN: [id number]  HPI  Patient is a 52 y.o. No obstetric history on file. female who presents for Pessary Maintenance. She has a history of small vesicovaginal fistula, stress incontinence, and some intermittent pelvic pressure. She also has a Grade 1-2 cystocele. She has been fitted for Size 3 ring with support and knob.   {Common ambulatory SmartLinks:19316}  Review of Systems {ros; complete:30496}   Objective:   There were no vitals taken for this visit. There is no height or weight on file to calculate BMI. General appearance: {general exam:16600} Abdomen: {abdominal exam:16834} Pelvic: {pelvic exam:16852::"cervix normal in appearance","external genitalia normal","no adnexal masses or tenderness","no cervical motion tenderness","rectovaginal septum normal","uterus normal size, shape, and consistency","vagina normal without discharge"} Extremities: {extremity exam:5109} Neurologic: {neuro exam:17854}   Assessment:   No diagnosis found.   Plan:   There are no diagnoses linked to this encounter.    Hildred Laser, MD [facility]

## 2023-07-25 DIAGNOSIS — H43813 Vitreous degeneration, bilateral: Secondary | ICD-10-CM | POA: Diagnosis not present

## 2023-07-25 DIAGNOSIS — H5319 Other subjective visual disturbances: Secondary | ICD-10-CM | POA: Diagnosis not present

## 2023-07-25 DIAGNOSIS — Q796 Ehlers-Danlos syndrome, unspecified: Secondary | ICD-10-CM | POA: Diagnosis not present

## 2023-07-26 DIAGNOSIS — M25851 Other specified joint disorders, right hip: Secondary | ICD-10-CM | POA: Diagnosis not present

## 2023-07-26 DIAGNOSIS — M25852 Other specified joint disorders, left hip: Secondary | ICD-10-CM | POA: Diagnosis not present

## 2023-07-26 DIAGNOSIS — M67853 Other specified disorders of tendon, right hip: Secondary | ICD-10-CM | POA: Diagnosis not present

## 2023-07-26 DIAGNOSIS — M25551 Pain in right hip: Secondary | ICD-10-CM | POA: Diagnosis not present

## 2023-07-26 DIAGNOSIS — M25552 Pain in left hip: Secondary | ICD-10-CM | POA: Diagnosis not present

## 2023-07-26 DIAGNOSIS — M67854 Other specified disorders of tendon, left hip: Secondary | ICD-10-CM | POA: Diagnosis not present

## 2023-07-26 DIAGNOSIS — M7602 Gluteal tendinitis, left hip: Secondary | ICD-10-CM | POA: Diagnosis not present

## 2023-07-26 DIAGNOSIS — M7601 Gluteal tendinitis, right hip: Secondary | ICD-10-CM | POA: Diagnosis not present

## 2023-07-26 DIAGNOSIS — S73191A Other sprain of right hip, initial encounter: Secondary | ICD-10-CM | POA: Diagnosis not present

## 2023-07-26 DIAGNOSIS — Q796 Ehlers-Danlos syndrome, unspecified: Secondary | ICD-10-CM | POA: Diagnosis not present

## 2023-07-26 DIAGNOSIS — S73192A Other sprain of left hip, initial encounter: Secondary | ICD-10-CM | POA: Diagnosis not present

## 2023-07-28 DIAGNOSIS — M25551 Pain in right hip: Secondary | ICD-10-CM | POA: Diagnosis not present

## 2023-07-28 DIAGNOSIS — M545 Low back pain, unspecified: Secondary | ICD-10-CM | POA: Diagnosis not present

## 2023-07-28 DIAGNOSIS — M25552 Pain in left hip: Secondary | ICD-10-CM | POA: Diagnosis not present

## 2023-08-01 ENCOUNTER — Emergency Department: Payer: Medicaid Other

## 2023-08-01 ENCOUNTER — Telehealth: Payer: Self-pay | Admitting: Primary Care

## 2023-08-01 ENCOUNTER — Encounter: Payer: Self-pay | Admitting: Emergency Medicine

## 2023-08-01 ENCOUNTER — Emergency Department
Admission: EM | Admit: 2023-08-01 | Discharge: 2023-08-02 | Disposition: A | Discharge status: Other Acute Inpatient Hospital | Payer: Medicaid Other | Attending: Emergency Medicine | Admitting: Emergency Medicine

## 2023-08-01 ENCOUNTER — Other Ambulatory Visit: Payer: Self-pay

## 2023-08-01 DIAGNOSIS — F419 Anxiety disorder, unspecified: Secondary | ICD-10-CM | POA: Diagnosis present

## 2023-08-01 DIAGNOSIS — R4182 Altered mental status, unspecified: Secondary | ICD-10-CM | POA: Insufficient documentation

## 2023-08-01 DIAGNOSIS — F309 Manic episode, unspecified: Secondary | ICD-10-CM | POA: Insufficient documentation

## 2023-08-01 DIAGNOSIS — Z20822 Contact with and (suspected) exposure to covid-19: Secondary | ICD-10-CM | POA: Diagnosis not present

## 2023-08-01 DIAGNOSIS — F29 Unspecified psychosis not due to a substance or known physiological condition: Secondary | ICD-10-CM | POA: Diagnosis not present

## 2023-08-01 DIAGNOSIS — F12950 Cannabis use, unspecified with psychotic disorder with delusions: Secondary | ICD-10-CM | POA: Diagnosis present

## 2023-08-01 LAB — COMPREHENSIVE METABOLIC PANEL
ALT: 21 U/L (ref 0–44)
AST: 33 U/L (ref 15–41)
Albumin: 5 g/dL (ref 3.5–5.0)
Alkaline Phosphatase: 40 U/L (ref 38–126)
Anion gap: 11 (ref 5–15)
BUN: 9 mg/dL (ref 6–20)
CO2: 25 mmol/L (ref 22–32)
Calcium: 10 mg/dL (ref 8.9–10.3)
Chloride: 100 mmol/L (ref 98–111)
Creatinine, Ser: 0.83 mg/dL (ref 0.44–1.00)
GFR, Estimated: >60 mL/min (ref >60)
Glucose, Bld: 107 mg/dL — ABNORMAL HIGH (ref 70–99)
Potassium: 4.2 mmol/L (ref 3.5–5.1)
Sodium: 136 mmol/L (ref 135–145)
Total Bilirubin: 1.1 mg/dL (ref 0.3–1.2)
Total Protein: 7.9 g/dL (ref 6.5–8.1)

## 2023-08-01 LAB — CBC
HCT: 32.9 % — ABNORMAL LOW (ref 36.0–46.0)
Hemoglobin: 10.9 g/dL — ABNORMAL LOW (ref 12.0–15.0)
MCH: 28.9 pg (ref 26.0–34.0)
MCHC: 33.1 g/dL (ref 30.0–36.0)
MCV: 87.3 fL (ref 80.0–100.0)
Platelets: 377 10*3/uL (ref 150–400)
RBC: 3.77 MIL/uL — ABNORMAL LOW (ref 3.87–5.11)
RDW: 13.7 % (ref 11.5–15.5)
WBC: 8.9 10*3/uL (ref 4.0–10.5)
nRBC: 0 % (ref 0.0–0.2)

## 2023-08-01 LAB — SALICYLATE LEVEL: Salicylate Lvl: <7 mg/dL — ABNORMAL LOW (ref 7.0–30.0)

## 2023-08-01 LAB — URINE DRUG SCREEN, QUALITATIVE (ARMC ONLY)
Amphetamines, Ur Screen: NOT DETECTED
Barbiturates, Ur Screen: NOT DETECTED
Benzodiazepine, Ur Scrn: NOT DETECTED
Cannabinoid 50 Ng, Ur ~~LOC~~: POSITIVE — AB
Cocaine Metabolite,Ur ~~LOC~~: NOT DETECTED
MDMA (Ecstasy)Ur Screen: NOT DETECTED
Methadone Scn, Ur: NOT DETECTED
Opiate, Ur Screen: NOT DETECTED
Phencyclidine (PCP) Ur S: NOT DETECTED
Tricyclic, Ur Screen: NOT DETECTED

## 2023-08-01 LAB — ACETAMINOPHEN LEVEL: Acetaminophen (Tylenol), Serum: <10 ug/mL — ABNORMAL LOW (ref 10–30)

## 2023-08-01 LAB — ETHANOL: Alcohol, Ethyl (B): <10 mg/dL (ref <10)

## 2023-08-01 LAB — POC URINE PREG, ED: Preg Test, Ur: NEGATIVE

## 2023-08-01 LAB — LITHIUM LEVEL: Lithium Lvl: <0.06 mmol/L — ABNORMAL LOW (ref 0.60–1.20)

## 2023-08-01 LAB — T4, FREE: Free T4: 1.12 ng/dL (ref 0.61–1.12)

## 2023-08-01 LAB — TSH: TSH: 1.57 u[IU]/mL (ref 0.350–4.500)

## 2023-08-01 MED ORDER — HALOPERIDOL 5 MG PO TABS
5.0000 mg | ORAL_TABLET | Freq: Once | ORAL | Status: AC
  Administered 2023-08-01: 5 mg via ORAL
  Filled 2023-08-01: qty 1

## 2023-08-01 MED ORDER — DIAZEPAM 5 MG PO TABS
5.0000 mg | ORAL_TABLET | Freq: Once | ORAL | Status: AC
  Administered 2023-08-01: 5 mg via ORAL
  Filled 2023-08-01: qty 1

## 2023-08-01 MED ORDER — OLANZAPINE 10 MG IM SOLR
10.0000 mg | Freq: Once | INTRAMUSCULAR | Status: AC
  Administered 2023-08-01: 10 mg via INTRAMUSCULAR
  Filled 2023-08-01: qty 10

## 2023-08-01 MED ORDER — GABAPENTIN 300 MG PO CAPS
300.0000 mg | ORAL_CAPSULE | Freq: Three times a day (TID) | ORAL | Status: DC
  Administered 2023-08-01: 300 mg via ORAL
  Filled 2023-08-01: qty 1

## 2023-08-01 NOTE — ED Notes (Signed)
[facility] PENDING  CONSULT ?
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
## 2023-08-01 NOTE — Telephone Encounter (Signed)
Received MyChart message from patient's son who reported that she is having a tough time since the loss of a family member.  Can we please call to check on her/triage her?  Her son seem concerned about potential mania.  If she is experiencing uncontrollable symptoms then she needs ED evaluation or urgent care behavioral health evaluation.  I am also happy to see her if appropriate.

## 2023-08-01 NOTE — Consult Note (Addendum)
[facility] Face-to-Face Psychiatry Consult   Reason for Consult:  psychosis Referring Physician:  EDP Patient Identification: Krystal Grant MRN:  [id number] Principal Diagnosis: Mania [facility]) Diagnosis:  Principal Problem:   Mania (HCC)   Total Time spent with patient: 45 minutes  Subjective:   Krystal Grant is a 52 y.o. female patient admitted with mania and psychosis.  HPI:  52 yo female presented to the ED via her husband for mania and unusual behaviors.  Prior to the assessment, she was sitting on a stretcher in the hallway with her nose on the wall.  She was moved to a room when she was frequently accusing the nursing staff of taking her [employer] and yelling intermittently while having visual hallucinations of seeing rainbows.  The majority of the time, she had her head under her covers.  When the team tried to talk to her in her room, she continued to keep the bed covers over her face.  She stated she felt "vulnerable" and would not expand when talking to this female provider and female therapist.  Denies depression with anxiety as "my instinct in a constant state of panic"  She did report taking lithium, marijuana, chamomile, vanilla, and coffee. Denies having a bipolar diagnosis.  When asked about sleep, answered "that's how I lead the rainbow around the mood."  Then she started talking negatively about a "Jeannett Senior and [place] who are f'ing with her" and became upset.  The team left as our presence seemed to escalate her symptoms.   Her husband was contacted for collateral information.  He stated that she has not been herself since returning from [place] where she visited her mother which was very stressful.  She started having a change in her beliefs, politically and religiously.  "I just want my wife back".  They have been married for 15 years with no similar occurrence/s.  When she returned from [place], she started using marijuana that increased to the point of waxing/dabbing.  This is when her symptoms  increased significantly, most likely as this form of cannabis is 100% or more potent than normal smoking of cannabis.  Her son recently experienced a similar episode of "mania" per her husband after using cannabis.  He told  the team that she is using 5 mg of Lithium oxalate 5 mg that she gets through [place] since her return from [place].    Psych admission required for stabilization.2            Past Psychiatric History: depression, anxiety  Risk to Self:  yes Risk to Others:  none Prior Inpatient Therapy:   Prior Outpatient Therapy:    Past Medical History:  Past Medical History:  Diagnosis Date  . Acute appendicitis 09/18/2021  . Acute non-recurrent sinusitis 06/04/2020  . Anxiety   . Arthritis   . Bilateral lower extremity edema 06/03/2022  . Carpal tunnel syndrome on right 03/15/2017  . Common migraine with intractable migraine 01/25/2018  . Complication of anesthesia   . 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 COVID-19 virus infection 03/24/2022  . Depression   . E. coli UTI (urinary tract infection) 07/04/2018  . History of chicken pox   . History of shingles 2008  . Hypertension   . Leukopenia 12/19/2022  . Mixed incontinence 08/27/2018  . Seronegative rheumatoid arthritis (HCC)   . 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 CARPAL TUNNEL RELEASE  06/28/2017  . COLONOSCOPY WITH PROPOFOL N/A 04/13/2023   Procedure: COLONOSCOPY  WITH PROPOFOL;  Surgeon: Toney Reil, MD;  Location: [facility];  Service: Endoscopy;  Laterality: N/A;  . ELBOW ARTHROSCOPY  06/28/2017  . ESOPHAGOGASTRODUODENOSCOPY (EGD) WITH PROPOFOL N/A 04/13/2023   Procedure: ESOPHAGOGASTRODUODENOSCOPY (EGD) WITH PROPOFOL;  Surgeon: Toney Reil, MD;  Location: [facility];  Service: Endoscopy;  Laterality: N/A;  . XI ROBOTIC LAPAROSCOPIC ASSISTED APPENDECTOMY N/A 09/18/2021   Procedure: XI ROBOTIC LAPAROSCOPIC ASSISTED APPENDECTOMY;  Surgeon:  Sung Amabile, DO;  Location: [facility];  Service: General;  Laterality: N/A;   Family History:  Family History  Problem Relation Age of Onset  . Migraines Mother   . Hypertension Mother   . Heart attack Mother 87  . Nephrolithiasis Mother   . Alcohol abuse Sister   . Depression Sister   . Arthritis Maternal Grandmother   . Hearing loss Maternal Grandmother   . Heart attack Maternal Grandfather   . Cancer Paternal Grandmother        Breast  . Hearing loss Paternal Grandmother   . Heart attack Paternal Grandmother   . Breast cancer Paternal Grandmother   . Heart attack Paternal Grandfather   . Asthma Son   . Depression Son   . Mental illness Son   . Learning disabilities Son   . Uterine cancer Maternal Aunt    Family Psychiatric  History: see above Social History:  Social History   Substance and Sexual Activity  Alcohol Use No     Social History   Substance and Sexual Activity  Drug Use No    Social History   Socioeconomic History  . Marital status: Married    Spouse name: Not on file  . Number of children: 3  . Years of education: 20  . Highest education level: Not on file  Occupational History  . Occupation: [employer]   Tobacco Use  . Smoking status: Former    Current packs/day: 0.00    Average packs/day: 1 pack/day for 20.0 years (20.0 ttl pk-yrs)    Types: Cigarettes    Start date: 04/20/1998    Quit date: 04/20/2018    Years since quitting: 5.2  . Smokeless tobacco: Never  Vaping Use  . Vaping status: Every Day  Substance and Sexual Activity  . Alcohol use: No  . Drug use: No  . Sexual activity: Not on file  Other Topics Concern  . Not on file  Social History Narrative   Lives with husband and kids   Caffeine use: 2 per day   Right handed    Social Determinants of Health   Financial Resource Strain: Not on file  Food Insecurity: Not on file  Transportation Needs: Not on file  Physical Activity: Not on file  Stress: Not on file  Social  Connections: Not on file   Additional Social History:    Allergies:   Allergies  Allergen Reactions  . Clindamycin Hcl   . Cymbalta [Duloxetine Hcl]     disorientation  . Iodine Other (See Comments)    Had a vaginal procedure using iodine- her vaginal burned and peeled    Labs:  Results for orders placed or performed during the hospital encounter of 08/01/23 (from the past 48 hour(s))  Comprehensive metabolic panel     Status: Abnormal   Collection Time: 08/01/23  1:03 PM  Result Value Ref Range   Sodium 136 135 - 145 mmol/L   Potassium 4.2 3.5 - 5.1 mmol/L   Chloride 100 98 - 111 mmol/L   CO2  25 22 - 32 mmol/L   Glucose, Bld 107 (H) 70 - 99 mg/dL    Comment: Glucose reference range applies only to samples taken after fasting for at least 8 hours.   BUN 9 6 - 20 mg/dL   Creatinine, Ser 1.61 0.44 - 1.00 mg/dL   Calcium 09.6 8.9 - 04.5 mg/dL   Total Protein 7.9 6.5 - 8.1 g/dL   Albumin 5.0 3.5 - 5.0 g/dL   AST 33 15 - 41 U/L   ALT 21 0 - 44 U/L   Alkaline Phosphatase 40 38 - 126 U/L   Total Bilirubin 1.1 0.3 - 1.2 mg/dL   GFR, Estimated >40 >98 mL/min    Comment: (NOTE) Calculated using the CKD-EPI Creatinine Equation (2021)    Anion gap 11 5 - 15    Comment: Performed at [facility], [address] [place], [place] [address]  Ethanol     Status: None   Collection Time: 08/01/23  1:03 PM  Result Value Ref Range   Alcohol, Ethyl (B) <10 <10 mg/dL    Comment: (NOTE) Lowest detectable limit for serum alcohol is 10 mg/dL.  For medical purposes only. Performed at [facility], [address] [place], [place] [address]   Salicylate level     Status: Abnormal   Collection Time: 08/01/23  1:03 PM  Result Value Ref Range   Salicylate Lvl <7.0 (L) 7.0 - 30.0 mg/dL    Comment: Performed at [facility], [address] [place], [place] [address]  Acetaminophen level     Status: Abnormal   Collection Time: 08/01/23  1:03 PM  Result Value  Ref Range   Acetaminophen (Tylenol), Serum <10 (L) 10 - 30 ug/mL    Comment: (NOTE) Therapeutic concentrations vary significantly. A range of 10-30 ug/mL  may be an effective concentration for many patients. However, some  are best treated at concentrations outside of this range. Acetaminophen concentrations >150 ug/mL at 4 hours after ingestion  and >50 ug/mL at 12 hours after ingestion are often associated with  toxic reactions.  Performed at [facility], [address] [place], [place] [address]   cbc     Status: Abnormal   Collection Time: 08/01/23  1:03 PM  Result Value Ref Range   WBC 8.9 4.0 - 10.5 K/uL   RBC 3.77 (L) 3.87 - 5.11 MIL/uL   Hemoglobin 10.9 (L) 12.0 - 15.0 g/dL   HCT 46.9 (L) 62.9 - 52.8 %   MCV 87.3 80.0 - 100.0 fL   MCH 28.9 26.0 - 34.0 pg   MCHC 33.1 30.0 - 36.0 g/dL   RDW 41.3 24.4 - 01.0 %   Platelets 377 150 - 400 K/uL   nRBC 0.0 0.0 - 0.2 %    Comment: Performed at [facility], [address] [place], [place] [address]  TSH     Status: None   Collection Time: 08/01/23  1:03 PM  Result Value Ref Range   TSH 1.570 0.350 - 4.500 uIU/mL    Comment: Performed by a 3rd Generation assay with a functional sensitivity of <=0.01 uIU/mL. Performed at [facility], [address] [place], [place] [address]   T4, free     Status: None   Collection Time: 08/01/23  1:03 PM  Result Value Ref Range   Free T4 1.12 0.61 - 1.12 ng/dL    Comment: (NOTE) Biotin ingestion may interfere with free T4 tests. If the results are inconsistent with  the TSH level, previous test results, or the clinical presentation, then consider biotin interference. If needed, order repeat testing after stopping biotin. Performed at [facility], [address] [place], [place] [address]   Lithium level     Status: Abnormal   Collection Time: 08/01/23  1:03 PM  Result Value Ref Range   Lithium Lvl <0.06 (L) 0.60 - 1.20 mmol/L    Comment:  Performed at [facility], [address] [place], [place] [address]  Urine Drug Screen, Qualitative     Status: Abnormal   Collection Time: 08/01/23  2:25 PM  Result Value Ref Range   Tricyclic, Ur Screen NONE DETECTED NONE DETECTED   Amphetamines, Ur Screen NONE DETECTED NONE DETECTED   MDMA (Ecstasy)Ur Screen NONE DETECTED NONE DETECTED   Cocaine Metabolite,Ur [place] NONE DETECTED NONE DETECTED   Opiate, Ur Screen NONE DETECTED NONE DETECTED   Phencyclidine (PCP) Ur S NONE DETECTED NONE DETECTED   Cannabinoid 50 Ng, Ur [place] POSITIVE (A) NONE DETECTED   Barbiturates, Ur Screen NONE DETECTED NONE DETECTED   Benzodiazepine, Ur Scrn NONE DETECTED NONE DETECTED   Methadone Scn, Ur NONE DETECTED NONE DETECTED    Comment: (NOTE) Tricyclics + metabolites, urine    Cutoff 1000 ng/mL Amphetamines + metabolites, urine  Cutoff 1000 ng/mL MDMA (Ecstasy), urine              Cutoff 500 ng/mL Cocaine Metabolite, urine          Cutoff 300 ng/mL Opiate + metabolites, urine        Cutoff 300 ng/mL Phencyclidine (PCP), urine         Cutoff 25 ng/mL Cannabinoid, urine                 Cutoff 50 ng/mL Barbiturates + metabolites, urine  Cutoff 200 ng/mL Benzodiazepine, urine              Cutoff 200 ng/mL Methadone, urine                   Cutoff 300 ng/mL  The urine drug screen provides only a preliminary, unconfirmed analytical test result and should not be used for non-medical purposes. Clinical consideration and professional judgment should be applied to any positive drug screen result due to possible interfering substances. A more specific alternate chemical method must be used in order to obtain a confirmed analytical result. Gas chromatography / mass spectrometry (GC/MS) is the preferred confirm atory method. Performed at [facility], [address] [place], [place] [address]   POC urine preg, ED     Status: None   Collection Time: 08/01/23  2:27 PM  Result Value Ref Range    Preg Test, Ur NEGATIVE NEGATIVE    Comment:        THE SENSITIVITY OF THIS METHODOLOGY IS >24 mIU/mL     No current facility-administered medications for this encounter.   Current Outpatient Medications  Medication Sig Dispense Refill  . b complex vitamins capsule Take 1 capsule by mouth daily.    . busPIRone (BUSPAR) 10 MG tablet Take 2 tablets (20 mg total) by mouth 3 (three) times daily. For anxiety. 540 tablet 2  . FLUoxetine (PROZAC) 10 MG capsule Take 1 capsule (10 mg total) by mouth daily. For anxiety 90 capsule 0  . gabapentin (NEURONTIN) 300 MG capsule Take 300 mg by mouth 3 (three) times daily.    . hydrOXYzine (VISTARIL) 25 MG capsule TAKE 1 CAPSULE BY MOUTH 2  TIMES DAILY AS NEEDED FOR ANXIETY. 180 capsule 0  . ibuprofen (ADVIL) 800 MG tablet Take 1 tablet (800 mg total) by mouth every 8 (eight) hours as needed for mild pain or moderate pain. 30 tablet 0  . lisinopril (ZESTRIL) 10 MG tablet TAKE 1 TABLET BY MOUTH EVERY DAY FOR BLOOD PRESSURE 90 tablet 0  . mometasone (ELOCON) 0.1 % cream Apply bid to itchy rash at feet prn. Can use up to 4 weeks. 45 g 1  . Multiple Vitamins-Minerals (MULTIVITAMIN WITH MINERALS) tablet Take 1 tablet by mouth daily.    . mupirocin ointment (BACTROBAN) 2 % Place 1 application into the nose 2 (two) times daily. 22 g 0  . omeprazole (PRILOSEC) 40 MG capsule Take 1 capsule (40 mg total) by mouth 2 (two) times daily before a meal. 60 capsule 2  . ondansetron (ZOFRAN) 4 MG tablet Take 1 tablet (4 mg total) by mouth every 8 (eight) hours as needed for nausea or vomiting. 10 tablet 0  . PARoxetine (PAXIL) 10 MG tablet Take 1 tablet (10 mg total) by mouth daily. 90 tablet 3  . tiZANidine (ZANAFLEX) 4 MG tablet Take by mouth.    . triamcinolone cream (KENALOG) 0.1 % Apply topically.    Marland Kitchen trimethoprim (TRIMPEX) 100 MG tablet Take 100 mg by mouth daily. As needed      Musculoskeletal: Strength & Muscle Tone: within normal limits Gait & Station:  normal Patient leans: N/A  Psychiatric Specialty Exam: Physical Exam Vitals and nursing note reviewed.  Constitutional:      Appearance: Normal appearance.  HENT:     Head: Normocephalic.     Nose: Nose normal.  Pulmonary:     Effort: Pulmonary effort is normal.  Musculoskeletal:        General: Normal range of motion.     Cervical back: Normal range of motion.  Neurological:     General: No focal deficit present.     Mental Status: She is alert and oriented to person, place, and time.    Review of Systems  Psychiatric/Behavioral:  The patient is nervous/anxious.   All other systems reviewed and are negative.   Blood pressure (!) 157/85, pulse 93, temperature 98.5 F (36.9 C), temperature source Oral, resp. rate 18, height 5\' 3"  (1.6 m), weight 56.7 kg, SpO2 100%.Body mass index is 22.14 kg/m.  General Appearance: Casual  Eye Contact:  Minimal  Speech:  Normal Rate  Volume:  Normal  Mood:  Anxious and Euphoric  Affect:  Blunt  Thought Process:  Irrelevant  Orientation:  Full (Time, Place, and Person)  Thought Content:  Hallucinations: Visual  Suicidal Thoughts:  No  Homicidal Thoughts:  No  Memory:  Immediate;   Fair Recent;   Poor Remote;   Poor  Judgement:  Impaired  Insight:  Lacking  Psychomotor Activity:  Increased  Concentration:  Concentration: Poor and Attention Span: Poor  Recall:  Poor  Fund of Knowledge:  Fair  Language:  Fair  Akathisia:  No  Handed:  Right  AIMS (if indicated):     Assets:  Housing Intimacy Leisure Time Physical Health Social Support  ADL's:  Intact  Cognition:  Impaired,  Mild  Sleep:        Physical Exam: Physical Exam Vitals and nursing note reviewed.  Constitutional:      Appearance: Normal appearance.  HENT:     Head: Normocephalic.     Nose: Nose normal.  Pulmonary:     Effort: Pulmonary effort  is normal.  Musculoskeletal:        General: Normal range of motion.     Cervical back: Normal range of motion.   Neurological:     General: No focal deficit present.     Mental Status: She is alert and oriented to person, place, and time.   Review of Systems  Psychiatric/Behavioral:  The patient is nervous/anxious.   All other systems reviewed and are negative.  Blood pressure (!) 157/85, pulse 93, temperature 98.5 F (36.9 C), temperature source Oral, resp. rate 18, height 5\' 3"  (1.6 m), weight 56.7 kg, SpO2 100%. Body mass index is 22.14 kg/m.  Treatment Plan Summary: Daily contact with patient to assess and evaluate symptoms and progress in treatment, Medication management, and Plan : Cannabis induced psychosis with delusions: Gabapentin 300 mg TID started Haldol 5 mg once to assist  Disposition: Recommend psychiatric Inpatient admission when medically cleared.  Nanine Means, NP 08/01/2023 5:11 PM

## 2023-08-01 NOTE — ED Triage Notes (Signed)
Patient to ED via POV for mental health evaluation. Brought in by patient's husband. Husband reporting mania and hallucinations. Pt took dogs on walk yesterday without leaches and dogs got away. Not acting like self over the past few months. Been telling husband how she will "save the world." Denies SI/HI. Currently sitting in triage with bandana over eyes.

## 2023-08-01 NOTE — ED Provider Notes (Signed)
[facility] Provider Note    Event Date/Time   First MD Initiated Contact with Patient 08/01/23 1337     (approximate)   History   Psychiatric Evaluation   HPI  Krystal Grant is a 52 y.o. female   Past medical history of anxiety and depression but no other significant health history presents to the emergency department for psychiatric evaluation.  Per husband's report she has been steadily worsening over the last several months since July, with poor sleep, hyperreligiosity, conspiracy theories, and most recently command auditory hallucinations.  Husband notes that there have been episodes in the past similar.  Husband also notes that the patient's family history is significant for psychiatric disease, including mania.  Patient states that she feels highly anxious and has not slept in many days.  She denies any drug or alcohol use aside from occasional marijuana.  She denies any previous psychiatric history.  She states that she stopped taking all of her medications and instead is taking various supplements online and claims that she has been ordering lithium on [employer] to take.   Independent Historian contributed to assessment above: Husband Jeannett Senior corroborates information given above.       Physical Exam   Triage Vital Signs: ED Triage Vitals  Encounter Vitals Group     BP 08/01/23 1300 (!) 157/85     Systolic BP Percentile --      Diastolic BP Percentile --      Pulse Rate 08/01/23 1300 93     Resp 08/01/23 1300 18     Temp 08/01/23 1300 98.5 F (36.9 C)     Temp Source 08/01/23 1300 Oral     SpO2 08/01/23 1300 100 %     Weight 08/01/23 1301 125 lb (56.7 kg)     Height 08/01/23 1301 5\' 3"  (1.6 m)     Head Circumference --      Peak Flow --      Pain Score 08/01/23 1301 0     Pain Loc --      Pain Education --      Exclude from Growth Chart --     Most recent vital signs: Vitals:   08/01/23 1300  BP: (!) 157/85  Pulse: 93  Resp: 18   Temp: 98.5 F (36.9 C)  SpO2: 100%    General: Awake, no distress.  CV:  Good peripheral perfusion.  Resp:  Normal effort.  Abd:  No distention.  Other:  Hyperactive patient, difficult to interrupt, stream of consciousness speaking.  Hypertensive otherwise vital signs normal.  No tremor or tongue fasciculation.  No autonomic instability.  Moving all extremities.  No signs of trauma.  Clear lungs soft nontender abdomen.   ED Results / Procedures / Treatments   Labs (all labs ordered are listed, but only abnormal results are displayed) Labs Reviewed  COMPREHENSIVE METABOLIC PANEL - Abnormal; Notable for the following components:      Result Value   Glucose, Bld 107 (*)    All other components within normal limits  SALICYLATE LEVEL - Abnormal; Notable for the following components:   Salicylate Lvl <7.0 (*)    All other components within normal limits  ACETAMINOPHEN LEVEL - Abnormal; Notable for the following components:   Acetaminophen (Tylenol), Serum <10 (*)    All other components within normal limits  CBC - Abnormal; Notable for the following components:   RBC 3.77 (*)    Hemoglobin 10.9 (*)    HCT 32.9 (*)  All other components within normal limits  URINE DRUG SCREEN, QUALITATIVE ([facility] ONLY) - Abnormal; Notable for the following components:   Cannabinoid 50 Ng, Ur [place] POSITIVE (*)    All other components within normal limits  LITHIUM LEVEL - Abnormal; Notable for the following components:   Lithium Lvl <0.06 (*)    All other components within normal limits  ETHANOL  TSH  T4, FREE  POC URINE PREG, ED     I ordered and reviewed the above labs they are notable for her lithium level is negative, and her urine tox is positive for cannabinoid.   RADIOLOGY I independently reviewed and interpreted CT scan of the head see no obvious bleeding or midline shift I also reviewed radiologist's formal read.   PROCEDURES:  Critical Care performed:  No  Procedures   MEDICATIONS ORDERED IN ED: Medications  diazepam (VALIUM) tablet 5 mg (5 mg Oral Given 08/01/23 1449)    IMPRESSION / MDM / ASSESSMENT AND PLAN / ED COURSE  I reviewed the triage vital signs and the nursing notes.                                Patient's presentation is most consistent with acute presentation with potential threat to life or bodily function.  Differential diagnosis includes, but is not limited to, acute decompensated psychiatric illness, manic episode, thyroid dysfunction, electrolyte dysfunction, substance-induced mood disorder, intracranial bleeding or other intracranial pathologies   The patient is on the cardiac monitor to evaluate for evidence of arrhythmia and/or significant heart rate changes.  MDM:    Patient with first-time psychiatric symptoms consistent with mania.  Has been using marijuana may be substance-induced.  Considered organic causes of thyroid dysfunction though thyroid labs are normal, states that she has been taking lithium, though lithium levels are normal.  No significant drug use other than marijuana, no signs of withdrawal.  Other labs within normal limits.  Considered intracranial pathology but CT head looks normal on my read pending final read from radiologist.  Pending final read from CT, will medically clear for psychiatric evaluation.  Given her acute psychiatric condition, have placed her under IVC.       FINAL CLINICAL IMPRESSION(S) / ED DIAGNOSES   Final diagnoses:  Mania (HCC)     Rx / DC Orders   ED Discharge Orders     None        Note:  This document was prepared using Dragon voice recognition software and may include unintentional dictation errors.    Pilar Jarvis, MD 08/01/23 [phone]

## 2023-08-01 NOTE — ED Notes (Signed)
IVC/ Per Asher Muir, NP, pt is recommended for inpatient psychiatric admission.

## 2023-08-01 NOTE — BH Assessment (Signed)
Comprehensive Clinical Assessment (CCA) Note  08/01/2023 Krystal Grant [id number]  Krystal Grant, 52 year old female who [facility]. Per triage note, Patient to ED via POV for mental health evaluation. Brought in by patient's husband. Husband reporting mania and hallucinations. Pt took dogs on walk yesterday without leashes and dogs got away. Not acting like self over the past few months. Been telling husband how she will "save the world." Denies SI/HI. Currently sitting in triage with bandana over eyes.   During TTS assessment pt presents alert and oriented x 4, restless but cooperative, and mood-congruent with affect. The pt appears to be responding to internal stimuli. Pt is presenting with delusional thinking. Pt verified the information provided to triage RN.   Upon evaluation, patient is laying in the bed with blanket partially over her head throughout the interview. Pt identifies her main complaint to be that she is "vulnerable". Patient states she is not depressed but has constant panic attacks. Patient reports taking Lithium (purchased online), marijuana daily, coffee, and vanilla to help her mood. Patient becomes upset and states she [place] by Viviann Spare and [place]. Patient begins to ramble about rainbows going around the sun.    Psych Team spoke to patient's husband, Nikia Weingart for additional information. Viviann Spare reports looking back there were signs a couple of months ago where patient was "forgetful; start doing things but would not complete them." Patient states patient was studying a different religion and changing her point of view. He says patient was taking a "holistic approach" where she stopped taking her medication and started smoking marijuana. Viviann Spare reports the patient's marijuana use increased tremendously, and patient began to vape. Viviann Spare mentioned patient has significant family history of mental health illness with  patient's son and sister. Viviann Spare reports poor sleep; stating patient has not slept in 2 weeks. "I just want my wife back."    Per Asher Muir, NP, pt is recommended for inpatient psychiatric admission.    Chief Complaint:  Chief Complaint  Patient presents with   Psychiatric Evaluation   Visit Diagnosis: Diagnosis deferred    CCA Screening, Triage and Referral (STR)  Patient Reported Information How did you hear about [place]? Family/Friend  Referral name: No data recorded Referral phone number: No data recorded  Whom do you see for routine medical problems? No data recorded Practice/Facility Name: No data recorded Practice/Facility Phone Number: No data recorded Name of Contact: No data recorded Contact Number: No data recorded Contact Fax Number: No data recorded Prescriber Name: No data recorded Prescriber Address (if known): No data recorded  What Is the Reason for Your Visit/Call Today? Patient brought to ED for bizarre behavior and hallucinations.  How Long Has This Been Causing You Problems? 1-6 months  What Do You Feel Would Help You the Most Today? Medication(s); Treatment for Depression or other mood problem   Have You Recently Been in Any Inpatient Treatment (Hospital/Detox/Crisis Center/28-Day Program)? No data recorded Name/Location of Program/Hospital:No data recorded How Long Were You There? No data recorded When Were You Discharged? No data recorded  Have You Ever Received Services From [facility] Before? No data recorded Who Do You See at [facility]? No data recorded  Have You Recently Had Any Thoughts About Hurting Yourself? No data recorded Are You Planning to Commit Suicide/Harm Yourself At This time? No data recorded  Have you Recently Had Thoughts About Hurting Someone [name]? No data recorded Explanation: No data recorded  Have You Used Any Alcohol or Drugs in the Past 24 Hours? Yes  How Long Ago Did You Use Drugs or Alcohol? No data recorded What Did  You Use and How Much? Marijuana   Do You Currently Have a Therapist/Psychiatrist? No  Name of Therapist/Psychiatrist: No data recorded  Have You Been Recently Discharged From Any Office Practice or Programs? No  Explanation of Discharge From Practice/Program: No data recorded    CCA Screening Triage Referral Assessment Type of Contact: Face-to-Face  Is this Initial or Reassessment? No data recorded Date Telepsych consult ordered in [facility]:  No data recorded Time Telepsych consult ordered in [facility]:  No data recorded  Patient Reported Information Reviewed? No data recorded Patient Left Without Being Seen? No data recorded Reason for Not Completing Assessment: No data recorded  Collateral Involvement: Yamika Cifarelli- husband [phone]   Does Patient Have a Court Appointed Legal Guardian? No data recorded Name and Contact of Legal Guardian: No data recorded If Minor and Not Living with Parent(s), Who has Custody? No data recorded Is CPS involved or ever been involved? No data recorded Is APS involved or ever been involved? No data recorded  Patient Determined To Be At Risk for Harm To Self or Others Based on Review of Patient Reported Information or Presenting Complaint? No  Method: No Plan  Availability of Means: No access or NA  Intent: Vague intent or NA  Notification Required: No need or identified person  Additional Information for Danger to Others Potential: No data recorded Additional Comments for Danger to Others Potential: No data recorded Are There Guns or Other Weapons in Your Home? No data recorded Types of Guns/Weapons: No data recorded Are These Weapons Safely Secured?                            No data recorded Who Could Verify You Are Able To Have These Secured: No data recorded Do You Have any Outstanding Charges, Pending Court Dates, Parole/Probation? No data recorded Contacted To Inform of Risk of Harm To Self or Others: No data recorded  Location of  Assessment: [facility] ED   Does Patient Present under Involuntary Commitment? Yes  IVC Papers Initial File Date: No data recorded  [place] of Residence: [place]   Patient Currently Receiving the Following Services: Not Receiving Services   Determination of Need: Emergent (2 hours)   Options For Referral: ED Visit; Inpatient Hospitalization; Medication Management; Outpatient Therapy     CCA Biopsychosocial Intake/Chief Complaint:  No data recorded Current Symptoms/Problems: No data recorded  Patient Reported Schizophrenia/Schizoaffective Diagnosis in Past: No   Strengths: Patient is able to cimmunicate and verbalize needs.  Preferences: No data recorded Abilities: No data recorded  Type of Services Patient Feels are Needed: No data recorded  Initial Clinical Notes/Concerns: No data recorded  Mental Health Symptoms Depression:   Difficulty Concentrating   Duration of Depressive symptoms:  Greater than two weeks   Mania:   Change in energy/activity; Racing thoughts   Anxiety:    Difficulty concentrating; Restlessness; Sleep; Tension; Worrying   Psychosis:   Delusions; Hallucinations   Duration of Psychotic symptoms:  Less than six months   Trauma:   N/A   Obsessions:   N/A   Compulsions:   N/A   Inattention:   Forgetful   Hyperactivity/Impulsivity:   Feeling of restlessness   Oppositional/Defiant Behaviors:   N/A   Emotional Irregularity:   Potentially harmful impulsivity  Other Mood/Personality Symptoms:  No data recorded   Mental Status Exam Appearance and self-care  Stature:   Average   Weight:   Average weight   Clothing:   Casual   Grooming:   Normal   Cosmetic use:   None   Posture/gait:   Bizarre   Motor activity:   Not Remarkable   Sensorium  Attention:   Confused   Concentration:   Scattered; Anxiety interferes   Orientation:   Person; Situation; Place; Time   Recall/memory:  No data recorded  Affect  and Mood  Affect:   Anxious; Tearful   Mood:   Anxious   Relating  Eye contact:   Avoided   Facial expression:   Tense; Anxious   Attitude toward examiner:   Cooperative; Guarded   Thought and Language  Speech flow:  Flight of Ideas; Pressured   Thought content:   Appropriate to Mood and Circumstances; Delusions   Preoccupation:   None   Hallucinations:   Auditory   Organization:  No data recorded  [employer] of Knowledge:   Average   Intelligence:   Average   Abstraction:   Overly abstract   Judgement:   Impaired   Reality Testing:   Distorted   Insight:   Flashes of insight   Decision Making:   Impulsive   Social Functioning  Social Maturity:   Impulsive   Social Judgement:   Heedless   Stress  Stressors:   Illness   Coping Ability:   Human resources officer Deficits:   Decision making   Supports:   Family     Religion: Religion/Spirituality Are You A Religious Person?: Yes  Leisure/Recreation:    Exercise/Diet: Exercise/Diet Do You Have Any Trouble Sleeping?: Yes Explanation of Sleeping Difficulties: Per collateral contact, patient has not slept in 2 weeks.   CCA Employment/Education Employment/Work Situation:    Education:     CCA Family/Childhood History Family and Relationship History: Family history Marital status: Married Number of Years Married: 15 Does patient have children?: Yes  Childhood History:     Child/Adolescent Assessment:     CCA Substance Use Alcohol/Drug Use: Alcohol / Drug Use Pain Medications: See PTA Prescriptions: See PTA Over the Counter: See PTA History of alcohol / drug use?: Yes Substance #1 Name of Substance 1: Marijuana                       ASAM's:  Six Dimensions of Multidimensional Assessment  Dimension 1:  Acute Intoxication and/or Withdrawal Potential:      Dimension 2:  Biomedical Conditions and Complications:      Dimension 3:   Emotional, Behavioral, or Cognitive Conditions and Complications:     Dimension 4:  Readiness to Change:     Dimension 5:  Relapse, Continued use, or Continued Problem Potential:     Dimension 6:  Recovery/Living Environment:     ASAM Severity Score:    ASAM Recommended Level of Treatment:     Substance use Disorder (SUD)    Recommendations for Services/Supports/Treatments:    DSM5 Diagnoses: Patient Active Problem List   Diagnosis Date Noted   Mania (HCC) 08/01/2023   Fatigue 04/18/2023   Gastric ulcer 04/13/2023   Osteoarthritis of hip 04/04/2023   Chronic hip pain, bilateral 03/23/2023   Anemia 12/08/2022   Dyslipidemia 07/27/2022   Chest pain 07/06/2022   Chronic fatigue 06/03/2022   Restless legs 06/03/2022   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   Chronic  back pain 06/24/2021   Encounter for annual general medical examination with abnormal findings in adult 01/15/2019   Hypertension 01/02/2019   Vertigo 06/20/2018   GAD (generalized anxiety disorder) 06/20/2018   Arthritis 06/20/2018    Patient Centered Plan: Patient is on the following Treatment Plan(s):  Anxiety   Referrals to Alternative Service(s): Referred to Alternative Service(s):   Place:   Date:   Time:    Referred to Alternative Service(s):   Place:   Date:   Time:    Referred to Alternative Service(s):   Place:   Date:   Time:    Referred to Alternative Service(s):   Place:   Date:   Time:      @BHCOLLABOFCARE @  Trevis [place] R Theatre manager, Counselor, LCAS-A

## 2023-08-01 NOTE — ED Notes (Signed)
Pt's husband is taking pt belongings home.

## 2023-08-01 NOTE — ED Notes (Signed)
Husband took home all belongings.

## 2023-08-01 NOTE — ED Notes (Signed)
Pt loudly states, I need to go out and vape, I need valium and I want my family.  Md aware.  Meds ordered

## 2023-08-01 NOTE — ED Notes (Signed)
Pt with intermittent outbursts of yelling then apologetic.

## 2023-08-01 NOTE — ED Notes (Signed)
Hospital meal provided.  100% consumed, pt tolerated w/o complaints.  Waste discarded appropriately.   

## 2023-08-01 NOTE — ED Notes (Signed)
Pt yell

## 2023-08-02 DIAGNOSIS — F39 Unspecified mood [affective] disorder: Secondary | ICD-10-CM | POA: Diagnosis not present

## 2023-08-02 DIAGNOSIS — G2581 Restless legs syndrome: Secondary | ICD-10-CM | POA: Diagnosis not present

## 2023-08-02 DIAGNOSIS — F515 Nightmare disorder: Secondary | ICD-10-CM | POA: Diagnosis not present

## 2023-08-02 DIAGNOSIS — Z91148 Patient's other noncompliance with medication regimen for other reason: Secondary | ICD-10-CM | POA: Diagnosis not present

## 2023-08-02 DIAGNOSIS — F319 Bipolar disorder, unspecified: Secondary | ICD-10-CM | POA: Diagnosis not present

## 2023-08-02 DIAGNOSIS — I10 Essential (primary) hypertension: Secondary | ICD-10-CM | POA: Diagnosis not present

## 2023-08-02 DIAGNOSIS — F309 Manic episode, unspecified: Secondary | ICD-10-CM | POA: Diagnosis not present

## 2023-08-02 DIAGNOSIS — F312 Bipolar disorder, current episode manic severe with psychotic features: Secondary | ICD-10-CM | POA: Diagnosis not present

## 2023-08-02 DIAGNOSIS — R110 Nausea: Secondary | ICD-10-CM | POA: Diagnosis not present

## 2023-08-02 DIAGNOSIS — F411 Generalized anxiety disorder: Secondary | ICD-10-CM | POA: Diagnosis not present

## 2023-08-02 LAB — RESP PANEL BY RT-PCR (RSV, FLU A&B, COVID)  RVPGX2
Influenza A by PCR: NEGATIVE
Influenza B by PCR: NEGATIVE
Resp Syncytial Virus by PCR: NEGATIVE
SARS Coronavirus 2 by RT PCR: NEGATIVE

## 2023-08-02 NOTE — ED Notes (Signed)
EMTALA reviewed by this RN.  

## 2023-08-02 NOTE — ED Provider Notes (Signed)
Emergency Medicine Observation Re-evaluation Note  Arnetta Wubben is a 52 y.o. female, seen on rounds today.  Pt initially presented to the ED for complaints of Psychiatric Evaluation Currently, the patient is resting.  Physical Exam  BP 133/82 (BP Location: Right Arm)   Pulse 75   Temp 98.3 F (36.8 C) (Oral)   Resp 18   Ht 1.6 m (5\' 3" )   Wt 56.7 kg   SpO2 98%   BMI 22.14 kg/m  Physical Exam Gen:  No acute distress Resp:  Breathing easily and comfortably, no accessory muscle usage Neuro:  Moving all four extremities, no gross focal neuro deficits Psych:  Resting currently, calm when awake  ED Course / MDM  EKG:   I have reviewed the labs performed to date as well as medications administered while in observation.  Recent changes in the last 24 hours include initial EDP evaluation as well as psychiatry evaluation.  Plan  Current plan is for transfer to [facility] this morning after 9 AM.    Loleta Rose, MD 08/02/23 [phone]

## 2023-08-02 NOTE — Telephone Encounter (Signed)
Noted. Will review ED notes. 

## 2023-08-02 NOTE — BH Assessment (Signed)
Per [facility] AC Selena Batten B.), patient to be referred out of system.  Referral information for Psychiatric Hospitalization faxed to;   [facility] [phone]- [phone]) No available beds  [facility] [phone]- [phone]),   [facility] [phone]) [phone]  [facility] [phone]),  [address] [phone]),   [facility] [phone] -or- [phone]),   [facility] [phone])  [place] [phone] or [phone]),

## 2023-08-02 NOTE — ED Notes (Signed)
 [place]  [facility]  called  for  transport to  [facility]

## 2023-08-02 NOTE — Telephone Encounter (Signed)
Per chart review pt is at [facility] ED pending transfer to [facility]. I spoke with Allie at [facility] ED and confirmed pt is being transferred to [facility] today. Sending note to Allayne Gitelman NP.

## 2023-08-02 NOTE — BH Assessment (Addendum)
PATIENT BED AVAILABLE AFTER 9AM ON 08/02/23  Patient has been accepted to [facility].  Accepting physician is Dr. Shanon Payor.  Call report to [phone].  Representative was [employer].   ER Staff is aware of it:  [facility] ER Secretary  Dr. Katrinka Blazing, ER MD  Amy Patient's Nurse     Patient's Family/Support System Viviann Spare [place] ([phone]) have been updated as well. Husband provided with the address and phone number of the facility

## 2023-08-02 NOTE — ED Notes (Signed)
Transferred to [facility] via [facility]. Pt alert and oriented X4, cooperative, RR even and unlabored, color WNL. Pt in NAD. Husband previously took home belongings. Breakfast sent with patient. Husband aware of pt transfer, pt able to speak with him on phone prior to transfer.

## 2023-08-02 NOTE — ED Notes (Signed)
IVC/Patient has been accepted to [facility]

## 2023-08-16 ENCOUNTER — Ambulatory Visit (INDEPENDENT_AMBULATORY_CARE_PROVIDER_SITE_OTHER): Payer: Medicaid Other | Admitting: Primary Care

## 2023-08-16 ENCOUNTER — Encounter: Payer: Self-pay | Admitting: Primary Care

## 2023-08-16 VITALS — BP 122/80 | HR 82 | Temp 98.2°F | Ht 63.0 in | Wt 119.0 lb

## 2023-08-16 DIAGNOSIS — G479 Sleep disorder, unspecified: Secondary | ICD-10-CM | POA: Diagnosis not present

## 2023-08-16 DIAGNOSIS — I10 Essential (primary) hypertension: Secondary | ICD-10-CM | POA: Diagnosis not present

## 2023-08-16 DIAGNOSIS — F411 Generalized anxiety disorder: Secondary | ICD-10-CM

## 2023-08-16 DIAGNOSIS — F12950 Cannabis use, unspecified with psychotic disorder with delusions: Secondary | ICD-10-CM

## 2023-08-16 MED ORDER — TRAZODONE HCL 50 MG PO TABS
50.0000 mg | ORAL_TABLET | Freq: Every day | ORAL | 0 refills | Status: DC
Start: 2023-08-16 — End: 2023-10-05

## 2023-08-16 NOTE — Assessment & Plan Note (Signed)
Uncontrolled with recent hospitalization.  Follow-up with [facility] as scheduled.  Strongly advised that she request a psychiatrist as well as a therapist.  Remain off medications for now.

## 2023-08-16 NOTE — Assessment & Plan Note (Signed)
With recent emergency department visit and psychiatric hospitalization. Reviewed emergency department notes and labs.  Strongly advised that she follow-up with [facility] as scheduled this week.  She will need therapy and a psychiatrist for ongoing management of her symptoms.  We also discussed follow-up with GYN for ongoing hot flashes as she has failed nonhormonal treatment.  Will initiate trazodone to see if this helps with sleep.  If no improvement then she will need to seek assistance from psychiatry.

## 2023-08-16 NOTE — Patient Instructions (Signed)
Start Trazodone 50 mg at bedtime for sleep.  Follow up with [facility] as scheduled.  Keep me in the loop!  It was a pleasure to see you today!

## 2023-08-16 NOTE — Progress Notes (Signed)
Subjective:    Patient ID: Krystal Grant, female    DOB: Sep 17, 1971, 52 y.o.   MRN: [id number]  HPI  Krystal Grant is a very pleasant 52 y.o. female with a history of hypertension, cannabis induced psychotic disorder with delusions, GAD, chronic fatigue, chronic back pain who presents today for hospital follow-up.  Her husband and son joined Krystal Grant today.  She presented to [facility] ED on 08/01/2023 for psychiatric evaluation.  She was accompanied by her husband who had noticed steadily worsening anxiety, hyperreligiosity, conspiracy theories, poor sleep, auditory hallucinations.  Her husband mentions that she had not been herself since returning from [place] after visiting her mother which was stressful.  She has a chronic history of marijuana use for which she uses to treat her pain.  While in [place] she purchased some medical marijuana for which her husband thought may have been too strong. She was also taking 5 mg of lithium oxalate that she received from Krystal Grant.  She was diagnosed with cannabis induced psychosis with delusions and admitted to psychiatric hospital for further evaluation.  During her emergency department stay she was initiated on gabapentin 300 mg 3 times daily and Haldol 5 mg once.  She was transferred to [facility] on 08/02/23. She was at [facility] for 8 days. She was initiated on Abilify, Lithium, hydroxyzine, venlafaxine, prazosin. Since her hospital stay she has not continued these medications as they caused side effects and made her feel worse.   She weaned herself off of all of her medications several months ago including fluoxetine and buspirone as they were ineffective. She is currently taking Tizanidine 4 mg HS for sleep and muscle pain, promethazine for nausea and vomiting. She tells Krystal Grant today that she's historically smoked marijuana daily to help her pain and anxiety.  She has an appointment scheduled with [facility] on Friday this week.   Her husband discusses a chronic history  of intermittent episodes where she will become hyperactive several days in a row with sleep deprivation. Her biggest concern is her sleep. She has difficulty staying asleep. She can fall asleep but will wake up with anxiety and hot flashes within 3-4 hours. She will mostly stay up for the rest of the night, sometimes will wake every 1 hour. Her hot flashes cause increased nausea, anxiety, tingling sensations to her face, nasal congestion.   She follows with GYN who recommended paroxetine for her hot flashes.  She never took this medication as she was was concerned about it being in the same class as her Prozac.  Increased doses of Prozac caused her depression to become worse.  She is requesting a short course of Valium to help her sleep.  She does not want to take anything on a regular basis for sleep.  BP Readings from Last 3 Encounters:  08/16/23 122/80  08/02/23 (!) 146/80  05/16/23 108/82      Review of Systems  HENT:  Positive for congestion.   Respiratory:  Negative for shortness of breath.   Cardiovascular:  Negative for chest pain.  Genitourinary:        Hot flashes  Psychiatric/Behavioral:  Positive for sleep disturbance. The patient is nervous/anxious.          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    Social History   Socioeconomic History   Marital status:  Married    Spouse name: Not on file   Number of children: 3   Years of education: 12   Highest education level: Not on file  Occupational History   Occupation: [employer]   Tobacco Use   Smoking status: Every Day    Types: E-cigarettes   Smokeless tobacco: Never  Vaping Use   Vaping status: Every Day  Substance and Sexual Activity   Alcohol use: No   Drug use: No   Sexual activity: Not on file  Other Topics Concern   Not on file  Social History Narrative   Lives with husband and kids   Caffeine use: 2 per day   Right handed    Social Determinants of Health   Financial Resource Strain: Not on file  Food Insecurity: Not on file  Transportation Needs: Not on file  Physical Activity: Not on file  Stress: Not on file  Social Connections: Not on file  Intimate Partner Violence: Not on file    Past Surgical History:  Procedure Laterality Date   CARPAL TUNNEL RELEASE  06/28/2017   COLONOSCOPY WITH PROPOFOL N/A 04/13/2023   Procedure: COLONOSCOPY WITH PROPOFOL;  Surgeon: Toney Reil, MD;  Location: [facility];  Service: Endoscopy;  Laterality: N/A;   ELBOW ARTHROSCOPY  06/28/2017   ESOPHAGOGASTRODUODENOSCOPY (EGD) WITH PROPOFOL N/A 04/13/2023   Procedure: ESOPHAGOGASTRODUODENOSCOPY (EGD) WITH PROPOFOL;  Surgeon: Toney Reil, MD;  Location: [facility];  Service: Endoscopy;  Laterality: N/A;   XI ROBOTIC LAPAROSCOPIC ASSISTED APPENDECTOMY N/A 09/18/2021   Procedure: XI ROBOTIC LAPAROSCOPIC ASSISTED APPENDECTOMY;  Surgeon: Sung Amabile, DO;  Location: [facility];  Service: General;  Laterality: N/A;    Family History  Problem Relation Age of Onset   Migraines Mother    Hypertension Mother    Heart attack Mother 67   Nephrolithiasis Mother    Alcohol abuse Sister    Depression Sister    Arthritis Maternal Grandmother    Hearing loss Maternal Grandmother    Heart attack Maternal Grandfather    Cancer Paternal Grandmother        Breast    Hearing loss Paternal Grandmother    Heart attack Paternal Grandmother    Breast cancer Paternal Grandmother    Heart attack Paternal Grandfather    Asthma Son    Depression Son    Mental illness Son    Learning disabilities Son    Uterine cancer Maternal Aunt     Allergies  Allergen Reactions   Clindamycin Hcl    Cymbalta [Duloxetine Hcl]     disorientation   Iodine Other (See Comments)    Had a vaginal procedure using iodine- her vaginal burned and peeled    Current Outpatient Medications on File Prior to Visit  Medication Sig Dispense Refill   b complex vitamins capsule Take 1 capsule by mouth daily.     mometasone (ELOCON) 0.1 % cream Apply bid to itchy rash at feet prn. Can use up to 4 weeks.  45 g 1   Multiple Vitamins-Minerals (MULTIVITAMIN WITH MINERALS) tablet Take 1 tablet by mouth daily.     mupirocin ointment (BACTROBAN) 2 % Place 1 application into the nose 2 (two) times daily. 22 g 0   tiZANidine (ZANAFLEX) 4 MG tablet Take by mouth.     trimethoprim (TRIMPEX) 100 MG tablet Take 100 mg by mouth daily. As needed     ARIPiprazole (ABILIFY) 5 MG tablet Take 5 mg by mouth daily. (Patient not taking: Reported on 08/16/2023)     busPIRone (BUSPAR) 10 MG tablet Take 2 tablets (20 mg total) by mouth 3 (three) times daily. For anxiety. (Patient not taking: Reported on 08/16/2023) 540 tablet 2   gabapentin (NEURONTIN) 300 MG capsule Take 300 mg by mouth 3 (three) times daily. (Patient not taking: Reported on 08/16/2023)     hydrOXYzine (VISTARIL) 25 MG capsule TAKE 1 CAPSULE BY MOUTH 2 TIMES DAILY AS NEEDED FOR ANXIETY. (Patient not taking: Reported on 08/16/2023) 180 capsule 0   ibuprofen (ADVIL) 800 MG tablet Take 1 tablet (800 mg total) by mouth every 8 (eight) hours as needed for mild pain or moderate pain. (Patient not taking: Reported on 08/16/2023) 30 tablet 0   lisinopril (ZESTRIL) 10 MG tablet TAKE 1 TABLET BY MOUTH EVERY DAY FOR BLOOD PRESSURE (Patient not taking: Reported on  08/16/2023) 90 tablet 0   omeprazole (PRILOSEC) 40 MG capsule Take 1 capsule (40 mg total) by mouth 2 (two) times daily before a meal. 60 capsule 2   ondansetron (ZOFRAN) 4 MG tablet Take 1 tablet (4 mg total) by mouth every 8 (eight) hours as needed for nausea or vomiting. (Patient not taking: Reported on 08/16/2023) 10 tablet 0   prazosin (MINIPRESS) 1 MG capsule Take 1 mg by mouth at bedtime. (Patient not taking: Reported on 08/16/2023)     promethazine (PHENERGAN) 25 MG tablet Take 25 mg by mouth every 6 (six) hours as needed. (Patient not taking: Reported on 08/16/2023)     triamcinolone cream (KENALOG) 0.1 % Apply topically. (Patient not taking: Reported on 08/16/2023)     No current facility-administered medications on file prior to visit.    BP 122/80   Pulse 82   Temp 98.2 F (36.8 C) (Temporal)   Ht 5\' 3"  (1.6 m)   Wt 119 lb (54 kg)   SpO2 99%   BMI 21.08 kg/m  Objective:   Physical Exam Cardiovascular:     Rate and Rhythm: Normal rate and regular rhythm.  Pulmonary:     Effort: Pulmonary effort is normal.     Breath sounds: Normal breath sounds.  Musculoskeletal:     Cervical back: Neck supple.  Skin:    General: Skin is warm and dry.  Neurological:     Mental Status: She is alert and oriented to person, place, and time.  Psychiatric:        Mood and Affect: Mood normal.           Assessment & Plan:  Sleep disturbance Assessment & Plan: Seems to be combination of vasomotor symptoms from menopause and anxiety.  Will avoid controlled substances and medications that are habit-forming. Will trial trazodone 50 mg at bedtime.  Prescription provided today.  If this is not effective then she will need to seek consultation from psychiatry.  He was also advised that she follow-up with her GYN for ongoing hot flashes as she has failed nonhormonal treatment.  Orders: -     traZODone HCl; Take 1 tablet (  50 mg total) by mouth at bedtime. For sleep  Dispense: 90 tablet;  Refill: 0  Primary hypertension Assessment & Plan: Controlled.  Remain off lisinopril 10 mg for now. Continue to monitor.   Cannabis-induced psychotic disorder with delusions [facility]) Assessment & Plan: With recent emergency department visit and psychiatric hospitalization. Reviewed emergency department notes and labs.  Strongly advised that she follow-up with [facility] as scheduled this week.  She will need therapy and a psychiatrist for ongoing management of her symptoms.  We also discussed follow-up with GYN for ongoing hot flashes as she has failed nonhormonal treatment.  Will initiate trazodone to see if this helps with sleep.  If no improvement then she will need to seek assistance from psychiatry.   GAD (generalized anxiety disorder) Assessment & Plan: Uncontrolled with recent hospitalization.  Follow-up with [facility] as scheduled.  Strongly advised that she request a psychiatrist as well as a therapist.  Remain off medications for now.         Doreene Nest, NP

## 2023-08-16 NOTE — Assessment & Plan Note (Signed)
Seems to be combination of vasomotor symptoms from menopause and anxiety.  Will avoid controlled substances and medications that are habit-forming. Will trial trazodone 50 mg at bedtime.  Prescription provided today.  If this is not effective then she will need to seek consultation from psychiatry.  He was also advised that she follow-up with her GYN for ongoing hot flashes as she has failed nonhormonal treatment.

## 2023-08-16 NOTE — Assessment & Plan Note (Signed)
Controlled.  Remain off lisinopril 10 mg for now. Continue to monitor.

## 2023-08-23 DIAGNOSIS — F310 Bipolar disorder, current episode hypomanic: Secondary | ICD-10-CM | POA: Diagnosis not present

## 2023-08-31 DIAGNOSIS — F4310 Post-traumatic stress disorder, unspecified: Secondary | ICD-10-CM | POA: Diagnosis not present

## 2023-08-31 IMAGING — MR MR CERVICAL SPINE W/O CM
5 series · 34 of 48 positions shown · non-contrast
Comparison: No prior cervical spine MRI.

CLINICAL DATA: Neck pain radiating to both arms

EXAM:
MRI CERVICAL SPINE WITHOUT CONTRAST
TECHNIQUE: Multiplanar, multisequence MR imaging of the cervical spine was
performed. No intravenous contrast was administered.

[Series 2: T2 · sagittal · 3.0mm · 0.41mm/px · 6 of 19 slices shown (1 of 2)]
[im 1/19]
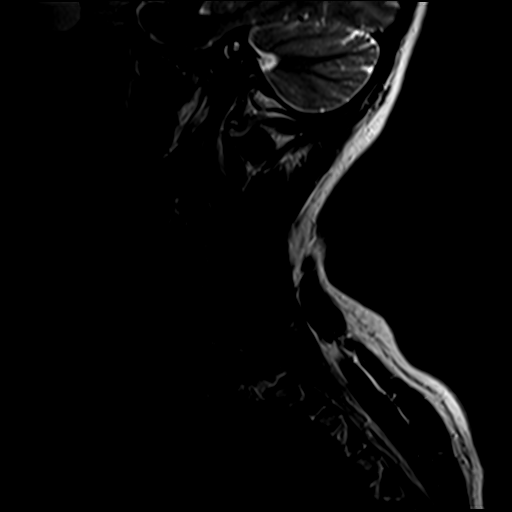
[im 4/19]
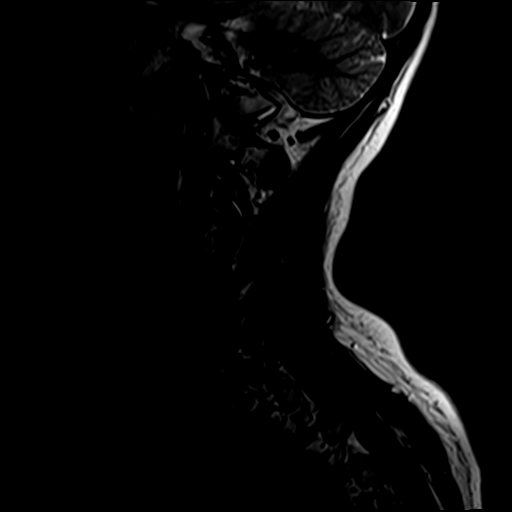
[im 8/19]
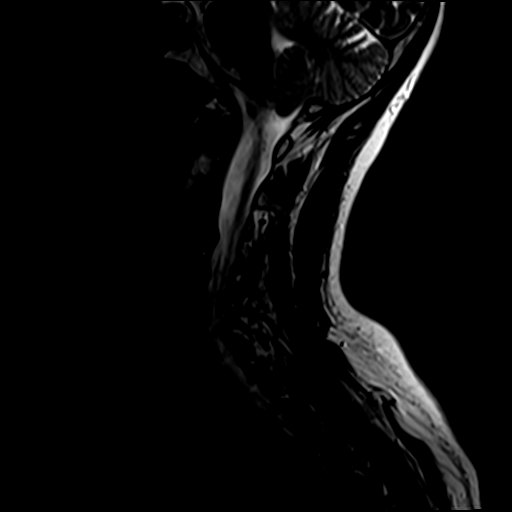
[im 11/19]
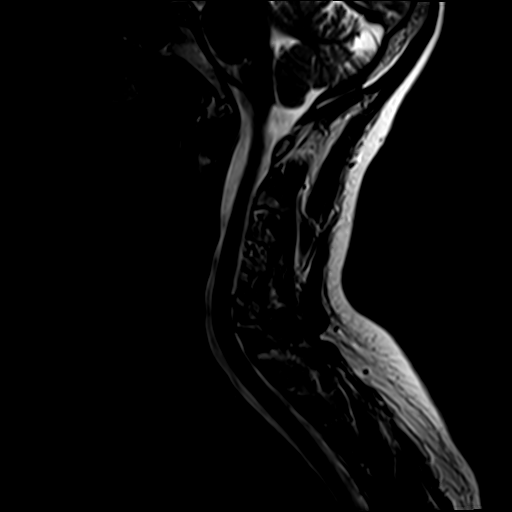
[im 15/19]
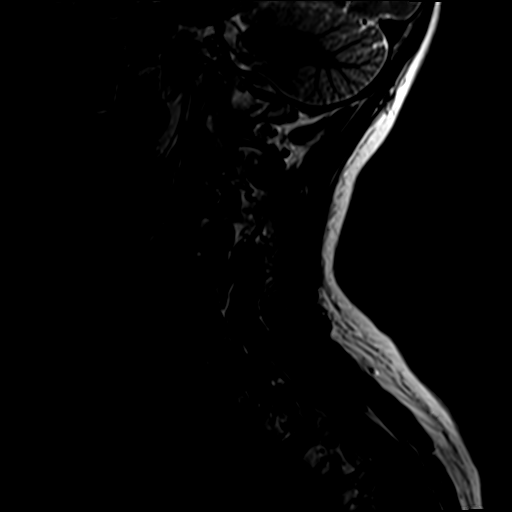
[im 19/19]
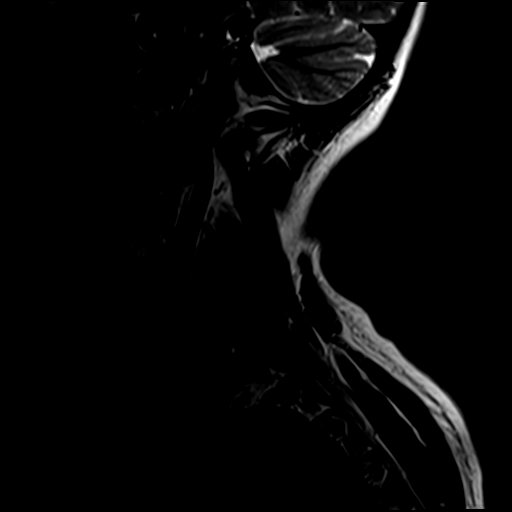

[Series 3: STIR · sagittal · 3.0mm · 0.82mm/px · 7 of 19 slices shown]
[im 1/19]
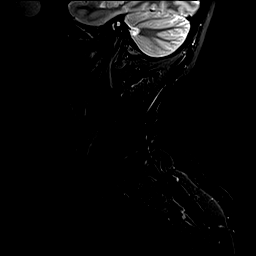
[im 4/19]
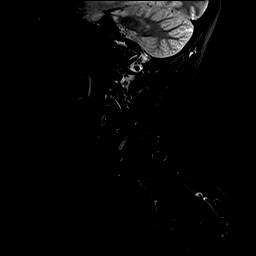
[im 7/19]
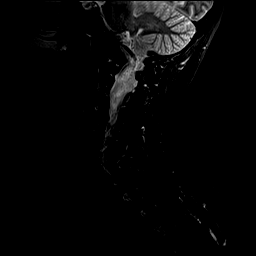
[im 10/19]
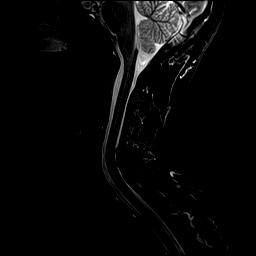
[im 13/19]
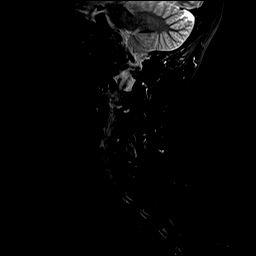
[im 16/19]
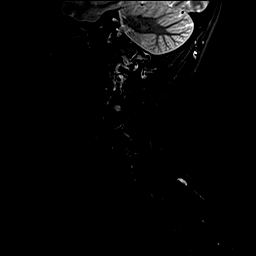
[im 19/19]
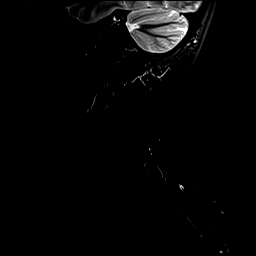

[Series 4: T1 · sagittal · 3.0mm · 0.82mm/px · 7 of 19 slices shown]
[im 1/19]
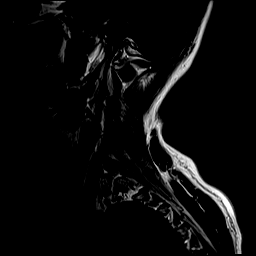
[im 4/19]
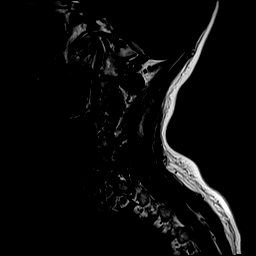
[im 7/19]
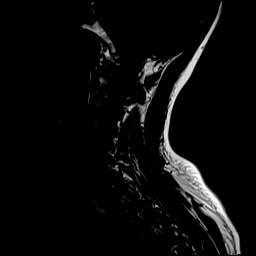
[im 10/19]
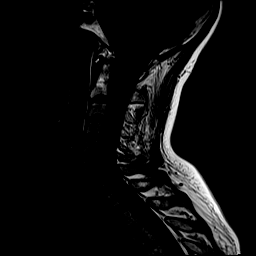
[im 13/19]
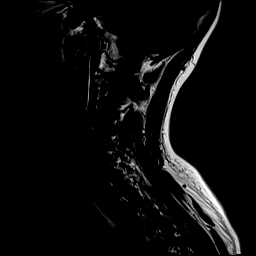
[im 16/19]
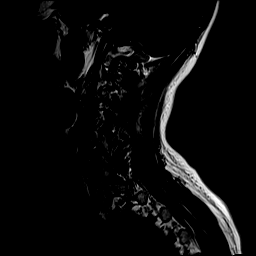
[im 19/19]
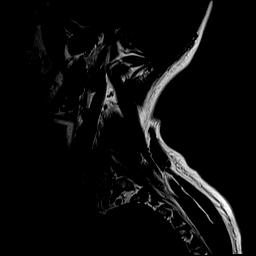

[Series 5: T2 · axial · 3.0mm · 0.78mm/px · z∈[-85,+54]mm · 8 of 37 slices shown (2 of 2)]
[im 1/37]
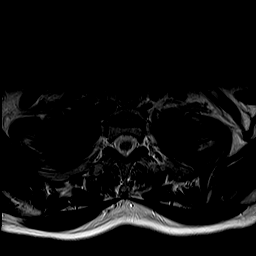
[im 6/37]
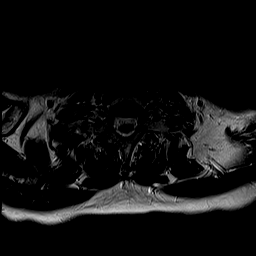
[im 12/37]
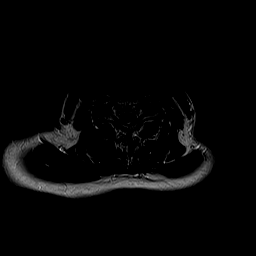
[im 17/37]
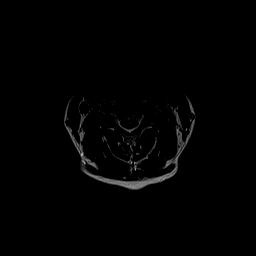
[im 20/37]
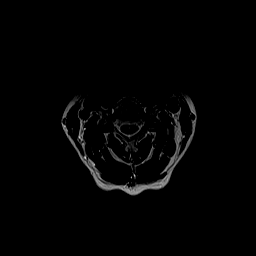
[im 25/37]
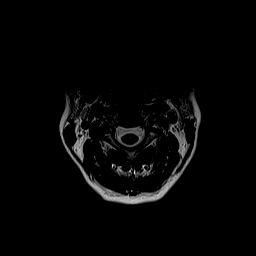
[im 31/37]
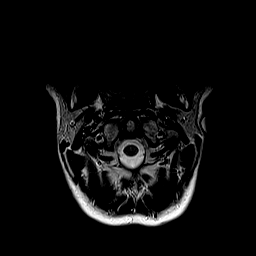
[im 37/37]
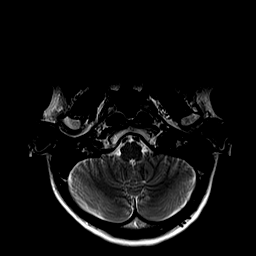

[Series 6: GRE · axial · 3.0mm · 0.39mm/px · z∈[-85,+7]mm · 6 of 37 slices shown]
[im 1/37]
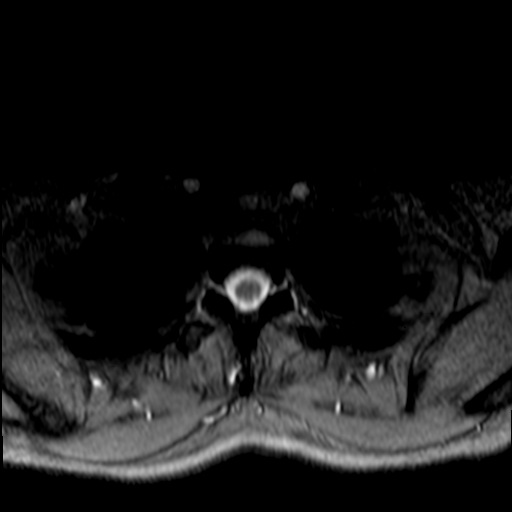
[im 6/37]
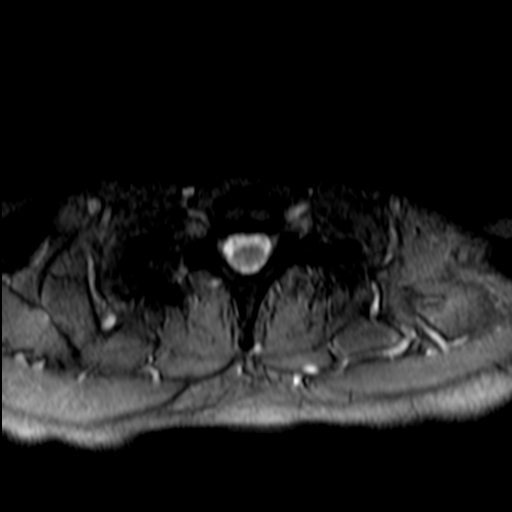
[im 12/37]
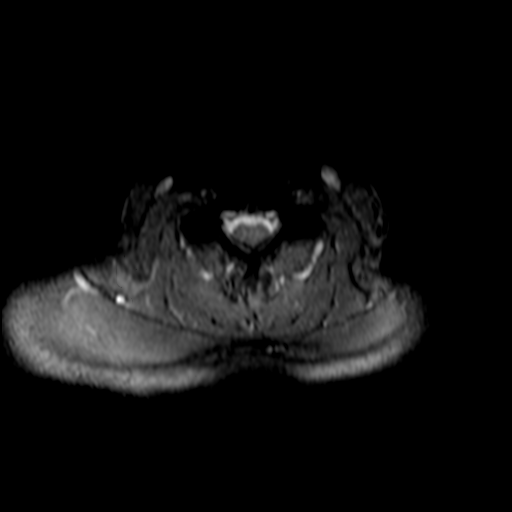
[im 17/37]
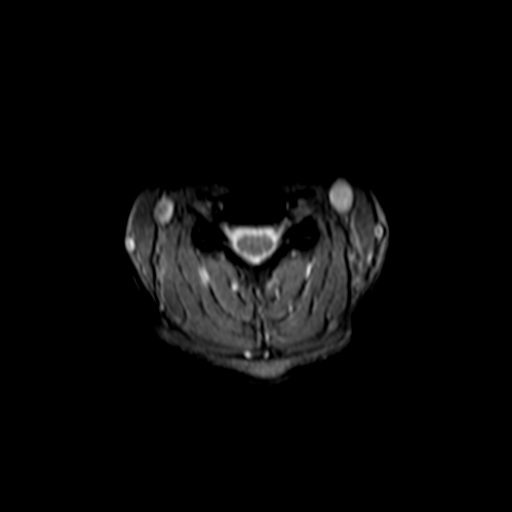
[im 20/37]
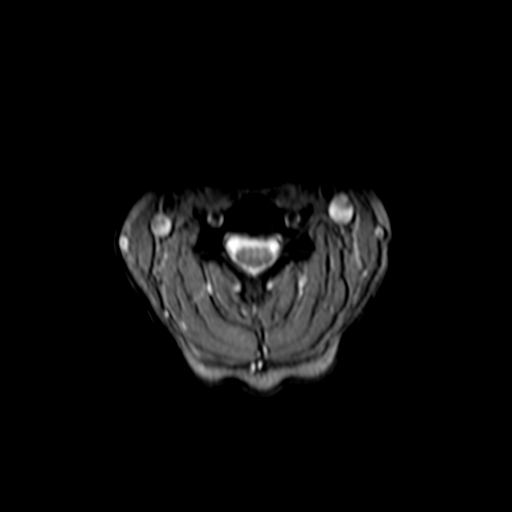
[im 25/37]
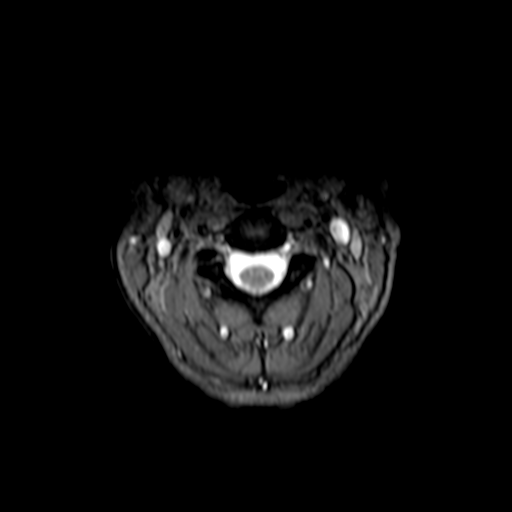

[34 of 48 positions shown; findings below may reference images not displayed]

FINDINGS: Alignment: Mild exaggeration of the normal cervical lordosis. No
listhesis.

Vertebrae: No acute fracture or suspicious osseous lesion.

Cord: Normal signal and morphology.

Posterior Fossa, vertebral arteries, paraspinal tissues: Negative.

Disc levels:

C2-C3: No significant disc bulge. Fluid in the right facets, with
mild widening of the facets. No spinal canal stenosis or neural
foraminal narrowing.

C3-C4: No significant disc bulge. No spinal canal stenosis or neural
foraminal narrowing.

C4-C5: Mild disc bulge. No spinal canal stenosis or neural foraminal
narrowing.

C5-C6: No significant disc bulge. Spinal canal stenosis or neural
foraminal narrowing.

C6-C7: No significant disc bulge. No spinal canal stenosis or neural
foraminal narrowing.

C7-T1: No significant disc bulge. No spinal canal stenosis or neural
foraminal narrowing.
IMPRESSION: 1. No spinal canal stenosis or neural foraminal narrowing.
2. Fluid in the right facets at C2-C3, which can be seen in the
setting of facet joint inflammation.

## 2023-09-05 DIAGNOSIS — F312 Bipolar disorder, current episode manic severe with psychotic features: Secondary | ICD-10-CM | POA: Diagnosis not present

## 2023-09-07 DIAGNOSIS — M62838 Other muscle spasm: Secondary | ICD-10-CM | POA: Diagnosis not present

## 2023-09-07 DIAGNOSIS — F309 Manic episode, unspecified: Secondary | ICD-10-CM | POA: Diagnosis not present

## 2023-09-07 DIAGNOSIS — F312 Bipolar disorder, current episode manic severe with psychotic features: Secondary | ICD-10-CM | POA: Diagnosis not present

## 2023-09-15 ENCOUNTER — Other Ambulatory Visit: Payer: Self-pay | Admitting: Primary Care

## 2023-09-15 DIAGNOSIS — F411 Generalized anxiety disorder: Secondary | ICD-10-CM

## 2023-09-26 DIAGNOSIS — F310 Bipolar disorder, current episode hypomanic: Secondary | ICD-10-CM | POA: Diagnosis not present

## 2023-09-27 DIAGNOSIS — Z5181 Encounter for therapeutic drug level monitoring: Secondary | ICD-10-CM | POA: Diagnosis not present

## 2023-09-27 DIAGNOSIS — F312 Bipolar disorder, current episode manic severe with psychotic features: Secondary | ICD-10-CM | POA: Diagnosis not present

## 2023-10-04 DIAGNOSIS — F3174 Bipolar disorder, in full remission, most recent episode manic: Secondary | ICD-10-CM | POA: Diagnosis not present

## 2023-10-05 ENCOUNTER — Encounter: Payer: Self-pay | Admitting: Primary Care

## 2023-10-05 ENCOUNTER — Ambulatory Visit: Payer: Medicaid Other | Admitting: Primary Care

## 2023-10-05 VITALS — BP 126/78 | HR 79 | Temp 98.8°F | Ht 63.0 in | Wt 123.2 lb

## 2023-10-05 DIAGNOSIS — R232 Flushing: Secondary | ICD-10-CM | POA: Diagnosis not present

## 2023-10-05 DIAGNOSIS — F3113 Bipolar disorder, current episode manic without psychotic features, severe: Secondary | ICD-10-CM

## 2023-10-05 DIAGNOSIS — J3089 Other allergic rhinitis: Secondary | ICD-10-CM | POA: Insufficient documentation

## 2023-10-05 DIAGNOSIS — R3915 Urgency of urination: Secondary | ICD-10-CM

## 2023-10-05 DIAGNOSIS — F319 Bipolar disorder, unspecified: Secondary | ICD-10-CM | POA: Insufficient documentation

## 2023-10-05 DIAGNOSIS — Z91030 Bee allergy status: Secondary | ICD-10-CM | POA: Diagnosis not present

## 2023-10-05 LAB — POC URINALSYSI DIPSTICK (AUTOMATED)
Bilirubin, UA: NEGATIVE
Glucose, UA: NEGATIVE
Ketones, UA: NEGATIVE
Leukocytes, UA: NEGATIVE
Nitrite, UA: NEGATIVE
Protein, UA: NEGATIVE
Spec Grav, UA: 1.01 (ref 1.010–1.025)
Urobilinogen, UA: 0.2 U/dL
pH, UA: 6 (ref 5.0–8.0)

## 2023-10-05 LAB — URINALYSIS, MICROSCOPIC ONLY

## 2023-10-05 MED ORDER — EPINEPHRINE 0.3 MG/0.3ML IJ SOAJ
0.3000 mg | INTRAMUSCULAR | 0 refills | Status: AC | PRN
Start: 2023-10-05 — End: ?

## 2023-10-05 MED ORDER — ALBUTEROL SULFATE HFA 108 (90 BASE) MCG/ACT IN AERS
2.0000 | INHALATION_SPRAY | RESPIRATORY_TRACT | 0 refills | Status: DC | PRN
Start: 2023-10-05 — End: 2024-10-02

## 2023-10-05 NOTE — Assessment & Plan Note (Signed)
UA with trace blood, otherwise negative.  Urine microscopic and urine culture ordered and pending. Await results.

## 2023-10-05 NOTE — Progress Notes (Signed)
Subjective:    Patient ID: Krystal Grant, female    DOB: July 31, 1971, 52 y.o.   MRN: [id number]  HPI  Krystal Grant is a very pleasant 52 y.o. female with a history of hypertension, cannabis induced psychotic disorder with delusions, GAD, hyperlipidemia, bipolar 1 disorder chronic hip pain who presents today to discuss several concerns.  She is also needing refills for albuterol inhaler and Epi Pen.  Over the last 2 weeks she's noticed increased urinary frequency. She denies dysuria, hematuria, vaginal itching and discharge.   Chronic hot flashes with sleep deprivation, nausea. LMP was July 2024, prior menstrual cycle was October 2023.  Since July 2024 she doesn't have menses but once monthly she's experienced symptoms of hot flashes with sleep deprivation, nausea, fatigue.   She is not taking any of her prescribed medications. Evaluated at [facility] on 09/07/23 due to mania, was committed and transferred to a psychiatric facility in [place], [place]. She was trailed on several medications for which she could not tolerate. She is now following with psychiatry, managed on Abilify injections. She is taking buspirone PRN.   She was prescribed Paxil in May 2024 per GYN, she did not take this because she was already taking fluoxetine.    BP Readings from Last 3 Encounters:  10/05/23 126/78  08/16/23 122/80  08/02/23 (!) 146/80     Review of Systems  Constitutional:  Negative for fever.  Gastrointestinal:  Negative for abdominal pain.  Genitourinary:  Positive for frequency, menstrual problem and urgency. Negative for dysuria and vaginal discharge.       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    Social History   Socioeconomic History   Marital status: Married    Spouse name: Not on file   Number of children: 3   Years of education: 12   Highest education level: Not on file  Occupational History   Occupation: [employer]   Tobacco Use   Smoking status: Every Day    Types: E-cigarettes   Smokeless tobacco: Never  Vaping Use   Vaping status: Every Day  Substance and Sexual Activity   Alcohol use: No   Drug use: No   Sexual activity: Not on file  Other Topics Concern   Not on file  Social History Narrative   Lives with husband and kids   Caffeine use: 2 per day   Right handed    Social Determinants of Health   Financial Resource Strain: Not on file  Food Insecurity: Not on file  Transportation Needs: Not on file  Physical Activity: Not on file  Stress: Not on file  Social Connections: Not on file  Intimate Partner Violence: Not on file    Past Surgical History:  Procedure Laterality Date   CARPAL TUNNEL RELEASE  06/28/2017   COLONOSCOPY WITH PROPOFOL N/A 04/13/2023   Procedure: COLONOSCOPY WITH PROPOFOL;  Surgeon: Toney Reil, MD;  Location: [facility];  Service: Endoscopy;  Laterality: N/A;   ELBOW ARTHROSCOPY  06/28/2017   ESOPHAGOGASTRODUODENOSCOPY (EGD) WITH PROPOFOL N/A 04/13/2023   Procedure: ESOPHAGOGASTRODUODENOSCOPY (EGD) WITH PROPOFOL;  Surgeon: Toney Reil, MD;  Location: [facility];  Service: Endoscopy;  Laterality: N/A;   XI ROBOTIC LAPAROSCOPIC ASSISTED APPENDECTOMY N/A 09/18/2021   Procedure: XI ROBOTIC LAPAROSCOPIC ASSISTED APPENDECTOMY;  Surgeon: Sung Amabile,  DO;  Location: [facility];  Service: General;  Laterality: N/A;    Family History  Problem Relation Age of Onset   Migraines Mother    Hypertension Mother    Heart attack Mother 64   Nephrolithiasis Mother    Alcohol abuse Sister    Depression Sister    Arthritis Maternal Grandmother    Hearing loss Maternal Grandmother    Heart attack Maternal Grandfather    Cancer Paternal Grandmother        Breast   Hearing loss Paternal Grandmother    Heart attack Paternal Grandmother    Breast cancer Paternal Grandmother    Heart attack Paternal Grandfather    Asthma Son    Depression Son    Mental illness Son    Learning disabilities Son    Uterine cancer Maternal Aunt     Allergies  Allergen Reactions   Clindamycin Hcl    Cymbalta [Duloxetine Hcl]     disorientation   Iodine Other (See Comments)    Had a vaginal procedure using iodine- her vaginal burned and peeled    Current Outpatient Medications on File Prior to Visit  Medication Sig Dispense Refill   b complex vitamins capsule Take 1 capsule by mouth daily.     busPIRone (BUSPAR) 10 MG tablet Take 2 tablets (20 mg total) by mouth 3 (three) times daily. For anxiety. 540 tablet 2   gabapentin (NEURONTIN) 300 MG capsule Take 300 mg by mouth 3 (three) times daily.     hydrOXYzine (VISTARIL) 25 MG capsule TAKE 1 CAPSULE BY MOUTH 2 TIMES DAILY AS NEEDED FOR ANXIETY. 180 capsule 0   ibuprofen (ADVIL) 800 MG tablet Take 1 tablet (800 mg total) by mouth every 8 (eight) hours as needed for mild pain or moderate pain. 30 tablet 0   mometasone (ELOCON) 0.1 % cream Apply bid to itchy rash at feet prn. Can use up to 4 weeks. 45 g 1   Multiple Vitamins-Minerals (MULTIVITAMIN WITH MINERALS) tablet Take 1 tablet by mouth daily.     mupirocin ointment (BACTROBAN) 2 % Place 1 application into the nose 2 (two) times daily. 22 g 0   ondansetron (ZOFRAN) 4 MG tablet Take 1 tablet (4 mg total) by mouth every 8 (eight) hours as needed for nausea or  vomiting. 10 tablet 0   promethazine (PHENERGAN) 25 MG tablet Take 25 mg by mouth every 6 (six) hours as needed.     triamcinolone cream (KENALOG) 0.1 % Apply topically.     trimethoprim (TRIMPEX) 100 MG tablet Take 100 mg by mouth daily. As needed     omeprazole (PRILOSEC) 40 MG capsule Take 1 capsule (40 mg total) by mouth 2 (two) times daily before a meal. 60 capsule 2   No current facility-administered medications on file prior to visit.    BP 126/78   Pulse 79   Temp 98.8 F (37.1 C) (Oral)   Ht 5\' 3"  (1.6 m)  Wt 123 lb 4 oz (55.9 kg)   SpO2 100%   BMI 21.83 kg/m  Objective:   Physical Exam Cardiovascular:     Rate and Rhythm: Normal rate and regular rhythm.  Pulmonary:     Effort: Pulmonary effort is normal.     Breath sounds: Normal breath sounds.  Musculoskeletal:     Cervical back: Neck supple.  Skin:    General: Skin is warm and dry.  Neurological:     Mental Status: She is alert and oriented to person, place, and time.  Psychiatric:        Mood and Affect: Mood normal.           Assessment & Plan:  Urinary urgency Assessment & Plan: UA with trace blood, otherwise negative.  Urine microscopic and urine culture ordered and pending. Await results.  Orders: -     POCT Urinalysis Dipstick (Automated) -     Urine Microscopic -     Urine Culture  Environmental and seasonal allergies Assessment & Plan: Overall controlled.  Continue albuterol inhaler as needed. Refill provided.  Orders: -     Albuterol Sulfate HFA; Inhale 2 puffs into the lungs every 4 (four) hours as needed for shortness of breath.  Dispense: 1 each; Refill: 0  Allergy to bee sting Assessment & Plan: Refill provided for EpiPen.  Orders: -     EPINEPHrine; Inject 0.3 mg into the muscle as needed for anaphylaxis.  Dispense: 1 each; Refill: 0  Hot flashes Assessment & Plan: Start Paxil 10 mg daily. She will refill this from the pharmacy.  GYN prescribed May 2024.   Bipolar  disorder with severe mania (HCC) Assessment & Plan: Following with psychiatry.  Continue Abilify injections as prescribed.  Continue buspirone 10 mg as needed, hydroxyzine 25 mg as needed.         Doreene Nest, NP

## 2023-10-05 NOTE — Assessment & Plan Note (Signed)
Following with psychiatry.  Continue Abilify injections as prescribed.  Continue buspirone 10 mg as needed, hydroxyzine 25 mg as needed.

## 2023-10-05 NOTE — Assessment & Plan Note (Signed)
Overall controlled.  Continue albuterol inhaler as needed. Refill provided.

## 2023-10-05 NOTE — Patient Instructions (Signed)
Refilled the paroxetine (Paxil) 10 mg tablets for hot flashes.  We will be in touch to receive the urine test results.  It was a pleasure to see you today!

## 2023-10-05 NOTE — Assessment & Plan Note (Signed)
Refill provided for Epi Pen.  

## 2023-10-05 NOTE — Assessment & Plan Note (Signed)
Start Paxil 10 mg daily. She will refill this from the pharmacy.  GYN prescribed May 2024.

## 2023-10-07 LAB — URINE CULTURE
MICRO NUMBER:: 15700239
Result:: NO GROWTH
SPECIMEN QUALITY:: ADEQUATE

## 2023-10-10 DIAGNOSIS — F310 Bipolar disorder, current episode hypomanic: Secondary | ICD-10-CM | POA: Diagnosis not present

## 2023-10-11 DIAGNOSIS — F310 Bipolar disorder, current episode hypomanic: Secondary | ICD-10-CM | POA: Diagnosis not present

## 2023-10-18 DIAGNOSIS — F310 Bipolar disorder, current episode hypomanic: Secondary | ICD-10-CM | POA: Diagnosis not present

## 2023-10-19 ENCOUNTER — Other Ambulatory Visit: Payer: Medicaid Other

## 2023-10-19 ENCOUNTER — Other Ambulatory Visit: Payer: Self-pay | Admitting: *Deleted

## 2023-10-19 ENCOUNTER — Ambulatory Visit: Payer: Medicaid Other | Admitting: Internal Medicine

## 2023-10-19 DIAGNOSIS — D649 Anemia, unspecified: Secondary | ICD-10-CM

## 2023-10-20 ENCOUNTER — Inpatient Hospital Stay (HOSPITAL_BASED_OUTPATIENT_CLINIC_OR_DEPARTMENT_OTHER): Payer: Medicaid Other | Admitting: Internal Medicine

## 2023-10-20 ENCOUNTER — Inpatient Hospital Stay: Payer: Medicaid Other | Attending: Internal Medicine

## 2023-10-20 ENCOUNTER — Other Ambulatory Visit: Payer: Self-pay

## 2023-10-20 VITALS — BP 124/82 | HR 70 | Temp 98.6°F | Wt 128.0 lb

## 2023-10-20 DIAGNOSIS — D509 Iron deficiency anemia, unspecified: Secondary | ICD-10-CM | POA: Diagnosis not present

## 2023-10-20 DIAGNOSIS — M2550 Pain in unspecified joint: Secondary | ICD-10-CM | POA: Insufficient documentation

## 2023-10-20 DIAGNOSIS — D649 Anemia, unspecified: Secondary | ICD-10-CM

## 2023-10-20 DIAGNOSIS — F17290 Nicotine dependence, other tobacco product, uncomplicated: Secondary | ICD-10-CM | POA: Insufficient documentation

## 2023-10-20 LAB — CBC WITH DIFFERENTIAL/PLATELET
Abs Immature Granulocytes: 0.02 10*3/uL (ref 0.00–0.07)
Basophils Absolute: 0.1 10*3/uL (ref 0.0–0.1)
Basophils Relative: 1 %
Eosinophils Absolute: 0.3 10*3/uL (ref 0.0–0.5)
Eosinophils Relative: 5 %
HCT: 35.3 % — ABNORMAL LOW (ref 36.0–46.0)
Hemoglobin: 11.3 g/dL — ABNORMAL LOW (ref 12.0–15.0)
Immature Granulocytes: 0 %
Lymphocytes Relative: 23 %
Lymphs Abs: 1.3 10*3/uL (ref 0.7–4.0)
MCH: 28.5 pg (ref 26.0–34.0)
MCHC: 32 g/dL (ref 30.0–36.0)
MCV: 89.1 fL (ref 80.0–100.0)
Monocytes Absolute: 0.4 10*3/uL (ref 0.1–1.0)
Monocytes Relative: 6 %
Neutro Abs: 3.8 10*3/uL (ref 1.7–7.7)
Neutrophils Relative %: 65 %
Platelets: 375 10*3/uL (ref 150–400)
RBC: 3.96 MIL/uL (ref 3.87–5.11)
RDW: 15.1 % (ref 11.5–15.5)
WBC: 5.9 10*3/uL (ref 4.0–10.5)
nRBC: 0 % (ref 0.0–0.2)

## 2023-10-20 LAB — CMP (CANCER CENTER ONLY)
ALT: 46 U/L — ABNORMAL HIGH (ref 0–44)
AST: 25 U/L (ref 15–41)
Albumin: 4.3 g/dL (ref 3.5–5.0)
Alkaline Phosphatase: 76 U/L (ref 38–126)
Anion gap: 10 (ref 5–15)
BUN: 13 mg/dL (ref 6–20)
CO2: 26 mmol/L (ref 22–32)
Calcium: 9.4 mg/dL (ref 8.9–10.3)
Chloride: 103 mmol/L (ref 98–111)
Creatinine: 1.01 mg/dL — ABNORMAL HIGH (ref 0.44–1.00)
GFR, Estimated: >60 mL/min (ref >60)
Glucose, Bld: 100 mg/dL — ABNORMAL HIGH (ref 70–99)
Potassium: 4.4 mmol/L (ref 3.5–5.1)
Sodium: 139 mmol/L (ref 135–145)
Total Bilirubin: 0.5 mg/dL (ref <1.2)
Total Protein: 7.1 g/dL (ref 6.5–8.1)

## 2023-10-20 LAB — IRON AND TIBC
Iron: 48 ug/dL (ref 28–170)
Saturation Ratios: 11 % (ref 10.4–31.8)
TIBC: 430 ug/dL (ref 250–450)
UIBC: 382 ug/dL

## 2023-10-20 LAB — FERRITIN: Ferritin: 14 ng/mL (ref 11–307)

## 2023-10-20 NOTE — Progress Notes (Signed)
[facility]  Telephone:([phone]) [phone] Fax:(336) [phone]  ID: Krystal Grant OB: 06-Mar-1971  MR#: [id number]  AOZ#:[id number]  Patient Care Team: Doreene Nest, NP as PCP - General (Internal Medicine)   HPI: Krystal Grant is a 52 y.o. female with past medical history of hypertension, COVID-19, depression, fibromyalgia was referred to hematology for workup of anemia and leukopenia.  Patient reports she had COVID 1 year ago.  Since then she never felt back to her normal.  She has been having fatigue and shortness of breath on exertion.  She also developed a rash on her abdomen which eventually cleared but now has on shoulders and on the back.  She was also seen by dermatology and no clear etiology could be determined.  Her last menstrual period was in October 2023.  She has been having hot flashes.  She is also experiencing dull ache in her chest going into the joints and then a hot flash would come over.  She has diffuse joint pain and hyperextensibility of joint.  There is a concern for connective tissue disorder such as Carylon Perches Danlos syndrome  Labs were reviewed.  Hemoglobin 11.3.  WBC 3.8.  Previously 3.5.  Prior to that it was evaluated but in July 2023 had normalized.  Differential is not available.  Platelet was normal.  Smear was reviewed by pathologist which showed mature neutrophils with reactive changes and microcytic hypochromia.  Ferritin 13.  Saturation 14%.  Patient denies any bleeding in stool or urine.  Denies black tarry stool.  Had colonoscopy with Dr. Allegra Lai on 04/13/2023 was normal.  Upper endoscopy showed 1 nonbleeding superficial gastric ulcer with clean ulcer base likely healing on the greater curvature of the stomach.  Pathology pending.  She was started on Prilosec 40 mg twice daily for 3 months.  Was advised repeat endoscopy in 3 months to assess for healing.  Patient has been feeling better and decided to forego it.  S/p IV Venofer 200 mg weekly x 4  doses in February 2024.  Interval history Patient seen today as follow-up for anemia and labs. Continues to have chronic fatigue.  Reports in the month she feels good for a week and very tired for 3 weeks.  Previously had weight loss but now weight gain.  Denies any bleeding in urine or stool.  Decided to forego repeat endoscopy because she has been feeling better and made some changes to the medication she takes.  REVIEW OF SYSTEMS:   ROS  As per HPI. Otherwise, a complete review of systems is negative.  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    PAST SURGICAL HISTORY: Past Surgical History:  Procedure Laterality Date   CARPAL TUNNEL RELEASE  06/28/2017   COLONOSCOPY WITH PROPOFOL N/A 04/13/2023   Procedure: COLONOSCOPY WITH PROPOFOL;  Surgeon: Toney Reil, MD;  Location:  [facility];  Service: Endoscopy;  Laterality: N/A;   ELBOW ARTHROSCOPY  06/28/2017   ESOPHAGOGASTRODUODENOSCOPY (EGD) WITH PROPOFOL N/A 04/13/2023   Procedure: ESOPHAGOGASTRODUODENOSCOPY (EGD) WITH PROPOFOL;  Surgeon: Toney Reil, MD;  Location: [facility];  Service: Endoscopy;  Laterality: N/A;   XI ROBOTIC LAPAROSCOPIC ASSISTED APPENDECTOMY N/A 09/18/2021   Procedure: XI ROBOTIC LAPAROSCOPIC ASSISTED APPENDECTOMY;  Surgeon: Sung Amabile, DO;  Location:  [facility];  Service: General;  Laterality: N/A;    FAMILY HISTORY: Family History  Problem Relation Age of Onset   Migraines Mother    Hypertension Mother    Heart attack Mother 55   Nephrolithiasis Mother    Alcohol abuse Sister    Depression Sister    Arthritis Maternal Grandmother    Hearing loss Maternal Grandmother    Heart attack Maternal Grandfather    Cancer Paternal Grandmother        Breast   Hearing loss Paternal Grandmother    Heart attack Paternal Grandmother    Breast cancer Paternal Grandmother    Heart attack Paternal Grandfather    Asthma Son    Depression Son    Mental illness Son    Learning disabilities Son    Uterine cancer Maternal Aunt     HEALTH MAINTENANCE: Social History   Tobacco Use   Smoking status: Every Day    Types: E-cigarettes   Smokeless tobacco: Never  Vaping Use   Vaping status: Every Day  Substance Use Topics   Alcohol use: No   Drug use: No     Allergies  Allergen Reactions   Clindamycin Hcl    Cymbalta [Duloxetine Hcl]     disorientation   Iodine Other (See Comments)    Had a vaginal procedure using iodine- her vaginal burned and peeled    Current Outpatient Medications  Medication Sig Dispense Refill   albuterol (VENTOLIN HFA) 108 (90 Base) MCG/ACT inhaler Inhale 2 puffs into the lungs every 4 (four) hours as needed for shortness of breath. 1 each 0   b complex vitamins capsule Take 1 capsule by mouth daily.     EPINEPHrine (EPIPEN 2-PAK) 0.3 mg/0.3 mL IJ SOAJ injection Inject 0.3 mg into the muscle as needed for anaphylaxis. 1 each 0   gabapentin (NEURONTIN) 300 MG capsule Take 300 mg by mouth 3 (three) times daily.     ibuprofen (ADVIL) 800 MG tablet Take 1 tablet (800 mg total) by mouth every 8 (eight) hours as needed for mild pain or moderate pain. 30 tablet 0   mometasone (ELOCON) 0.1 % cream Apply bid to itchy rash at feet prn. Can use up to 4 weeks. 45 g 1   Multiple Vitamins-Minerals (MULTIVITAMIN WITH  MINERALS) tablet Take 1 tablet by mouth daily.     mupirocin ointment (BACTROBAN) 2 % Place 1 application into the nose 2 (two) times daily. 22 g 0   ondansetron (ZOFRAN) 4 MG tablet Take 1 tablet (4 mg total) by mouth every 8 (eight) hours as needed for nausea or vomiting. 10 tablet 0   promethazine (PHENERGAN) 25 MG tablet Take 25 mg by mouth every 6 (six) hours as needed.     triamcinolone cream (KENALOG) 0.1 % Apply topically.     trimethoprim (TRIMPEX) 100 MG tablet Take 100 mg by mouth daily. As needed     busPIRone (BUSPAR) 10 MG tablet Take 2 tablets (20 mg total) by mouth 3 (three) times daily. For anxiety. (Patient not taking: Reported on 10/20/2023) 540 tablet  2   hydrOXYzine (VISTARIL) 25 MG capsule TAKE 1 CAPSULE BY MOUTH 2 TIMES DAILY AS NEEDED FOR ANXIETY. (Patient not taking: Reported on 10/20/2023) 180 capsule 0   omeprazole (PRILOSEC) 40 MG capsule Take 1 capsule (40 mg total) by mouth 2 (two) times daily before a meal. 60 capsule 2   No current facility-administered medications for this visit.    OBJECTIVE: Vitals:   10/20/23 1020  BP: 124/82  Pulse: 70  Temp: 98.6 F (37 C)  SpO2: 100%      Body mass index is 22.67 kg/m.      General: Well-developed, well-nourished, no acute distress. Eyes: Pink conjunctiva, anicteric sclera. HEENT: Normocephalic, moist mucous membranes, clear oropharnyx. Lungs: Clear to auscultation bilaterally. Heart: Regular rate and rhythm. No rubs, murmurs, or gallops. Abdomen: Soft, nontender, nondistended. No organomegaly noted, normoactive bowel sounds. Musculoskeletal: No edema, cyanosis, or clubbing. Neuro: Alert, answering all questions appropriately. Cranial nerves grossly intact. Skin: No rashes or petechiae noted. Psych: Normal affect. Lymphatics: No cervical, calvicular, axillary or inguinal LAD.   LAB RESULTS:  Lab Results  Component Value Date   NA 139 10/20/2023   K 4.4 10/20/2023   CL 103 10/20/2023   CO2 26  10/20/2023   GLUCOSE 100 (H) 10/20/2023   BUN 13 10/20/2023   CREATININE 1.01 (H) 10/20/2023   CALCIUM 9.4 10/20/2023   PROT 7.1 10/20/2023   ALBUMIN 4.3 10/20/2023   AST 25 10/20/2023   ALT 46 (H) 10/20/2023   ALKPHOS 76 10/20/2023   BILITOT 0.5 10/20/2023   GFRNONAA >60 10/20/2023   GFRAA >60 01/21/2018    Lab Results  Component Value Date   WBC 5.9 10/20/2023   NEUTROABS 3.8 10/20/2023   HGB 11.3 (L) 10/20/2023   HCT 35.3 (L) 10/20/2023   MCV 89.1 10/20/2023   PLT 375 10/20/2023    Lab Results  Component Value Date   TIBC 430 10/20/2023   TIBC 311 05/01/2023   TIBC 314 04/18/2023   FERRITIN 14 10/20/2023   FERRITIN 54 05/01/2023   FERRITIN 57 04/18/2023   IRONPCTSAT 11 10/20/2023   IRONPCTSAT 15 05/01/2023   IRONPCTSAT 17 04/18/2023     STUDIES: No results found.  ASSESSMENT AND PLAN:   Krystal Grant is a 52 y.o. female with pmh of hypertension, COVID-19, depression, fibromyalgia was referred to hematology for workup of anemia and leukopenia.  #Iron deficiency -Hemoglobin 12.2.  Ferritin 13. S/p IV Venofer 200 mg x 4 doses completed in February 2024.  Cannot tolerate oral iron due to constipation.  -Had colonoscopy with Dr. Allegra Lai on 04/13/2023 was normal.  Upper endoscopy showed 1 nonbleeding superficial gastric ulcer with clean ulcer base likely healing on the greater curvature of the stomach.  Pathology pending.  She was started on Prilosec 40 mg twice daily for 3 months.    -Hemoglobin today is 11.3.  Her hemoglobin tends to fluctuate between 10.9-12.  Iron panel is pending. I will call her if her iron levels are going down if there is the need for iron infusion.  Overall she has been feeling well.  We discussed about continued follow-up with PCP and follow-up with me as needed if anything changes.  # Leukopenia, resolved -Workup was normal - B12 797, folate normal, hepatitis B/C/HIV negative.  ANA negative.  Flow cytometry was normal.  TSH 0.958  #  Chronic fatigue -Of unclear etiology.  TSH level is normal.  Previously iron level was normal.  # Joint pain, hyperextensibility of joint -Is being  worked up by primary for connective tissue disorder such as Carylon Perches Danlos   No orders of the defined types were placed in this encounter.   Patient expressed understanding and was in agreement with this plan. She also understands that She can call clinic at any time with any questions, concerns, or complaints.   I spent a total of 25 minutes reviewing chart data, face-to-face evaluation with the patient, counseling and coordination of care as detailed above.  Michaelyn Barter, MD   10/20/2023 3:21 PM

## 2023-10-20 NOTE — Progress Notes (Signed)
Patient doesn't understand why her energy seems to be ok one day and then the next she says she has no energy at all.

## 2023-11-12 ENCOUNTER — Other Ambulatory Visit: Payer: Self-pay | Admitting: Primary Care

## 2023-11-12 DIAGNOSIS — G479 Sleep disorder, unspecified: Secondary | ICD-10-CM

## 2023-12-07 DIAGNOSIS — G8929 Other chronic pain: Secondary | ICD-10-CM

## 2024-01-09 ENCOUNTER — Encounter: Payer: Self-pay | Admitting: Internal Medicine

## 2024-01-11 ENCOUNTER — Encounter: Payer: Self-pay | Admitting: Primary Care

## 2024-01-11 ENCOUNTER — Ambulatory Visit: Payer: MEDICAID | Admitting: Primary Care

## 2024-01-11 VITALS — BP 104/70 | HR 79 | Temp 97.9°F | Ht 63.0 in | Wt 128.0 lb

## 2024-01-11 DIAGNOSIS — F3173 Bipolar disorder, in partial remission, most recent episode manic: Secondary | ICD-10-CM | POA: Diagnosis not present

## 2024-01-11 DIAGNOSIS — L659 Nonscarring hair loss, unspecified: Secondary | ICD-10-CM

## 2024-01-11 LAB — T4, FREE: Free T4: 0.86 ng/dL (ref 0.60–1.60)

## 2024-01-11 LAB — TSH: TSH: 0.38 u[IU]/mL (ref 0.35–5.50)

## 2024-01-11 NOTE — Progress Notes (Signed)
Subjective:    Patient ID: Krystal Grant, female    DOB: 1971/09/06, 53 y.o.   MRN: [id number]  HPI  Krystal Grant is a very pleasant 53 y.o. female with history of hypertension, cannabis induced psychotic disorder with delusions, GAD, bipolar disorder with severe mania, sleep disturbance, osteoarthritis who presents today to discuss hair loss.  1) Hair Loss/Goiter: She believes she has lost half of her hair since she began taking Lithium in September 2024. She also believes she's developed a goiter. She's noticed tightness to her anterior neck which also occurs when swallowing.   She's noticed patches of bald spots from hair loss. Her ponytail is much thinner.   She began weaning off Lithium and has been off for the last 5 days. Her psychiatrist is aware. She does have a dermatologist, has yet to reach out.   2) Bipolar Disorder: Following with psychiatry at [facility].  Feels well-managed, feels a great connection with her psychiatrist.  She is needing a referral for a therapist as she did not connect well with her therapist at [facility].  Recently weaned off of lithium due to alopecia and goiter.  She is managed on paroxetine 20 mg daily and doing well.  Wt Readings from Last 3 Encounters:  01/11/24 128 lb (58.1 kg)  10/20/23 128 lb (58.1 kg)  10/05/23 123 lb 4 oz (55.9 kg)      Review of Systems  HENT:  Positive for trouble swallowing.   Respiratory:  Negative for shortness of breath.   Cardiovascular:  Negative for chest pain.  Endocrine:       Goiter   Skin:        Hair loss  Psychiatric/Behavioral:  The patient is not nervous/anxious.          Past Medical History:  Diagnosis Date   Acute appendicitis 09/18/2021   Acute non-recurrent sinusitis 06/04/2020   Anxiety    Arthritis    Bilateral lower extremity edema 06/03/2022   Carpal tunnel syndrome on right 03/15/2017   Common migraine with intractable migraine 01/25/2018   Complication of anesthesia    COVID-19 long  hauler 12/23/2020   COVID-19 virus infection 03/24/2022   Depression    E. 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    Social History   Socioeconomic History   Marital status: Married    Spouse name: Not on file   Number of children: 3   Years of education: 12   Highest education level: Not on file  Occupational History   Occupation: [employer]   Tobacco Use   Smoking status: Every Day    Types: E-cigarettes   Smokeless tobacco: Never  Vaping Use   Vaping status: Every Day  Substance and Sexual Activity   Alcohol use: No   Drug use: No   Sexual activity: Not on file  Other Topics Concern   Not on file  Social History Narrative   Lives with husband and kids   Caffeine use: 2 per day   Right handed    Social Drivers of Corporate investment banker Strain: Not on file  Food Insecurity: Not on file  Transportation Needs: Not on file  Physical Activity: Not on file  Stress: Not on file  Social Connections: Not on file  Intimate Partner Violence: Not on file    Past Surgical History:  Procedure Laterality Date   CARPAL TUNNEL RELEASE  06/28/2017   COLONOSCOPY WITH PROPOFOL N/A 04/13/2023   Procedure: COLONOSCOPY WITH PROPOFOL;  Surgeon: Toney Reil, MD;  Location: [facility];  Service: Endoscopy;  Laterality: N/A;   ELBOW ARTHROSCOPY  06/28/2017   ESOPHAGOGASTRODUODENOSCOPY (EGD) WITH PROPOFOL N/A 04/13/2023   Procedure: ESOPHAGOGASTRODUODENOSCOPY (EGD) WITH PROPOFOL;  Surgeon: Toney Reil, MD;  Location: [facility];  Service: Endoscopy;  Laterality: N/A;   XI ROBOTIC LAPAROSCOPIC ASSISTED APPENDECTOMY N/A 09/18/2021   Procedure: XI ROBOTIC LAPAROSCOPIC ASSISTED APPENDECTOMY;  Surgeon: Sung Amabile, DO;  Location: [facility];   Service: General;  Laterality: N/A;    Family History  Problem Relation Age of Onset   Migraines Mother    Hypertension Mother    Heart attack Mother 60   Nephrolithiasis Mother    Alcohol abuse Sister    Depression Sister    Arthritis Maternal Grandmother    Hearing loss Maternal Grandmother    Heart attack Maternal Grandfather    Cancer Paternal Grandmother        Breast   Hearing loss Paternal Grandmother    Heart attack Paternal Grandmother    Breast cancer Paternal Grandmother    Heart attack Paternal Grandfather    Asthma Son    Depression Son    Mental illness Son    Learning disabilities Son    Uterine cancer Maternal Aunt     Allergies  Allergen Reactions   Clindamycin Hcl    Cymbalta [Duloxetine Hcl]     disorientation   Iodine Other (See Comments)    Had a vaginal procedure using iodine- her vaginal burned and peeled    Current Outpatient Medications on File Prior to Visit  Medication Sig Dispense Refill   albuterol (VENTOLIN HFA) 108 (90 Base) MCG/ACT inhaler Inhale 2 puffs into the lungs every 4 (four) hours as needed for shortness of breath. 1 each 0   b complex vitamins capsule Take 1 capsule by mouth daily.     busPIRone (BUSPAR) 10 MG tablet Take 2 tablets (20 mg total) by mouth 3 (three) times daily. For anxiety. 540 tablet 2   EPINEPHrine (EPIPEN 2-PAK) 0.3 mg/0.3 mL IJ SOAJ injection Inject 0.3 mg into the muscle as needed for anaphylaxis. 1 each 0   ibuprofen (ADVIL) 800 MG tablet Take 1 tablet (800 mg total) by mouth every 8 (eight) hours as needed for mild pain or moderate pain. 30 tablet 0   mometasone (ELOCON) 0.1 % cream Apply bid to itchy rash at feet prn. Can use up to 4 weeks. 45 g 1   Multiple Vitamins-Minerals (MULTIVITAMIN WITH MINERALS) tablet Take 1 tablet by mouth daily.     mupirocin ointment (BACTROBAN) 2 % Place 1 application into the nose 2 (two) times daily. 22 g 0   ondansetron (ZOFRAN) 4 MG tablet Take 1 tablet (4 mg total) by  mouth every 8 (eight) hours as needed for nausea or vomiting. 10 tablet 0   PARoxetine (PAXIL) 20 MG tablet Take 20 mg by mouth daily.     promethazine (PHENERGAN) 25 MG tablet Take 25 mg by mouth every 6 (six) hours as needed.     triamcinolone cream (KENALOG) 0.1 % Apply topically.     trimethoprim (TRIMPEX) 100 MG tablet Take 100 mg by mouth daily. As needed     hydrOXYzine (VISTARIL) 25 MG capsule TAKE 1 CAPSULE BY MOUTH  2 TIMES DAILY AS NEEDED FOR ANXIETY. (Patient not taking: Reported on 01/11/2024) 180 capsule 0   omeprazole (PRILOSEC) 40 MG capsule Take 1 capsule (40 mg total) by mouth 2 (two) times daily before a meal. 60 capsule 2   No current facility-administered medications on file prior to visit.    BP 104/70   Pulse 79   Temp 97.9 F (36.6 C) (Temporal)   Ht 5\' 3"  (1.6 m)   Wt 128 lb (58.1 kg)   LMP 02/27/2023 (Approximate)   SpO2 100%   BMI 22.67 kg/m  Objective:   Physical Exam Neck:     Thyroid: Thyromegaly present.     Comments: Mild to moderate enlargement of thyroid without nodules. Cardiovascular:     Rate and Rhythm: Normal rate and regular rhythm.  Pulmonary:     Effort: Pulmonary effort is normal.     Breath sounds: Normal breath sounds.  Musculoskeletal:     Cervical back: Neck supple.  Skin:    General: Skin is warm and dry.  Neurological:     Mental Status: She is alert and oriented to person, place, and time.  Psychiatric:        Mood and Affect: Mood normal.           Assessment & Plan:  Alopecia Assessment & Plan: Checking labs today including TSH, free T4, lithium level.  Remain off lithium. She will set up an appointment with dermatology.  Orders: -     TSH -     T4, free -     Lithium level  Bipolar disorder, in partial remission, most recent episode manic (HCC) Assessment & Plan: Remain off lithium due to side effects of goiter and hair loss.  Continue paroxetine 20 mg daily. Referral placed for therapy.  Orders: -      Ambulatory referral to Psychology -     TSH -     T4, free -     Lithium level        Doreene Nest, NP

## 2024-01-11 NOTE — Patient Instructions (Signed)
Stop by the lab prior to leaving today. I will notify you of your results once received.   You will either be contacted via phone regarding your referral to [email or web address], or you may receive a letter on your MyChart portal from our referral team with instructions for scheduling an appointment. Please let us know if you have not been contacted by anyone within two weeks.  It was a pleasure to see you today!

## 2024-01-11 NOTE — Assessment & Plan Note (Signed)
Remain off lithium due to side effects of goiter and hair loss.  Continue paroxetine 20 mg daily. Referral placed for therapy.

## 2024-01-11 NOTE — Assessment & Plan Note (Signed)
Checking labs today including TSH, free T4, lithium level.  Remain off lithium. She will set up an appointment with dermatology.

## 2024-01-12 LAB — LITHIUM LEVEL: Lithium Lvl: <0.3 mmol/L — ABNORMAL LOW (ref 0.6–1.2)

## 2024-02-12 ENCOUNTER — Encounter: Payer: Self-pay | Admitting: Internal Medicine

## 2024-02-27 DIAGNOSIS — F312 Bipolar disorder, current episode manic severe with psychotic features: Secondary | ICD-10-CM | POA: Diagnosis not present

## 2024-03-19 DIAGNOSIS — F312 Bipolar disorder, current episode manic severe with psychotic features: Secondary | ICD-10-CM | POA: Diagnosis not present

## 2024-04-09 DIAGNOSIS — F312 Bipolar disorder, current episode manic severe with psychotic features: Secondary | ICD-10-CM | POA: Diagnosis not present

## 2024-04-11 ENCOUNTER — Encounter: Payer: Self-pay | Admitting: Internal Medicine

## 2024-04-18 DIAGNOSIS — F312 Bipolar disorder, current episode manic severe with psychotic features: Secondary | ICD-10-CM | POA: Diagnosis not present

## 2024-04-19 ENCOUNTER — Encounter: Payer: Self-pay | Admitting: Advanced Practice Midwife

## 2024-04-19 ENCOUNTER — Ambulatory Visit (INDEPENDENT_AMBULATORY_CARE_PROVIDER_SITE_OTHER): Admitting: Advanced Practice Midwife

## 2024-04-19 VITALS — BP 133/89 | HR 76 | Ht 63.0 in | Wt 139.4 lb

## 2024-04-19 DIAGNOSIS — N951 Menopausal and female climacteric states: Secondary | ICD-10-CM | POA: Diagnosis not present

## 2024-04-19 MED ORDER — MEDROXYPROGESTERONE ACETATE 5 MG PO TABS: 5.0000 mg | ORAL_TABLET | Freq: Every day | ORAL | 11 refills | Status: DC

## 2024-04-19 MED ORDER — ESTRADIOL 1 MG PO TABS: 1.0000 mg | ORAL_TABLET | Freq: Every day | ORAL | 11 refills | Status: DC

## 2024-04-19 NOTE — Patient Instructions (Addendum)
 Menopause: What to Know Menopause is the time in your life when your menstrual periods stop. It marks the end of your ability to get pregnant. It can be defined as not having a period for 12 months without another medical cause. The time when you start to move into menopause is called perimenopause. It often happens between ages 14-55. It can last for many years. During perimenopause, hormone levels change in your body. This can cause symptoms and affect your health. Menopause may make you more likely to have: Bones that are weak and break more easily. Depression. This is when you feel sad or hopeless. Arteries that harden and get narrow. These can cause heart attacks and strokes. What are the causes? In most cases, menopause is a natural change to your body and hormone levels that happens as you get older. But in some cases, it may be caused by changes that aren't natural. These include: Surgery to take out both ovaries. Side effects from some medicines. What increases the risk? You're more likely to go through menopause early if: You have an abnormal growth (tumor) of the pituitary gland in your brain. You have a disease that affects your ovaries. You've had certain treatments for cancer. These include: Chemotherapy. Hormone therapy. Radiation therapy on the area between your hips (pelvis). You smoke a lot or drink a lot of alcohol. Other people in your family have gone through menopause early. You're very thin. What are the signs or symptoms? You may have: Hot flashes. Irregular periods. Night sweats. Changes in how you feel about sex. You may: Have less of a sex drive. Feel more discomfort around your sexuality. Vaginal dryness and thinning of the vaginal walls. This may make it hurt to have sex. Skin changes, such as: Dry skin. New wrinkles. Headaches. Other symptoms may include: Trouble sleeping. Mood swings. Memory problems. Weight gain. Hair growth on your face and  chest. Bladder infections or trouble peeing. How is this diagnosed? You may be diagnosed based on: Your medical history. An exam. Your age. Your history of menstrual periods. Your symptoms. Hormone tests. How is this treated? In some cases, no treatment is needed. Talk with your health care provider about if you should get treated. Treatments may include: Menopausal hormone therapy (MHT). Medicines to treat certain symptoms. Acupuncture. Vitamin or herbal supplements. Before you start treatment, let your provider know if you or anyone in your family has or has had: Heart disease. Breast cancer. Blood clots. Diabetes. Osteoporosis. Follow these instructions at home: Eating and drinking  Eat a balanced diet. It should include: Fresh fruits and vegetables. Whole grains. Lean protein. Low-fat dairy. Eat lots of foods that have calcium and vitamin D  in them. These can help keep your bones healthy. Foods and drinks that are rich in calcium include: Yogurt and low-fat milk. Beans. Almonds. Sardines. Broccoli and kale. To help prevent hot flashes, stay away from: Alcohol. Drinks with caffeine in them. Spicy foods. Lifestyle Do not smoke, vape, or use nicotine or tobacco. Get 7-8 hours of sleep each night. If you have hot flashes, you may want to: Dress in layers. Avoid things that may trigger hot flashes, like warm places or stress. Take slow, deep breaths when a hot flash starts. Keep a fan in your home and office. Find ways to manage stress. You may want to try: Deep breathing. Meditation. Writing in a journal. Ask your provider about going to group therapy. Therapy can help you get support from others who are going  through menopause. General instructions  Talk with your provider before you take any herbal supplements. Keep track of your symptoms. Track: When they start. How often you have them. How long they last. Use vaginal lubricants or moisturizers. These  can help with: Vaginal dryness. Comfort during sex. Contact a health care provider if: You're older than 55 and still get periods. You have pain during sex. You haven't had a period for 12 months and then start to bleed from your vagina. It hurts to pee. You get very bad headaches. Get help right away if: You're very depressed. You have a lot of bleeding from your vagina. Your heart is beating too fast. You have very bad belly pain or indigestion that doesn't go away with medicines. This information is not intended to replace advice given to you by your health care provider. Make sure you discuss any questions you have with your health care provider. Document Revised: 07/20/2023 Document Reviewed: 07/20/2023 Elsevier Patient Education  2024 [employer]Menopause and HRT (Hormone Replacement Therapy): What to Expect Menopause is the time in your life when your menstrual period stops, and your ovaries stop making certain hormones. These hormones are called estrogen and progesterone. Low levels of these hormones can affect your health and cause symptoms. Hormone replacement therapy (HRT) is a treatment that replaces the estrogen and progesterone in your body. Types of HRT  There are many types and doses of HRT. Talk with your health care provider about what form of HRT is best for you. HRT may include: Estrogen and progestin. This is more common if you have a uterus. Estrogen-only therapy. This is used if you don't have a uterus. You may be given the HRT as: Pills. Patches. Gels. Sprays. A vaginal cream. Vaginal rings. Vaginal inserts. How many hormones you need to take and how long you should take them depend on your health. You should: Start HRT at the lowest possible dose. Stop HRT as soon as you're told to. Work with your provider so you feel informed and comfortable with what happens. Tell a health care provider about: Any allergies you have. Whether you've had blood clots  or are at risk for blood clots. Whether you or family members have had cancer, such as cancer of: The breasts. The ovaries. The uterus. Any surgeries you've had. Any medical problems you have. All medicines you take. These include vitamins, herbs, eye drops, and creams. Whether you're pregnant or may be pregnant. What are the benefits? HRT can help with the symptoms of menopause. The benefits depend on: What symptoms you have. How bad your symptoms are. Your overall health. Symptoms of menopause that HRT can help with include: Hot flashes and night sweats. Hot flashes are sudden feelings of heat that spread over your face and body. Your skin may turn red, like a blush. Night sweats are hot flashes that happen when you're sleeping or trying to sleep. Bone loss. The body loses calcium faster after menopause. This can cause your bones to be weaker. They may be more likely to break. Vaginal dryness. The lining of the vagina can get thin and dry, which can cause: Pain during sex. Infection. Burning. Itching. Urinary tract infections. Not being able to control when you pee. Irritability, which means getting annoyed easily. Short-term memory problems. What are the risks? Risks depend on your health and medical history. They also depend on if you get estrogen and progestin or estrogen-only therapy. HRT may make you more likely to have: Spotting. This is  when a small amount of blood leaks from your vagina when you don't expect it to. Endometrial cancer. This is cancer of the lining of the uterus. Breast cancer. Higher density of breast tissue. This can make it harder to find breast cancer on an X-ray. A stroke. Heart disease. Blood clots. Gallbladder or liver disease. You may be more at risk for problems if you have: Endometrial cancer. Liver disease. Heart disease. Breast cancer. A history of blood clots. A history of stroke. Follow these instructions at home: Take your  medicines only as told. Do not smoke, vape, or use nicotine or tobacco. Go to your provider as often as told to get: Mammograms. Pelvic exams. Medical checkups. Contact a health care provider if: Your legs hurt or swell. You feel lumps or changes in your breasts or armpits. You feel pain, burning, or bleeding when you pee. You have bleeding from your vagina that's not normal. You feel dizzy or get headaches. You have pain in your belly. Get help right away if: You have shortness of breath. You have chest pain. You have slurred speech. Any part of your arms or legs feels weak or numb. These symptoms may be an emergency. Call 911 right away. Do not wait to see if the symptoms will go away. Do not drive yourself to the hospital. This information is not intended to replace advice given to you by your health care provider. Make sure you discuss any questions you have with your health care provider. Document Revised: 07/14/2023 Document Reviewed: 07/14/2023 Elsevier Patient Education  2024 [employer].

## 2024-04-22 NOTE — Progress Notes (Signed)
 Patient ID: Krystal Grant, female   DOB: [birth year], 53 y.o.   MRN: [id number]  Reason for Consult: Would like to start Hormone Therapy   Referred by Gabriel John, NP  Subjective:  Date of Service:  HPI:  Krystal Grant is a 53 y.o. female being seen on the advice of her PCP for initiation of HRT. She is postmenopausal for the past year and a half. She has had major emotional changes and has been hospitalized in the past year for the same. Has multiple complaints- especially; hot flashes/night sweats, body aches, nausea, hair falling out, difficulty sleeping, mental health crisis, depression, gained 30 pounds. Anti depressants have offered some relief. She doesn't think Paxil  is working. She is a non-smoker. Reports not being aware of [facility] PAP in 2023 (result message was sent by PCP recommending 1 year f/u.  We discussed the risk/benefits of HRT. Recommend combined estrogen and progesterone therapy (uterus intact).   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   Family History  Problem Relation Age of Onset   Migraines Mother    Hypertension Mother    Heart attack Mother 64   Nephrolithiasis Mother    Alcohol abuse Sister    Depression Sister    Arthritis Maternal Grandmother    Hearing loss Maternal Grandmother    Heart attack Maternal Grandfather    Cancer Paternal  Grandmother        Breast   Hearing loss Paternal Grandmother    Heart attack Paternal Grandmother    Breast cancer Paternal Grandmother    Heart attack Paternal Grandfather    Asthma Son    Depression Son    Mental illness Son    Learning disabilities Son    Uterine cancer Maternal Aunt    Past Surgical History:  Procedure Laterality Date   CARPAL TUNNEL RELEASE  06/28/2017   COLONOSCOPY WITH PROPOFOL  N/A 04/13/2023   Procedure: COLONOSCOPY WITH PROPOFOL ;  Surgeon: Selena Daily, MD;  Location: [facility];  Service: Endoscopy;  Laterality: N/A;   ELBOW ARTHROSCOPY  06/28/2017   ESOPHAGOGASTRODUODENOSCOPY (EGD) WITH PROPOFOL  N/A 04/13/2023   Procedure: ESOPHAGOGASTRODUODENOSCOPY (EGD) WITH PROPOFOL ;  Surgeon: Selena Daily, MD;  Location: [facility];  Service: Endoscopy;  Laterality: N/A;   XI ROBOTIC LAPAROSCOPIC ASSISTED APPENDECTOMY N/A 09/18/2021   Procedure: XI ROBOTIC LAPAROSCOPIC ASSISTED APPENDECTOMY;  Surgeon: Conrado Delay, DO;  Location: [facility];  Service: General;  Laterality: N/A;    Short Social History:  Social History   Tobacco Use   Smoking status: Every Day    Types: E-cigarettes   Smokeless tobacco: Never  Substance Use Topics   Alcohol use: No    Allergies  Allergen Reactions   Clindamycin Hcl    Cymbalta [Duloxetine Hcl]     disorientation   Iodine Other (See Comments)    Had a  vaginal procedure using iodine- her vaginal burned and peeled    Current Outpatient Medications  Medication Sig Dispense Refill   albuterol  (VENTOLIN  HFA) 108 (90 Base) MCG/ACT inhaler Inhale 2 puffs into the lungs every 4 (four) hours as needed for shortness of breath. 1 each 0   b complex vitamins capsule Take 1 capsule by mouth daily.     busPIRone  (BUSPAR ) 10 MG tablet Take 2 tablets (20 mg total) by mouth 3 (three) times daily. For anxiety. 540 tablet 2   EPINEPHrine  (EPIPEN  2-PAK) 0.3 mg/0.3 mL IJ SOAJ injection Inject 0.3 mg into the  muscle as needed for anaphylaxis. 1 each 0   estradiol (ESTRACE) 1 MG tablet Take 1 tablet (1 mg total) by mouth daily. 30 tablet 11   ibuprofen  (ADVIL ) 800 MG tablet Take 1 tablet (800 mg total) by mouth every 8 (eight) hours as needed for mild pain or moderate pain. 30 tablet 0   medroxyPROGESTERone (PROVERA) 5 MG tablet Take 1 tablet (5 mg total) by mouth daily. 30 tablet 11   Multiple Vitamins-Minerals (MULTIVITAMIN WITH MINERALS) tablet Take 1 tablet by mouth daily.     ondansetron  (ZOFRAN ) 4 MG tablet Take 1 tablet (4 mg total) by mouth every 8 (eight) hours as needed for nausea or vomiting. 10 tablet 0   PARoxetine  (PAXIL ) 20 MG tablet Take 20 mg by mouth daily.     promethazine  (PHENERGAN ) 25 MG tablet Take 25 mg by mouth every 6 (six) hours as needed.     trimethoprim  (TRIMPEX ) 100 MG tablet Take 100 mg by mouth daily. As needed     hydrOXYzine  (VISTARIL ) 25 MG capsule TAKE 1 CAPSULE BY MOUTH 2 TIMES DAILY AS NEEDED FOR ANXIETY. (Patient not taking: Reported on 04/19/2024) 180 capsule 0   mometasone  (ELOCON ) 0.1 % cream Apply bid to itchy rash at feet prn. Can use up to 4 weeks. (Patient not taking: Reported on 04/19/2024) 45 g 1   mupirocin  ointment (BACTROBAN ) 2 % Place 1 application into the nose 2 (two) times daily. (Patient not taking: Reported on 04/19/2024) 22 g 0   omeprazole  (PRILOSEC) 40 MG capsule Take 1 capsule (40 mg total) by mouth 2 (two) times daily before a meal. 60 capsule 2   triamcinolone  cream (KENALOG) 0.1 % Apply topically. (Patient not taking: Reported on 04/19/2024)     No current facility-administered medications for this visit.    Review of Systems  Constitutional:  Negative for chills and fever.       Positive for hair falling out, weight gain  HENT:  Negative for congestion, ear discharge, ear pain, hearing loss, sinus pain and sore throat.   Eyes:  Negative for blurred vision and double vision.  Respiratory:  Negative for cough, shortness of breath and  wheezing.   Cardiovascular:  Negative for chest pain, palpitations and leg swelling.  Gastrointestinal:  Positive for nausea. Negative for abdominal pain, blood in stool, constipation, diarrhea, heartburn, melena and vomiting.  Genitourinary:  Negative for dysuria, flank pain, frequency, hematuria and urgency.  Musculoskeletal:  Positive for myalgias. Negative for back pain and joint pain.       Positive body aches especially in armpits, bottom of feet  Skin:  Negative for itching and rash.  Neurological:  Negative for dizziness, tingling, tremors, sensory change, speech change, focal weakness, seizures, loss of consciousness, weakness and headaches.  Endo/Heme/Allergies:  Negative for environmental allergies. Does not bruise/bleed easily.       Positive for hot flashes  Psychiatric/Behavioral:  Positive for depression. Negative for hallucinations, memory loss, substance abuse and suicidal ideas. The patient is not nervous/anxious and does not have insomnia.        Positive for mental health crisis in past year       Objective:  Objective   Vitals:   04/19/24 1611  BP: 133/89  Pulse: 76  Weight: 139 lb 6.4 oz (63.2 kg)  Height: 5\' 3"  (1.6 m)   Body mass index is 24.69 kg/m. Constitutional: Well nourished, well developed female in no acute physical distress.  HEENT: normal Skin: Warm and dry.  Cardiovascular: Regular rate and rhythm.   Extremity: no edema  Respiratory:  Normal respiratory effort Psych: Alert and Oriented x3. No memory deficits. Mildly distressed mood during symptoms interview.    Assessment/Plan:     53 y.o. 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

## 2024-04-25 DIAGNOSIS — M5416 Radiculopathy, lumbar region: Secondary | ICD-10-CM | POA: Diagnosis not present

## 2024-04-26 DIAGNOSIS — M5416 Radiculopathy, lumbar region: Secondary | ICD-10-CM | POA: Diagnosis not present

## 2024-05-01 ENCOUNTER — Ambulatory Visit (INDEPENDENT_AMBULATORY_CARE_PROVIDER_SITE_OTHER): Admitting: Advanced Practice Midwife

## 2024-05-01 ENCOUNTER — Other Ambulatory Visit (HOSPITAL_COMMUNITY)
Admission: RE | Admit: 2024-05-01 | Discharge: 2024-05-01 | Disposition: A | Discharge status: Home / Self Care | Source: Ambulatory Visit | Attending: Advanced Practice Midwife | Admitting: Advanced Practice Midwife

## 2024-05-01 ENCOUNTER — Encounter: Payer: Self-pay | Admitting: Advanced Practice Midwife

## 2024-05-01 VITALS — BP 131/85 | HR 61 | Ht 63.0 in | Wt 141.2 lb

## 2024-05-01 DIAGNOSIS — Z0000 Encounter for general adult medical examination without abnormal findings: Secondary | ICD-10-CM | POA: Insufficient documentation

## 2024-05-01 DIAGNOSIS — Z01419 Encounter for gynecological examination (general) (routine) without abnormal findings: Secondary | ICD-10-CM

## 2024-05-01 DIAGNOSIS — Z1239 Encounter for other screening for malignant neoplasm of breast: Secondary | ICD-10-CM

## 2024-05-01 DIAGNOSIS — Z124 Encounter for screening for malignant neoplasm of cervix: Secondary | ICD-10-CM

## 2024-05-01 NOTE — Progress Notes (Signed)
 [facility]  Gynecology Annual Exam  PCP: Gabriel John, NP  Chief Complaint:  Chief Complaint  Patient presents with   Annual Exam    Needs pap today    History of Present Illness:Patient is a 53 y.o. G3P3003 presents for annual exam. The patient reports she is having a "rough day"- especially with regards to vertigo and nausea. This has been associated with her menopausal symptoms. She does report slight improvement in frequency of hot flashes since initiating HRT recently. Also she has been sleeping better.   LMP: Patient's last menstrual period was 02/27/2023 (approximate). She is postmenopausal.  Postcoital Bleeding: no Dysmenorrhea: not applicable  The patient is rarely sexually active. She reports mild dyspareunia.  The patient does perform self breast exams.  There is no notable family history of breast or ovarian cancer in her family.  The patient wears seatbelts: yes.   The patient has regular exercise: she has a generally active lifestyle, has a [employer] membership, likes to spend time outdoors, has a healthy diet and adequate hydration.    The patient denies current symptoms of depression.     Review of Systems:  Review of Systems  Constitutional:  Negative for chills and fever.  HENT:  Negative for congestion, ear discharge, ear pain, hearing loss, sinus pain and sore throat.   Eyes:  Negative for blurred vision and double vision.  Respiratory:  Negative for cough, shortness of breath and wheezing.   Cardiovascular:  Negative for chest pain, palpitations and leg swelling.  Gastrointestinal:  Positive for nausea. Negative for abdominal pain, blood in stool, constipation, diarrhea, heartburn, melena and vomiting.  Genitourinary:  Negative for dysuria, flank pain, frequency, hematuria and urgency.  Musculoskeletal:  Negative for back pain, joint pain and myalgias.  Skin:  Negative for itching and rash.  Neurological:  Positive for dizziness. Negative for tingling,  tremors, sensory change, speech change, focal weakness, seizures, loss of consciousness, weakness and headaches.  Endo/Heme/Allergies:  Negative for environmental allergies. Does not bruise/bleed easily.       Positive for hot flashes  Psychiatric/Behavioral:  Negative for depression, hallucinations, memory loss, substance abuse and suicidal ideas. The patient is not nervous/anxious and does not have insomnia.      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    Fatigue 04/18/2023    Gastric ulcer 04/13/2023    Osteoarthritis of hip 04/04/2023    Chronic hip pain, bilateral 03/23/2023    Anemia 12/08/2022    Dyslipidemia 07/27/2022    Chest pain 07/06/2022    Chronic fatigue 06/03/2022    Restless legs 06/03/2022    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    Chronic back pain 06/24/2021    Encounter for annual general medical examination with abnormal findings in adult 01/15/2019    Hypertension 01/02/2019    Vertigo 06/20/2018    GAD (generalized anxiety disorder) 06/20/2018    Arthritis 06/20/2018     Past Surgical History:  Past Surgical History:  Procedure Laterality Date   CARPAL TUNNEL RELEASE  06/28/2017   COLONOSCOPY WITH  PROPOFOL  N/A 04/13/2023   Procedure: COLONOSCOPY WITH PROPOFOL ;  Surgeon: Selena Daily, MD;  Location: [facility];  Service: Endoscopy;  Laterality: N/A;   ELBOW ARTHROSCOPY  06/28/2017   ESOPHAGOGASTRODUODENOSCOPY (EGD) WITH PROPOFOL  N/A 04/13/2023   Procedure: ESOPHAGOGASTRODUODENOSCOPY (EGD) WITH PROPOFOL ;  Surgeon: Selena Daily, MD;  Location: [facility];  Service: Endoscopy;  Laterality: N/A;   XI ROBOTIC LAPAROSCOPIC ASSISTED APPENDECTOMY N/A 09/18/2021   Procedure: XI ROBOTIC LAPAROSCOPIC ASSISTED APPENDECTOMY;  Surgeon: Conrado Delay, DO;  Location: [facility];  Service: General;  Laterality: N/A;    Gynecologic History:  Patient's last menstrual period was 02/27/2023 (approximate). Last Pap: 06/03/22 Results were: low-grade squamous intraepithelial neoplasia (LGSIL - encompassing HPV,mild dysplasia,CIN I)  Last mammogram: 06/17/22 Results were: BI-RAD II benign  Obstetric History: [id number]  Family History:  Family History  Problem Relation Age of Onset   Migraines Mother    Hypertension Mother    Heart attack Mother 74   Nephrolithiasis Mother    Alcohol abuse Sister    Depression Sister    Arthritis Maternal Grandmother    Hearing loss Maternal Grandmother    Heart attack Maternal Grandfather    Cancer Paternal Grandmother        Breast   Hearing loss Paternal Grandmother    Heart attack Paternal Grandmother    Breast cancer Paternal Grandmother    Heart attack Paternal Grandfather    Asthma Son    Depression Son    Mental illness Son    Learning disabilities Son    Uterine cancer Maternal Aunt     Social History:  Social History   Socioeconomic History   Marital status: Married    Spouse name: Not on file   Number of children: 3   Years of education: 12   Highest education level: Not on file  Occupational History   Occupation: [employer]   Tobacco Use   Smoking status: Former    Types: E-cigarettes    Quit date: 2019    Years since quitting: 6.4   Smokeless tobacco: Never  Vaping Use   Vaping status: Every Day   Devices: non-nicotine  Substance and Sexual Activity   Alcohol use: No   Drug use: No   Sexual activity: Not on file  Other Topics Concern   Not on file  Social History Narrative   Lives with husband and kids   Caffeine use: 2 per day   Right handed     Social Drivers of Corporate investment banker Strain: Not on file  Food Insecurity: Not on file  Transportation Needs: Not on file  Physical Activity: Not on file  Stress: Not on file  Social Connections: Not on file  Intimate Partner Violence: Not on file    Allergies:  Allergies  Allergen Reactions   Clindamycin Hcl    Cymbalta [Duloxetine Hcl]     disorientation   Iodine Other (See Comments)    Had a vaginal procedure using iodine- her vaginal burned and peeled    Medications: Prior to Admission medications   Medication Sig Start Date End Date Taking? Authorizing Provider  albuterol  (VENTOLIN  HFA) 108 (90 Base) MCG/ACT inhaler Inhale 2 puffs into the lungs every 4 (four) hours as needed for shortness of breath. 10/05/23  Yes Clark, Katherine K, NP  b complex vitamins capsule Take 1 capsule by mouth daily.   Yes [provider]  busPIRone  (BUSPAR ) 10 MG tablet Take 2 tablets (20 mg total) by  mouth 3 (three) times daily. For anxiety. 06/21/23  Yes Clark, Katherine K, NP  EPINEPHrine  (EPIPEN  2-PAK) 0.3 mg/0.3 mL IJ SOAJ injection Inject 0.3 mg into the muscle as needed for anaphylaxis. 10/05/23  Yes Clark, Katherine K, NP  estradiol  (ESTRACE ) 1 MG tablet Take 1 tablet (1 mg total) by mouth daily. 04/19/24  Yes Angelita Kendall, CNM  ibuprofen  (ADVIL ) 800 MG tablet Take 1 tablet (800 mg total) by mouth every 8 (eight) hours as needed for mild pain or moderate pain. 09/19/21  Yes Sakai, Isami, DO  medroxyPROGESTERone  (PROVERA ) 5 MG tablet Take 1 tablet (5 mg total) by mouth daily. 04/19/24  Yes Angelita Kendall, CNM  Multiple Vitamins-Minerals (MULTIVITAMIN WITH MINERALS) tablet Take 1 tablet by mouth daily.   Yes [provider]  ondansetron  (ZOFRAN ) 4 MG tablet Take 1 tablet (4 mg total) by mouth every 8 (eight) hours as needed for nausea or vomiting. 04/04/23  Yes Vanga, Elson Halon, MD  PARoxetine  (PAXIL ) 20 MG tablet Take 20 mg by mouth daily.   Yes [provider]  promethazine  (PHENERGAN ) 25 MG tablet Take 25 mg by mouth every 6 (six) hours as needed. 08/09/23  Yes [provider]  trimethoprim  (TRIMPEX ) 100 MG tablet Take 100 mg by mouth daily. As needed 04/27/21  Yes [provider]  omeprazole  (PRILOSEC) 40 MG capsule Take 1 capsule (40 mg total) by mouth 2 (two) times daily before a meal. 04/13/23 07/12/23  Vanga, Elson Halon, MD  triamcinolone  cream (KENALOG) 0.1 % Apply topically. Patient not taking: Reported on 05/01/2024 09/12/22   [provider]    Physical Exam Vitals: Blood pressure 131/85, pulse 61, height 5\' 3"  (1.6 m), weight 141 lb 3.2 oz (64 kg).  General: NAD HEENT: normocephalic, anicteric Thyroid : no enlargement, no palpable nodules Pulmonary: No increased work of breathing, CTAB Cardiovascular: RRR, distal pulses 2+ Breast: Breast symmetrical, no tenderness, no palpable nodules or masses, no skin or nipple retraction present, no nipple discharge.  No axillary or supraclavicular lymphadenopathy. Abdomen: NABS, soft, non-tender, non-distended.  Umbilicus without lesions.  No hepatomegaly, splenomegaly or masses palpable. No evidence of hernia  Genitourinary:  External: Normal external female genitalia.  Normal urethral meatus, normal Bartholin's and Skene's glands.    Vagina: Normal vaginal mucosa, no evidence of prolapse.    Cervix: Grossly normal in appearance, no bleeding  Uterus: Non-enlarged, mobile, normal contour.  No CMT  Adnexa: ovaries non-enlarged, no adnexal masses  Rectal: deferred  Lymphatic: no evidence of inguinal lymphadenopathy Extremities: no edema, erythema, or tenderness Neurologic: Grossly intact Psychiatric: mood appropriate, affect full    Assessment: 53 y.o. G3P3003 routine annual exam  Plan: Problem List Items Addressed This Visit   None Visit Diagnoses       Well woman exam with routine gynecological exam    -  Primary   Relevant Orders   Cytology - PAP      Screening for cervical cancer       Relevant Orders   Cytology - PAP       1) Mammogram - recommend yearly screening mammogram.  Mammogram Was ordered today  2) STI screening  was offered and declined  3) ASCCP guidelines and rationale discussed.  Patient opts for every 5 years screening interval following normal PAP  4) Osteoporosis  - per USPTF routine screening DEXA at age 78  Consider FDA-approved medical therapies in postmenopausal women and men aged 2 years and older, based on the following: a) A hip or  vertebral (clinical or morphometric) fracture b) T-score <= -2.5 at the femoral neck or spine after appropriate evaluation to exclude secondary causes C) Low bone mass (T-score between -1.0 and -2.5 at the femoral neck or spine) and a 10-year probability of a hip fracture >= 3% or a 10-year probability of a major osteoporosis-related fracture >= 20% based on the [place] -adapted WHO algorithm   5) Routine healthcare maintenance including cholesterol, diabetes screening discussed Declines  6) Colonoscopy : done on 04/13/23.  Screening recommended starting at age 54 for average risk individuals, age 28 for individuals deemed at increased risk (including African Americans) and recommended to continue until age 34.  For patient age 71-85 individualized approach is recommended.  Gold standard screening is via colonoscopy, Cologuard screening is an acceptable alternative for patient unwilling or unable to undergo colonoscopy.  "Colorectal cancer screening for average?risk adults: 2018 guideline update from the American Cancer Society"CA: A Cancer Journal for Clinicians: Apr 26, 2017   7) Return in about 1 year (around 05/01/2025) for annual established gyn.    Angelita Kendall, CNM [facility] 05/01/2024 6:51 PM

## 2024-05-01 NOTE — Patient Instructions (Addendum)
 Consider Motherwort herbal supplement for hot flashes Decrease exposure to plastics- hormone disrupting

## 2024-05-07 LAB — CYTOLOGY - PAP
Comment: NEGATIVE
Diagnosis: NEGATIVE
High risk HPV: NEGATIVE

## 2024-05-11 ENCOUNTER — Other Ambulatory Visit: Payer: Self-pay | Admitting: Advanced Practice Midwife

## 2024-05-11 DIAGNOSIS — N951 Menopausal and female climacteric states: Secondary | ICD-10-CM

## 2024-05-13 ENCOUNTER — Ambulatory Visit: Payer: Self-pay | Admitting: Advanced Practice Midwife

## 2024-05-20 ENCOUNTER — Telehealth: Payer: Self-pay

## 2024-05-20 ENCOUNTER — Ambulatory Visit: Admitting: Obstetrics & Gynecology

## 2024-05-20 VITALS — BP 136/82 | HR 60 | Ht 63.0 in | Wt 136.2 lb

## 2024-05-20 DIAGNOSIS — Z0000 Encounter for general adult medical examination without abnormal findings: Secondary | ICD-10-CM

## 2024-05-20 DIAGNOSIS — Z131 Encounter for screening for diabetes mellitus: Secondary | ICD-10-CM | POA: Diagnosis not present

## 2024-05-20 DIAGNOSIS — N951 Menopausal and female climacteric states: Secondary | ICD-10-CM

## 2024-05-20 DIAGNOSIS — Z13220 Encounter for screening for lipoid disorders: Secondary | ICD-10-CM | POA: Diagnosis not present

## 2024-05-20 DIAGNOSIS — Z79890 Hormone replacement therapy (postmenopausal): Secondary | ICD-10-CM | POA: Diagnosis not present

## 2024-05-20 MED ORDER — ESTRADIOL-LEVONORGESTREL 0.045-0.015 MG/DAY TD PTWK: 1.0000 | MEDICATED_PATCH | TRANSDERMAL | 5 refills | Status: DC

## 2024-05-20 MED ORDER — PROMETHAZINE HCL 25 MG PO TABS: 25.0000 mg | ORAL_TABLET | Freq: Four times a day (QID) | ORAL | 2 refills | Status: DC | PRN

## 2024-05-20 NOTE — Telephone Encounter (Signed)
 Pt calling triage stating her medroxyPROGESTERone  (PROVERA ) 5 MG tablet  is making her sick. Nausea and vomiting. Asking if she can be seen to discuss sx or if patch or cream Rx can be called in? Dr. Starla has agreed to see her this PM.

## 2024-05-20 NOTE — Progress Notes (Signed)
    GYNECOLOGY PROGRESS NOTE  Subjective:    Patient ID: Krystal Grant, female    DOB: [birth year], 53 y.o.   MRN: [id number]  HPI  Patient is a 53 y.o. married G3P3003 (12, 53, and 52 yo kids)  here to discuss her HRT. She was started on estrace  1 mg daily along with provera  5 mg daily 04/19/2024. She reports nausea associated with the progesterone and is interested in different HRT management.  The following portions of the patient's history were reviewed and updated as appropriate: allergies, current medications, past family history, past medical history, past social history, past surgical history, and problem list.  Review of Systems Pertinent items are noted in HPI.  Normal pap smear 04/2024  Objective:   Height 5' 3 (1.6 m), weight 136 lb 3.2 oz (61.8 kg), last menstrual period 02/27/2023. Body mass index is 24.13 kg/m. Well nourished, well hydrated White female, no apparent distress She is ambulating and conversing normally.    Assessment:  HRT- change to climara /pro  Plan:   Phenergan  25 mg prn nausea She is aware that she man need a pre auth for her insurance.

## 2024-05-21 LAB — HEMOGLOBIN A1C
Est. average glucose Bld gHb Est-mCnc: 105 mg/dL
Hgb A1c MFr Bld: 5.3 % (ref 4.8–5.6)

## 2024-05-21 LAB — LIPID PANEL
Chol/HDL Ratio: 3.6 ratio (ref 0.0–4.4)
Cholesterol, Total: 248 mg/dL — ABNORMAL HIGH (ref 100–199)
HDL: 68 mg/dL (ref >39)
LDL Chol Calc (NIH): 151 mg/dL — ABNORMAL HIGH (ref 0–99)
Triglycerides: 164 mg/dL — ABNORMAL HIGH (ref 0–149)
VLDL Cholesterol Cal: 29 mg/dL (ref 5–40)

## 2024-05-22 ENCOUNTER — Encounter: Payer: Self-pay | Admitting: Obstetrics & Gynecology

## 2024-06-03 ENCOUNTER — Ambulatory Visit: Payer: Self-pay

## 2024-06-03 NOTE — Telephone Encounter (Signed)
 Patient is scheduled for tomorrow.

## 2024-06-03 NOTE — Telephone Encounter (Signed)
 Noted, will evaluate.

## 2024-06-03 NOTE — Telephone Encounter (Signed)
 FYI Only or Action Required?: FYI only for provider.  Patient was last seen in primary care on 01/11/2024 by Gretta Comer POUR, NP. Called Nurse Triage reporting Nausea. Symptoms began 03/2024. Interventions attempted: Nothing. Symptoms are: gradually worsening.  Triage Disposition: See Physician Within 24 Hours  Patient/caregiver understands and will follow disposition?: Yes     Copied from CRM [id number]. Topic: Clinical - Red Word Triage >> Jun 03, 2024 11:18 AM Krystal Grant wrote: Red Word that prompted transfer to Nurse Triage: Patient has been experiencing nausea, vomiting, dizziness for a couple of weeks now, a lot hot flashes during the night. Reason for Disposition  [1] MILD or MODERATE vomiting AND [2] present > 48 hours (2 days) (Exception: Mild vomiting with associated diarrhea.)  Answer Assessment - Initial Assessment Questions 1. VOMITING SEVERITY: How many times have you vomited in the past 24 hours?     - MILD:  1 - 2 times/day    - MODERATE: 3 - 5 times/day, decreased oral intake without significant weight loss or symptoms of dehydration    - SEVERE: 6 or more times/day, vomits everything or nearly everything, with significant weight loss, symptoms of dehydration      severe 2. ONSET: When did the vomiting begin?      03/2024 3. FLUIDS: What fluids or food have you vomited up today? Have you been able to keep any fluids down?     Water , tea, soda 4. ABDOMEN PAIN: Are your having any abdomen pain? If Yes : How bad is it and what does it feel like? (e.g., crampy, dull, intermittent, constant)      No - abd soreness 5. DIARRHEA: Is there any diarrhea? If Yes, ask: How many times today?      Comes and goes 6. CONTACTS: Is there anyone else in the family with the same symptoms?      na 7. CAUSE: What do you think is causing your vomiting?     unknown 8. HYDRATION STATUS: Any signs of dehydration? (e.g., dry mouth [not only dry lips], too weak to stand) When  did you last urinate?     no 9. OTHER SYMPTOMS: Do you have any other symptoms? (e.g., fever, headache, vertigo, vomiting blood or coffee grounds, recent head injury)    N/v, hot & cold flashes, vertigo 10. PREGNANCY: Is there any chance you are pregnant? When was your last menstrual period?       N/a  Started HRT in 03/2024 shortly after n/v started daily and still continues  Protocols used: Vomiting-A-AH

## 2024-06-04 ENCOUNTER — Ambulatory Visit (INDEPENDENT_AMBULATORY_CARE_PROVIDER_SITE_OTHER): Admitting: Primary Care

## 2024-06-04 ENCOUNTER — Encounter: Payer: Self-pay | Admitting: Primary Care

## 2024-06-04 VITALS — BP 148/82 | HR 72 | Temp 97.2°F | Ht 63.0 in | Wt 137.0 lb

## 2024-06-04 DIAGNOSIS — R232 Flushing: Secondary | ICD-10-CM | POA: Diagnosis not present

## 2024-06-04 DIAGNOSIS — R112 Nausea with vomiting, unspecified: Secondary | ICD-10-CM | POA: Insufficient documentation

## 2024-06-04 DIAGNOSIS — H6123 Impacted cerumen, bilateral: Secondary | ICD-10-CM | POA: Diagnosis not present

## 2024-06-04 DIAGNOSIS — H6120 Impacted cerumen, unspecified ear: Secondary | ICD-10-CM | POA: Insufficient documentation

## 2024-06-04 NOTE — Progress Notes (Signed)
 Subjective:    Patient ID: Krystal Grant, female    DOB: December 28, 1970, 53 y.o.   MRN: [id number]  HPI  Krystal Grant is a very pleasant 53 y.o. female with a history of hot flashes, hypertension, cannabis induced psychotic disorder with delusions, chronic joint pain, GAD, chronic fatigue who presents today to discuss nausea, vomiting, dizziness, hot flashes.  She has also noticed ear fullness bilaterally.  1) Nausea/Vomiting/Hot Flashes: Symptom onset 2 months ago since initiation on HRT per GYN.   She follows with GYN, last office visit was on May 20, 2024.  During this visit she endorsed nausea since initiation on Provera  5 mg daily and Estrace  1 mg daily on May 01, 2024.  On May 20, 2024 her prescription was changed to Climarapro 0.045-0.015 mcg patches daily and she was prescribed oral promethazine .   Today she discusses daily symptoms of lightlessness, nausea and vomiting episodes. Symptoms begin in the morning when she rolls over in bed. She then feels nauseated and vomits up stomach contents and mucous. This lasts for 2-3 hours. During the day she experiences lightheadedness intermittently. Her hot flashes during the night have improved except she develops hot flashes in the morning. She has continued using her patches.   She denies new supplements or medications except for HRT. She had labs drawn per GYN in June 2025, LDL was 151. Last LDL was 140 in 2023 and 136 in 2022 and 137 in 2021. She has a family history of hyperlipidemia and heart attack in her mother. She underwent coronary CT scan in 2023 with a calcium score of 0.  She has yet to contact her GYN regarding her ongoing symptoms.  She is unable to take the Phenergan  as she vomits it back up.  She cannot stand orally disintegrating Zofran .  2) Ear Fullness: Acute for the last few weeks. She denies pain but has felt lightheaded each morning for the last 2 months which she attributes to HRT per GYN.   BP Readings from Last 3  Encounters:  06/04/24 (!) 148/82  05/20/24 136/82  05/01/24 131/85     Review of Systems  Constitutional:  Negative for fever.  HENT:  Positive for congestion.        Bilateral ear fullness  Gastrointestinal:  Positive for nausea and vomiting. Negative for abdominal pain, constipation and diarrhea.  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    Social History   Socioeconomic History   Marital status: Married    Spouse name: Not on file   Number of children: 3   Years of education: 12   Highest education level: Not on file  Occupational History   Occupation: [employer]   Tobacco Use   Smoking status: Former  Types: E-cigarettes    Quit date: 2019    Years since quitting: 6.5   Smokeless tobacco: Never  Vaping Use   Vaping status: Every Day   Devices: non-nicotine  Substance and Sexual Activity   Alcohol use: No   Drug use: No   Sexual activity: Not on file  Other Topics Concern   Not on file  Social History Narrative   Lives with husband and kids   Caffeine use: 2 per day   Right handed    Social Drivers of Health   Financial Resource Strain: Not on file  Food Insecurity: Not on file  Transportation Needs: Not on file  Physical Activity: Not on file  Stress: Not on file  Social Connections: Not on file   Intimate Partner Violence: Not on file    Past Surgical History:  Procedure Laterality Date   CARPAL TUNNEL RELEASE  06/28/2017   COLONOSCOPY WITH PROPOFOL  N/A 04/13/2023   Procedure: COLONOSCOPY WITH PROPOFOL ;  Surgeon: Unk Corinn Skiff, MD;  Location: [facility];  Service: Endoscopy;  Laterality: N/A;   ELBOW ARTHROSCOPY  06/28/2017   ESOPHAGOGASTRODUODENOSCOPY (EGD) WITH PROPOFOL  N/A 04/13/2023   Procedure: ESOPHAGOGASTRODUODENOSCOPY (EGD) WITH PROPOFOL ;  Surgeon: Unk Corinn Skiff, MD;  Location: [facility];  Service: Endoscopy;  Laterality: N/A;   XI ROBOTIC LAPAROSCOPIC ASSISTED APPENDECTOMY N/A 09/18/2021   Procedure: XI ROBOTIC LAPAROSCOPIC ASSISTED APPENDECTOMY;  Surgeon: Tye Millet, DO;  Location: [facility];  Service: General;  Laterality: N/A;    Family History  Problem Relation Age of Onset   Migraines Mother    Hypertension Mother    Heart attack Mother 52   Nephrolithiasis Mother    Alcohol abuse Sister    Depression Sister    Arthritis Maternal Grandmother    Hearing loss Maternal Grandmother    Heart attack Maternal Grandfather    Cancer Paternal Grandmother        Breast   Hearing loss Paternal Grandmother    Heart attack Paternal Grandmother    Breast cancer Paternal Grandmother    Heart attack Paternal Grandfather    Asthma Son    Depression Son    Mental illness Son    Learning disabilities Son    Uterine cancer Maternal Aunt     Allergies  Allergen Reactions   Clindamycin Hcl    Cymbalta [Duloxetine Hcl]     disorientation   Iodine Other (See Comments)    Had a vaginal procedure using iodine- her vaginal burned and peeled    Current Outpatient Medications on File Prior to Visit  Medication Sig Dispense Refill   albuterol  (VENTOLIN  HFA) 108 (90 Base) MCG/ACT inhaler Inhale 2 puffs into the lungs every 4 (four) hours as needed for shortness of breath. 1 each 0   b complex vitamins capsule Take 1 capsule by mouth daily.      EPINEPHrine  (EPIPEN  2-PAK) 0.3 mg/0.3 mL IJ SOAJ injection Inject 0.3 mg into the muscle as needed for anaphylaxis. 1 each 0   estradiol -levonorgestrel  (CLIMARAPRO) 0.045-0.015 MG/DAY Place 1 patch onto the skin once a week. 12 patch 5   ibuprofen  (ADVIL ) 800 MG tablet Take 1 tablet (800 mg total) by mouth every 8 (eight) hours as needed for mild pain or moderate pain. 30 tablet 0   Multiple Vitamins-Minerals (MULTIVITAMIN WITH MINERALS) tablet Take 1 tablet by mouth daily.     promethazine  (PHENERGAN ) 25 MG tablet Take 25 mg by mouth every 6 (six) hours as needed.  promethazine  (PHENERGAN ) 25 MG tablet Take 1 tablet (25 mg total) by mouth every 6 (six) hours as needed for nausea or vomiting. 30 tablet 2   triamcinolone  cream (KENALOG) 0.1 % Apply topically.     trimethoprim  (TRIMPEX ) 100 MG tablet Take 100 mg by mouth daily. As needed     busPIRone  (BUSPAR ) 10 MG tablet Take 2 tablets (20 mg total) by mouth 3 (three) times daily. For anxiety. (Patient not taking: Reported on 06/04/2024) 540 tablet 2   omeprazole  (PRILOSEC) 40 MG capsule Take 1 capsule (40 mg total) by mouth 2 (two) times daily before a meal. (Patient not taking: Reported on 06/04/2024) 60 capsule 2   ondansetron  (ZOFRAN ) 4 MG tablet Take 1 tablet (4 mg total) by mouth every 8 (eight) hours as needed for nausea or vomiting. (Patient not taking: Reported on 06/04/2024) 10 tablet 0   PARoxetine  (PAXIL ) 20 MG tablet Take 20 mg by mouth daily. (Patient not taking: Reported on 06/04/2024)     No current facility-administered medications on file prior to visit.    BP (!) 172/84   Pulse 72   Temp (!) 97.2 F (36.2 C) (Temporal)   Ht 5' 3 (1.6 m)   Wt 137 lb (62.1 kg)   LMP 02/27/2023 (Approximate)   SpO2 98%   BMI 24.27 kg/m  Objective:   Physical Exam HENT:     Right Ear: There is impacted cerumen.     Left Ear: There is impacted cerumen.  Cardiovascular:     Rate and Rhythm: Normal rate and regular rhythm.  Pulmonary:      Effort: Pulmonary effort is normal.     Breath sounds: Normal breath sounds.  Musculoskeletal:     Cervical back: Neck supple.  Skin:    General: Skin is warm and dry.  Neurological:     Mental Status: She is alert and oriented to person, place, and time.  Psychiatric:        Mood and Affect: Mood normal.           Assessment & Plan:  Hot flashes Assessment & Plan: Symptoms today suggestive of side effects from HRT.  Will check labs today including CBC, BMP. She will contact her GYN regarding the symptoms.    We discussed the importance of hydration.      Nausea and vomiting, unspecified vomiting type Assessment & Plan: Symptoms suggestive of side effects from HRT.  Will check labs today to ensure. CBC with differential and BMP pending.  She will also contact her GYN regarding the symptoms. Remove HRT patch for now.   Orders: -     CBC with Differential/Platelet -     Basic metabolic panel with GFR -     Lipase  Bilateral impacted cerumen Assessment & Plan: Bilateral cerumen impaction identified on exam. Patient consented to irrigation of canals bilaterally.  Bilateral canals irrigated. Patient tolerated well. TM's and canals post irrigation unremarkable.   Discussed home care instructions.           Krystal Rachal K Meghna Hagmann, NP

## 2024-06-04 NOTE — Assessment & Plan Note (Signed)
 Bilateral cerumen impaction identified on exam. Patient consented to irrigation of canals bilaterally.  Bilateral canals irrigated. Patient tolerated well. TM's and canals post irrigation unremarkable.   Discussed home care instructions.

## 2024-06-04 NOTE — Assessment & Plan Note (Signed)
 Symptoms today suggestive of side effects from HRT.  Will check labs today including CBC, BMP. She will contact her GYN regarding the symptoms.    We discussed the importance of hydration.

## 2024-06-04 NOTE — Assessment & Plan Note (Signed)
 Symptoms suggestive of side effects from HRT.  Will check labs today to ensure. CBC with differential and BMP pending.  She will also contact her GYN regarding the symptoms. Remove HRT patch for now.

## 2024-06-04 NOTE — Patient Instructions (Signed)
 Stop by the lab prior to leaving today. I will notify you of your results once received.   Remove your hormone patch now.  Call your gynecologist to discuss symptoms.  It was a pleasure to see you today!

## 2024-06-05 ENCOUNTER — Ambulatory Visit: Payer: Self-pay | Admitting: Primary Care

## 2024-06-05 LAB — BASIC METABOLIC PANEL WITH GFR
BUN: 7 mg/dL (ref 6–23)
CO2: 30 meq/L (ref 19–32)
Calcium: 9.9 mg/dL (ref 8.4–10.5)
Chloride: 103 meq/L (ref 96–112)
Creatinine, Ser: 0.94 mg/dL (ref 0.40–1.20)
GFR: 70.32 mL/min (ref >60.00)
Glucose, Bld: 90 mg/dL (ref 70–99)
Potassium: 4.3 meq/L (ref 3.5–5.1)
Sodium: 140 meq/L (ref 135–145)

## 2024-06-05 LAB — LIPASE: Lipase: 21 U/L (ref 11.0–59.0)

## 2024-06-05 LAB — CBC WITH DIFFERENTIAL/PLATELET
Basophils Absolute: 0.1 K/uL (ref 0.0–0.1)
Basophils Relative: 1.2 % (ref 0.0–3.0)
Eosinophils Absolute: 0.1 K/uL (ref 0.0–0.7)
Eosinophils Relative: 1.4 % (ref 0.0–5.0)
HCT: 35.5 % — ABNORMAL LOW (ref 36.0–46.0)
Hemoglobin: 11.9 g/dL — ABNORMAL LOW (ref 12.0–15.0)
Lymphocytes Relative: 29.5 % (ref 12.0–46.0)
Lymphs Abs: 1.5 K/uL (ref 0.7–4.0)
MCHC: 33.5 g/dL (ref 30.0–36.0)
MCV: 83.5 fl (ref 78.0–100.0)
Monocytes Absolute: 0.2 K/uL (ref 0.1–1.0)
Monocytes Relative: 4.7 % (ref 3.0–12.0)
Neutro Abs: 3.3 K/uL (ref 1.4–7.7)
Neutrophils Relative %: 63.2 % (ref 43.0–77.0)
Platelets: 369 K/uL (ref 150.0–400.0)
RBC: 4.25 Mil/uL (ref 3.87–5.11)
RDW: 15.5 % (ref 11.5–15.5)
WBC: 5.2 K/uL (ref 4.0–10.5)

## 2024-06-27 DIAGNOSIS — M5416 Radiculopathy, lumbar region: Secondary | ICD-10-CM | POA: Diagnosis not present

## 2024-06-27 DIAGNOSIS — M47812 Spondylosis without myelopathy or radiculopathy, cervical region: Secondary | ICD-10-CM | POA: Diagnosis not present

## 2024-07-17 DIAGNOSIS — R293 Abnormal posture: Secondary | ICD-10-CM | POA: Diagnosis not present

## 2024-07-17 DIAGNOSIS — M542 Cervicalgia: Secondary | ICD-10-CM | POA: Diagnosis not present

## 2024-07-23 DIAGNOSIS — M542 Cervicalgia: Secondary | ICD-10-CM | POA: Diagnosis not present

## 2024-07-23 DIAGNOSIS — R293 Abnormal posture: Secondary | ICD-10-CM | POA: Diagnosis not present

## 2024-08-02 DIAGNOSIS — M5416 Radiculopathy, lumbar region: Secondary | ICD-10-CM | POA: Diagnosis not present

## 2024-08-07 DIAGNOSIS — R293 Abnormal posture: Secondary | ICD-10-CM | POA: Diagnosis not present

## 2024-08-07 DIAGNOSIS — M542 Cervicalgia: Secondary | ICD-10-CM | POA: Diagnosis not present

## 2024-08-14 DIAGNOSIS — M542 Cervicalgia: Secondary | ICD-10-CM | POA: Diagnosis not present

## 2024-08-14 DIAGNOSIS — R293 Abnormal posture: Secondary | ICD-10-CM | POA: Diagnosis not present

## 2024-09-09 DIAGNOSIS — F418 Other specified anxiety disorders: Secondary | ICD-10-CM | POA: Diagnosis not present

## 2024-09-12 DIAGNOSIS — M461 Sacroiliitis, not elsewhere classified: Secondary | ICD-10-CM | POA: Diagnosis not present

## 2024-09-12 DIAGNOSIS — M47812 Spondylosis without myelopathy or radiculopathy, cervical region: Secondary | ICD-10-CM | POA: Diagnosis not present

## 2024-10-01 ENCOUNTER — Ambulatory Visit: Payer: Self-pay

## 2024-10-01 NOTE — Telephone Encounter (Signed)
 Noted. Agree with nursing triage decision. Sophronia Shivers, NP evaluation.

## 2024-10-01 NOTE — Telephone Encounter (Signed)
 FYI Only or Action Required?: FYI only for provider: appointment scheduled on 10/02/2024.  Patient was last seen in primary care on 06/04/2024 by Gretta Comer POUR, NP.  Called Nurse Triage reporting Hypertension.  Symptoms began about a month ago.  Interventions attempted: Nothing.  Symptoms are: gradually worsening.  Triage Disposition: See Physician Within 24 Hours (overriding See PCP When Office is Open (Within 3 Days))  Patient/caregiver understands and will follow disposition?: Yes     Copied from CRM [id number]. Topic: Clinical - Red Word Triage >> Oct 01, 2024 10:51 AM Suzen RAMAN wrote: Red Word that prompted transfer to Nurse Triage: elevated BP and elevated heart rate. Reason for Disposition  Systolic BP >= 160 OR Diastolic >= 100  Answer Assessment - Initial Assessment Questions 1. BLOOD PRESSURE: What is your blood pressure? Did you take at least two measurements 5 minutes apart?     172/109 HR 91  2. ONSET: When did you take your blood pressure?     1 month ago  3. HOW: How did you take your blood pressure? (e.g., automatic home BP monitor, visiting nurse)     Home BP monitor  4. HISTORY: Do you have a history of high blood pressure?     Yes in the past  5. MEDICINES: Are you taking any medicines for blood pressure? Have you missed any doses recently?    Was in the past off of meds currently  6. OTHER SYMPTOMS: Do you have any symptoms? (e.g., blurred vision, chest pain, difficulty breathing, headache, weakness)     Some pressure in chest with HR that is elevated    Patient states her HR usually is around 110 during the day. Advised to continue and monitor BP. Advised to go to ED for worsening symptoms and verbalized understanding.  Protocols used: Blood Pressure - High-A-AH

## 2024-10-02 ENCOUNTER — Ambulatory Visit: Admitting: General Practice

## 2024-10-02 ENCOUNTER — Other Ambulatory Visit: Payer: Self-pay | Admitting: Primary Care

## 2024-10-02 ENCOUNTER — Encounter: Payer: Self-pay | Admitting: General Practice

## 2024-10-02 VITALS — BP 118/82 | HR 98 | Temp 99.1°F | Ht 63.0 in | Wt 128.0 lb

## 2024-10-02 DIAGNOSIS — R5382 Chronic fatigue, unspecified: Secondary | ICD-10-CM

## 2024-10-02 DIAGNOSIS — H5213 Myopia, bilateral: Secondary | ICD-10-CM | POA: Diagnosis not present

## 2024-10-02 DIAGNOSIS — I10 Essential (primary) hypertension: Secondary | ICD-10-CM | POA: Diagnosis not present

## 2024-10-02 DIAGNOSIS — J3089 Other allergic rhinitis: Secondary | ICD-10-CM

## 2024-10-02 DIAGNOSIS — I499 Cardiac arrhythmia, unspecified: Secondary | ICD-10-CM

## 2024-10-02 DIAGNOSIS — R0609 Other forms of dyspnea: Secondary | ICD-10-CM | POA: Diagnosis not present

## 2024-10-02 DIAGNOSIS — R0981 Nasal congestion: Secondary | ICD-10-CM | POA: Diagnosis not present

## 2024-10-02 LAB — COMPREHENSIVE METABOLIC PANEL WITH GFR
ALT: 15 U/L (ref 0–35)
AST: 17 U/L (ref 0–37)
Albumin: 5.1 g/dL (ref 3.5–5.2)
Alkaline Phosphatase: 59 U/L (ref 39–117)
BUN: 9 mg/dL (ref 6–23)
CO2: 27 meq/L (ref 19–32)
Calcium: 10 mg/dL (ref 8.4–10.5)
Chloride: 105 meq/L (ref 96–112)
Creatinine, Ser: 0.91 mg/dL (ref 0.40–1.20)
GFR: 72.95 mL/min (ref >60.00)
Glucose, Bld: 89 mg/dL (ref 70–99)
Potassium: 4.2 meq/L (ref 3.5–5.1)
Sodium: 141 meq/L (ref 135–145)
Total Bilirubin: 0.6 mg/dL (ref 0.2–1.2)
Total Protein: 7.4 g/dL (ref 6.0–8.3)

## 2024-10-02 LAB — CBC
HCT: 39.3 % (ref 36.0–46.0)
Hemoglobin: 13.1 g/dL (ref 12.0–15.0)
MCHC: 33.4 g/dL (ref 30.0–36.0)
MCV: 85.2 fl (ref 78.0–100.0)
Platelets: 359 K/uL (ref 150.0–400.0)
RBC: 4.61 Mil/uL (ref 3.87–5.11)
RDW: 15.5 % (ref 11.5–15.5)
WBC: 6.6 K/uL (ref 4.0–10.5)

## 2024-10-02 LAB — BRAIN NATRIURETIC PEPTIDE: Pro B Natriuretic peptide (BNP): 8 pg/mL (ref 0.0–100.0)

## 2024-10-02 MED ORDER — LISINOPRIL 5 MG PO TABS: 5.0000 mg | ORAL_TABLET | Freq: Every day | ORAL | 0 refills | Status: DC

## 2024-10-02 NOTE — Assessment & Plan Note (Signed)
 Nasal Congestion/Ear Pressure/Sinus Pressure: Try using Flonase (fluticasone) nasal spray. Instill 1 spray in each nostril twice daily.

## 2024-10-02 NOTE — Patient Instructions (Signed)
 Your recent lab tests have resulted, and my comments are displayed below. Please do not hesitate to reach out with any questions!  Start Lisnopril 5 mg once daily.   Start monitoring your blood pressure daily, around the same time of day, for the next 2-3 weeks.  Ensure that you have rested for 30 minutes prior to checking your blood pressure.   Record your readings and notify me if you see numbers consistently at or above 130 on top and/or 90 on bottom.  Send readings via mychart to [place] or myself.   F/u  in 2 weeks with Mallie.   It was a pleasure meeting you!

## 2024-10-02 NOTE — Progress Notes (Signed)
 Established Patient Office Visit  Subjective   Patient ID: Jadalynn Burr, female    DOB: 21-Oct-1971  Age: 53 y.o. MRN: [id number]  Chief Complaint  Patient presents with   Hypertension    Patients BP has been elevated x 3 weeks; patient states pulse is elevated as well. Patient states she has had SOB as well and then this morning an episode of feeling faint. No chest pains but has been very nauseous; the smell of food tears her stomach up.      Hypertension Associated symptoms include malaise/fatigue. Pertinent negatives include no chest pain, headaches or shortness of breath.    Starlina Lapre is a 53 year old female, patient of Mallie Gaskins, NP with past medical history of HTN, GAD, chronic fatigue, dyslipidemia, bipolar disorder, cannabis-induced psychotic disorder with delusions, anemia presents today for HTN.   Discussed the use of AI scribe software for clinical note transcription with the patient, who gave verbal consent to proceed.  History of Present Illness Karina Nofsinger is a 53 year old female with hypertension who presents with elevated blood pressure and heart rate.  She has a history of hypertension, previously managed with lisinopril , which she discontinued after her blood pressure stabilized. Over the past three weeks, she has experienced elevated blood pressure readings at home, reaching up to 179/149 mmHg. She also has a consistently elevated pulse rate between 110 and 120 beats per minute, accompanied by a sensation of her heart 'pounding out of her chest'.  She has attempted to manage her symptoms with relaxation techniques, such as deep breathing and muscle relaxation, but these have been ineffective. She denies any significant changes in diet or lifestyle that could account for the increase in blood pressure, although she acknowledges some stress. Despite efforts to relax, her heart rate remains elevated, contributing to her anxiety.  Additional symptoms include  shortness of breath, nausea, and a feeling of faintness. She notes difficulty with physical activities, such as walking her dogs, and experiences fatigue and weakness, which she describes as different from her previous chronic fatigue. No chest pain or palpitations, but she feels winded with exertion.  Her family history includes her mother having heart issues at the age of 38 due to plaque in the heart, although she has been evaluated by a cardiologist and was found to have no plaque. She has a history of anemia and chronic fatigue, and she has previously been on buspirone  and Paxil  for anxiety, but discontinued them due to side effects, including insomnia from Paxil .  In the review of symptoms, she denies one-sided body weakness and slurred speech but reports feeling weak and exhausted. She also mentions morning congestion.    Patient Active Problem List   Diagnosis Date Noted   Irregular heartbeat 10/02/2024   Nasal congestion 10/02/2024   Dyspnea on exertion 10/02/2024   Nausea and vomiting 06/04/2024   Cerumen impaction 06/04/2024   Alopecia 01/11/2024   Iron  deficiency anemia 10/20/2023   Urinary urgency 10/05/2023   Environmental and seasonal allergies 10/05/2023   Allergy to bee sting 10/05/2023   Hot flashes 10/05/2023   Bipolar disorder (HCC) 10/05/2023   Sleep disturbance 08/16/2023   Mania (HCC) 08/01/2023   Cannabis-induced psychotic disorder with delusions (HCC) 08/01/2023   Fatigue 04/18/2023   Gastric ulcer 04/13/2023   Osteoarthritis of hip 04/04/2023   Chronic hip pain, bilateral 03/23/2023   Anemia 12/08/2022   Dyslipidemia 07/27/2022   Chest pain 07/06/2022   Chronic fatigue 06/03/2022   Restless legs  06/03/2022   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   Chronic back pain 06/24/2021   Encounter for annual general medical examination with abnormal findings in adult 01/15/2019   Hypertension 01/02/2019   Vertigo 06/20/2018   GAD (generalized anxiety disorder)  06/20/2018   Arthritis 06/20/2018   Past Medical History:  Diagnosis Date   Acute appendicitis 09/18/2021   Acute non-recurrent sinusitis 06/04/2020   Anxiety    Arthritis    Bilateral lower extremity edema 06/03/2022   Carpal tunnel syndrome on right 03/15/2017   Common migraine with intractable migraine 01/25/2018   Complication of anesthesia    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;  Service: Endoscopy;  Laterality: N/A;   XI ROBOTIC LAPAROSCOPIC ASSISTED APPENDECTOMY N/A 09/18/2021   Procedure: XI ROBOTIC LAPAROSCOPIC ASSISTED APPENDECTOMY;  Surgeon: Tye Millet, DO;  Location: [facility];  Service: General;  Laterality: N/A;   Allergies  Allergen Reactions   Clindamycin Hcl    Cymbalta [Duloxetine Hcl]     disorientation   Iodine Other (See Comments)    Had a vaginal procedure using iodine- her vaginal burned and peeled         10/02/2024   12:25 PM 06/04/2024    3:01 PM 01/11/2024   12:08 PM  Depression screen PHQ 2/9  Decreased Interest 1 1 2   Down, Depressed, Hopeless 1 1 1   PHQ - 2 Score 2 2 3   Altered sleeping 1 3 1   Tired, decreased energy 3 2 3   Change in appetite 2 3 2    Feeling bad or failure about yourself  0 1 1  Trouble concentrating 0 0 2  Moving slowly or fidgety/restless 0 0 0  Suicidal thoughts 0 0 0  PHQ-9 Score 8 11 12   Difficult doing work/chores Somewhat difficult Not difficult at all Very difficult       10/02/2024   12:26 PM 06/04/2024    3:02 PM 01/11/2024   12:08 PM 10/05/2023   11:55 AM  GAD 7 : Generalized Anxiety Score  Nervous, Anxious, on Edge 2 3 2 3   Control/stop worrying 1 0 1 1  Worry too much - different things 1 0 1 0  Trouble relaxing 2 1 3 3   Restless 1 0 3 3  Easily annoyed or irritable 0 2 0 2  Afraid - awful might happen 2 2 1 1   Total GAD 7 Score 9 8 11 13   Anxiety Difficulty Somewhat difficult Not difficult at all Extremely difficult Very difficult      Review of Systems  Constitutional:  Positive for malaise/fatigue. Negative for chills and fever.  Respiratory:  Negative for shortness of breath.   Cardiovascular:  Negative for chest pain.  Gastrointestinal:  Negative for abdominal pain, constipation,  diarrhea, heartburn, nausea and vomiting.  Genitourinary:  Negative for dysuria, frequency and urgency.  Neurological:  Negative for dizziness, weakness and headaches.  Endo/Heme/Allergies:  Negative for polydipsia.  Psychiatric/Behavioral:  Negative for depression and suicidal ideas. The patient is not nervous/anxious.       Objective:     BP 118/82   Pulse 98   Temp 99.1 F (37.3 C) (Oral)   Ht 5' 3 (1.6 m)   Wt 128 lb (58.1 kg)   LMP 02/27/2023 (Approximate)   SpO2 98%   BMI 22.67 kg/m  BP Readings from Last 3 Encounters:  10/02/24 118/82  06/04/24 (!) 148/82  05/20/24 136/82   Wt Readings from Last 3 Encounters:  10/02/24 128 lb (58.1 kg)  06/04/24 137 lb (62.1 kg)  05/20/24 136 lb 3.2 oz (61.8 kg)      Physical Exam Vitals and nursing note reviewed.  Constitutional:      Appearance: Normal appearance.  HENT:     Right Ear: Tympanic membrane, ear canal and external ear normal.      Left Ear: Tympanic membrane, ear canal and external ear normal.  Cardiovascular:     Rate and Rhythm: Normal rate and regular rhythm.     Pulses: Normal pulses.     Heart sounds: Normal heart sounds.  Pulmonary:     Effort: Pulmonary effort is normal.     Breath sounds: Normal breath sounds.  Neurological:     Mental Status: She is alert and oriented to person, place, and time.     Cranial Nerves: Cranial nerves 2-12 are intact.     Sensory: Sensation is intact.     Motor: Motor function is intact.     Coordination: Coordination is intact.     Gait: Gait is intact.  Psychiatric:        Mood and Affect: Mood normal.        Behavior: Behavior normal.        Thought Content: Thought content normal.        Judgment: Judgment normal.      No results found for any visits on 10/02/24.     The 10-year ASCVD risk score (Arnett DK, et al., 2019) is: 1.6%    Assessment & Plan:  Primary hypertension -     Lisinopril ; Take 1 tablet (5 mg total) by mouth daily.  Dispense: 30 tablet; Refill: 0  Irregular heartbeat -     CBC -     Comprehensive metabolic panel with GFR -     TSH  Chronic fatigue -     VITAMIN D  25 Hydroxy (Vit-D Deficiency, Fractures) -     Vitamin B12  Dyspnea on exertion -     Brain natriuretic peptide  Nasal congestion Assessment & Plan: Nasal Congestion/Ear Pressure/Sinus Pressure: Try using Flonase (fluticasone) nasal spray. Instill 1 spray in each nostril twice daily.       Assessment and Plan Assessment & Plan Essential hypertension with tachycardia and associated symptoms (dyspnea, fatigue) Hypertension with recent increase in blood pressure to 179/149 mmHg and tachycardia with heart rate 110-120 bpm. Symptoms include dyspnea, fatigue, and nausea. Differential includes anxiety, anemia, thyroid  dysfunction, and heart failure.  - BP elevated initially but improved with second reading.  - No red flags on exam. Neuro exam within normal limits.  -  Ordered CBC, thyroid  function tests, vitamin D , B12, and BNP. - Given home readings have been 160s-170s-100-140s for the last three weeks, will start lisinopril  5 mg once  daily at bedtime. - Instructed to check blood pressure daily and send readings via MyChart in one week. - Scheduled follow-up with primary care provider in two weeks.   Return in about 2 weeks (around 10/16/2024) for BP f/u with PCP.    Carrol Aurora, NP

## 2024-10-03 ENCOUNTER — Ambulatory Visit: Payer: Self-pay | Admitting: General Practice

## 2024-10-03 ENCOUNTER — Encounter (INDEPENDENT_AMBULATORY_CARE_PROVIDER_SITE_OTHER): Payer: Self-pay

## 2024-10-03 DIAGNOSIS — F411 Generalized anxiety disorder: Secondary | ICD-10-CM

## 2024-10-03 LAB — TSH: TSH: 0.51 u[IU]/mL (ref 0.35–5.50)

## 2024-10-03 LAB — VITAMIN B12: Vitamin B-12: 333 pg/mL (ref 211–911)

## 2024-10-03 LAB — VITAMIN D 25 HYDROXY (VIT D DEFICIENCY, FRACTURES): VITD: 39.96 ng/mL (ref 30.00–100.00)

## 2024-10-04 MED ORDER — FLUOXETINE HCL 10 MG PO CAPS: 10.0000 mg | ORAL_CAPSULE | Freq: Every day | ORAL | 0 refills | Status: DC

## 2024-10-04 NOTE — Telephone Encounter (Signed)
Please see the MyChart message reply(ies) for my assessment and plan.  The patient gave consent for this Medical Advice Message and is aware that it may result in a bill to their insurance company as well as the possibility that this may result in a co-payment or deductible. They are an established patient, but are not seeking medical advice exclusively about a problem treated during an in person or video visit in the last 7 days. I did not recommend an in person or video visit within 7 days of my reply.  I spent a total of 10 minutes cumulative time within 7 days through MyChart messaging Delaney Perona K Shivank Pinedo, NP  

## 2024-10-08 DIAGNOSIS — I10 Essential (primary) hypertension: Secondary | ICD-10-CM | POA: Diagnosis not present

## 2024-10-08 DIAGNOSIS — K219 Gastro-esophageal reflux disease without esophagitis: Secondary | ICD-10-CM | POA: Diagnosis not present

## 2024-10-08 DIAGNOSIS — R3989 Other symptoms and signs involving the genitourinary system: Secondary | ICD-10-CM | POA: Diagnosis not present

## 2024-10-24 ENCOUNTER — Other Ambulatory Visit: Payer: Self-pay | Admitting: General Practice

## 2024-10-24 DIAGNOSIS — I10 Essential (primary) hypertension: Secondary | ICD-10-CM

## 2024-10-30 ENCOUNTER — Encounter: Payer: Self-pay | Admitting: Internal Medicine

## 2024-10-31 ENCOUNTER — Ambulatory Visit: Admitting: Primary Care

## 2024-11-13 ENCOUNTER — Encounter: Payer: Self-pay | Admitting: Primary Care

## 2024-11-13 ENCOUNTER — Ambulatory Visit: Admitting: Primary Care

## 2024-11-13 VITALS — BP 120/66 | HR 89 | Temp 97.9°F | Ht 63.0 in | Wt 128.0 lb

## 2024-11-13 DIAGNOSIS — K219 Gastro-esophageal reflux disease without esophagitis: Secondary | ICD-10-CM | POA: Diagnosis not present

## 2024-11-13 DIAGNOSIS — M7918 Myalgia, other site: Secondary | ICD-10-CM | POA: Diagnosis not present

## 2024-11-13 DIAGNOSIS — M47812 Spondylosis without myelopathy or radiculopathy, cervical region: Secondary | ICD-10-CM | POA: Diagnosis not present

## 2024-11-13 DIAGNOSIS — N951 Menopausal and female climacteric states: Secondary | ICD-10-CM | POA: Diagnosis not present

## 2024-11-13 DIAGNOSIS — I10 Essential (primary) hypertension: Secondary | ICD-10-CM | POA: Diagnosis not present

## 2024-11-13 DIAGNOSIS — M5416 Radiculopathy, lumbar region: Secondary | ICD-10-CM | POA: Diagnosis not present

## 2024-11-13 DIAGNOSIS — M461 Sacroiliitis, not elsewhere classified: Secondary | ICD-10-CM | POA: Diagnosis not present

## 2024-11-13 MED ORDER — LISINOPRIL 5 MG PO TABS: 5.0000 mg | ORAL_TABLET | Freq: Every day | ORAL | 1 refills | Status: AC

## 2024-11-13 MED ORDER — OMEPRAZOLE 20 MG PO CPDR: 20.0000 mg | DELAYED_RELEASE_CAPSULE | Freq: Every day | ORAL | 1 refills | Status: AC

## 2024-11-13 NOTE — Assessment & Plan Note (Signed)
 Improved.  Continue omeprazole  20 mg daily. Refill provided.

## 2024-11-13 NOTE — Patient Instructions (Signed)
 Stop by the lab prior to leaving today. I will notify you of your results once received.   Continue lisinopril  5 mg daily for blood pressure and omeprazole  20 mg daily for heartburn.  Schedule a physical for 6 months  It was a pleasure to see you today!

## 2024-11-13 NOTE — Assessment & Plan Note (Signed)
 Improved.  Continue lisinopril  5 mg daily. BMP pending.

## 2024-11-13 NOTE — Assessment & Plan Note (Signed)
 She will resume her Climara  Pro patch. She has refills on her current prescription and we will refill at the pharmacy. She will notify if she experiences any problems

## 2024-11-13 NOTE — Progress Notes (Signed)
 Subjective:    Patient ID: Krystal Grant, female    DOB: 04-19-1971, 53 y.o.   MRN: [id number]  Krystal Grant is a very pleasant 53 y.o. female with a history of hypertension, hot flashes, cannabis induced psychotic disorder with delusions, GAD, bipolar disorder, sleep disturbance who presents today for follow-up of hypertension and sleep disturbance.  Evaluated by Carrol, NP on 10/02/2024 for a 3-week history of elevated blood pressure and heart rate readings.  Readings were up to 179/149 with heart rate ranging 110 to 120 bpm.  Previously managed on lisinopril  which was discontinued greater than 1 year ago due to blood pressure stability.  During this visit she was initiated on lisinopril  5 mg daily.  Labs were ordered including CBC, TSH, vitamin D , vitamin B12, BNP-all labs grossly negative.  She is here for follow-up today.  Since her last visit she is compliant to lisinopril  5 mg daily. She's monitoring her BP at home and is getting readings 110s-120s/70-80s. She's been under a lot of stress with family.    BP Readings from Last 3 Encounters:  11/13/24 120/66  10/02/24 118/82  06/04/24 (!) 148/82   She was also recently diagnosed with GERD for symptoms of vomiting and nausea. Treated with omeprazole  20 mg and is doing much better. She is needing a refill.   She was previously managed on a hormonal patch for vasomotor symptoms. Followed with GYN. She topped using after one month as she thought it was causing nausea and vomiting. She would like to resume as she's experiencing ongoing hot flashes which are terrible. She's also had difficulty with sleeping and night sweats.    Review of Systems  Respiratory:  Negative for shortness of breath.   Cardiovascular:  Negative for chest pain.  Neurological:  Negative for dizziness and headaches.         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    Social History   Socioeconomic History   Marital status: Married    Spouse name: Not on file   Number of children: 3   Years of education: 12   Highest education level: Not on file  Occupational History   Occupation: [employer]   Tobacco Use   Smoking status: Former    Types: E-cigarettes    Quit date: 2019    Years since quitting: 6.9   Smokeless tobacco: Never  Vaping Use   Vaping status: Every Day   Devices: non-nicotine  Substance and Sexual Activity   Alcohol use: No   Drug use: No   Sexual activity: Not on file  Other Topics Concern   Not on file  Social History Narrative   Lives with husband and kids   Caffeine use: 2 per day   Right handed    Social Drivers of Health   Tobacco Use: Medium Risk (11/13/2024)  Patient History    Smoking Tobacco Use: Former    Smokeless Tobacco Use: Never    Passive Exposure: Not on file  Financial Resource Strain: High Risk (10/08/2024)   Received [facility]   Overall Financial Resource Strain (CARDIA)    Difficulty of Paying Living Expenses: Hard  Food Insecurity: Food Insecurity Present (10/08/2024)   Received from [facility]   Epic    Within the past 12 months, you worried that your food would run out before you got the money to buy more.: Sometimes true    Within the past 12 months, the food you bought just didn't last and you didn't have money to get  more.: Never true  Transportation Needs: No Transportation Needs (10/08/2024)   Received from [facility] - Transportation    In the past 12 months, has lack of transportation kept you from medical appointments or from getting medications?: No    Lack of Transportation (Non-Medical): No  Physical Activity: Not on file  Stress: Not on file  Social Connections: Not on file  Intimate Partner Violence: Not on file  Depression (PHQ2-9): Low Risk (11/13/2024)   Depression (PHQ2-9)    PHQ-2 Score: 4  Recent Concern: Depression (PHQ2-9) - Medium Risk (10/02/2024)   Depression (PHQ2-9)    PHQ-2 Score: 8  Alcohol Screen: Not on file  Housing: High Risk (10/08/2024)   Received from [facility]   Epic    In the last 12 months, was there a time when you were not able to pay the mortgage or rent on time?: Yes    In the past 12 months, how many times have you moved where you were living?: 0    At any time in the past 12 months, were you homeless or living in a shelter (including now)?: No  Utilities: Not At Risk (10/08/2024)   Received from [facility]   Epic    In the past 12 months has the electric, gas, oil, or water  company threatened to shut off services in your home?: No  Health Literacy: Not on file    Past Surgical History:  Procedure Laterality Date   CARPAL TUNNEL RELEASE  06/28/2017   COLONOSCOPY WITH PROPOFOL  N/A 04/13/2023   Procedure: COLONOSCOPY WITH PROPOFOL ;  Surgeon: Unk Corinn Skiff, MD;  Location: [facility];  Service: Endoscopy;  Laterality: N/A;   ELBOW ARTHROSCOPY  06/28/2017   ESOPHAGOGASTRODUODENOSCOPY (EGD) WITH PROPOFOL  N/A 04/13/2023   Procedure: ESOPHAGOGASTRODUODENOSCOPY (EGD) WITH PROPOFOL ;  Surgeon: Unk Corinn Skiff, MD;  Location: [facility];  Service: Endoscopy;  Laterality: N/A;   XI ROBOTIC LAPAROSCOPIC ASSISTED APPENDECTOMY N/A 09/18/2021   Procedure: XI ROBOTIC  LAPAROSCOPIC ASSISTED APPENDECTOMY;  Surgeon: Tye Millet, DO;  Location: [facility];  Service: General;  Laterality: N/A;    Family History  Problem Relation Age of Onset   Migraines Mother    Hypertension Mother    Heart attack Mother 9   Nephrolithiasis Mother    Alcohol abuse Sister    Depression Sister    Arthritis Maternal Grandmother    Hearing loss Maternal Grandmother    Heart attack Maternal Grandfather    Cancer Paternal Grandmother        Breast   Hearing loss Paternal Grandmother    Heart attack Paternal Grandmother    Breast cancer Paternal Grandmother    Heart attack Paternal Grandfather    Asthma Son  Depression Son    Mental illness Son    Learning disabilities Son    Uterine cancer Maternal Aunt     Allergies[1]  Medications Ordered Prior to Encounter[2]  BP 120/66   Pulse 89   Temp 97.9 F (36.6 C) (Oral)   Ht 5' 3 (1.6 m)   Wt 128 lb (58.1 kg)   LMP 02/27/2023   SpO2 97%   BMI 22.67 kg/m  Objective:   Physical Exam Cardiovascular:     Rate and Rhythm: Normal rate and regular rhythm.  Pulmonary:     Effort: Pulmonary effort is normal.     Breath sounds: Normal breath sounds.  Musculoskeletal:     Cervical back: Neck supple.  Skin:    General: Skin is warm and dry.  Neurological:     Mental Status: She is alert and oriented to person, place, and time.  Psychiatric:        Mood and Affect: Mood normal.     Physical Exam        Assessment & Plan:  Primary hypertension Assessment & Plan: Improved.  Continue lisinopril  5 mg daily. BMP pending.  Orders: -     Basic metabolic panel with GFR -     Lisinopril ; Take 1 tablet (5 mg total) by mouth daily. for blood pressure.  Dispense: 90 tablet; Refill: 1  Gastroesophageal reflux disease, unspecified whether esophagitis present Assessment & Plan: Improved.  Continue omeprazole  20 mg daily. Refill provided.   Orders: -     Omeprazole ; Take 1 capsule (20 mg total) by mouth  daily. for heartburn.  Dispense: 90 capsule; Refill: 1  Vasomotor symptoms due to menopause Assessment & Plan: She will resume her Climara  Pro patch. She has refills on her current prescription and we will refill at the pharmacy. She will notify if she experiences any problems     Assessment and Plan Assessment & Plan         Comer MARLA Gaskins, NP       [1]  Allergies Allergen Reactions   Clindamycin Hcl    Cymbalta [Duloxetine Hcl]     disorientation   Iodine Other (See Comments)    Had a vaginal procedure using iodine- her vaginal burned and peeled  [2]  Current Outpatient Medications on File Prior to Visit  Medication Sig Dispense Refill   albuterol  (VENTOLIN  HFA) 108 (90 Base) MCG/ACT inhaler INHALE 2 PUFFS INTO THE LUNGS EVERY 4 (FOUR) HOURS AS NEEDED FOR SHORTNESS OF BREATH 8 g 0   b complex vitamins capsule Take 1 capsule by mouth daily.     EPINEPHrine  (EPIPEN  2-PAK) 0.3 mg/0.3 mL IJ SOAJ injection Inject 0.3 mg into the muscle as needed for anaphylaxis. 1 each 0   ibuprofen  (ADVIL ) 800 MG tablet Take 1 tablet (800 mg total) by mouth every 8 (eight) hours as needed for mild pain or moderate pain. 30 tablet 0   Multiple Vitamins-Minerals (MULTIVITAMIN WITH MINERALS) tablet Take 1 tablet by mouth daily.     promethazine  (PHENERGAN ) 25 MG tablet Take 25 mg by mouth every 6 (six) hours as needed.     triamcinolone  cream (KENALOG) 0.1 % Apply topically.     trimethoprim  (TRIMPEX ) 100 MG tablet Take 100 mg by mouth daily. As needed     No current facility-administered medications on file prior to visit.

## 2024-11-14 ENCOUNTER — Ambulatory Visit: Payer: Self-pay | Admitting: Primary Care

## 2024-11-14 LAB — BASIC METABOLIC PANEL WITH GFR
BUN: 16 mg/dL (ref 6–23)
CO2: 27 meq/L (ref 19–32)
Calcium: 9.4 mg/dL (ref 8.4–10.5)
Chloride: 104 meq/L (ref 96–112)
Creatinine, Ser: 0.88 mg/dL (ref 0.40–1.20)
GFR: 75.88 mL/min (ref >60.00)
Glucose, Bld: 71 mg/dL (ref 70–99)
Potassium: 4.6 meq/L (ref 3.5–5.1)
Sodium: 137 meq/L (ref 135–145)

## 2024-11-20 ENCOUNTER — Other Ambulatory Visit: Payer: Self-pay

## 2024-11-20 ENCOUNTER — Emergency Department
Admission: EM | Admit: 2024-11-20 | Discharge: 2024-11-20 | Disposition: A | Discharge status: Home / Self Care | Source: Ambulatory Visit | Attending: Emergency Medicine | Admitting: Emergency Medicine

## 2024-11-20 ENCOUNTER — Encounter: Payer: Self-pay | Admitting: Medical Oncology

## 2024-11-20 ENCOUNTER — Emergency Department

## 2024-11-20 DIAGNOSIS — R0789 Other chest pain: Secondary | ICD-10-CM | POA: Insufficient documentation

## 2024-11-20 DIAGNOSIS — M25512 Pain in left shoulder: Secondary | ICD-10-CM | POA: Diagnosis not present

## 2024-11-20 DIAGNOSIS — R7989 Other specified abnormal findings of blood chemistry: Secondary | ICD-10-CM | POA: Insufficient documentation

## 2024-11-20 DIAGNOSIS — R079 Chest pain, unspecified: Secondary | ICD-10-CM | POA: Diagnosis not present

## 2024-11-20 DIAGNOSIS — R0781 Pleurodynia: Secondary | ICD-10-CM | POA: Diagnosis not present

## 2024-11-20 DIAGNOSIS — M62838 Other muscle spasm: Secondary | ICD-10-CM | POA: Diagnosis not present

## 2024-11-20 DIAGNOSIS — R911 Solitary pulmonary nodule: Secondary | ICD-10-CM | POA: Diagnosis not present

## 2024-11-20 DIAGNOSIS — R918 Other nonspecific abnormal finding of lung field: Secondary | ICD-10-CM | POA: Diagnosis not present

## 2024-11-20 LAB — BASIC METABOLIC PANEL WITH GFR
Anion gap: 12 (ref 5–15)
BUN: 13 mg/dL (ref 6–20)
CO2: 24 mmol/L (ref 22–32)
Calcium: 8.7 mg/dL — ABNORMAL LOW (ref 8.9–10.3)
Chloride: 102 mmol/L (ref 98–111)
Creatinine, Ser: 0.79 mg/dL (ref 0.44–1.00)
GFR, Estimated: >60 mL/min
Glucose, Bld: 100 mg/dL — ABNORMAL HIGH (ref 70–99)
Potassium: 4.8 mmol/L (ref 3.5–5.1)
Sodium: 137 mmol/L (ref 135–145)

## 2024-11-20 LAB — CBC
HCT: 36.6 % (ref 36.0–46.0)
Hemoglobin: 12 g/dL (ref 12.0–15.0)
MCH: 28.2 pg (ref 26.0–34.0)
MCHC: 32.8 g/dL (ref 30.0–36.0)
MCV: 86.1 fL (ref 80.0–100.0)
Platelets: 451 K/uL — ABNORMAL HIGH (ref 150–400)
RBC: 4.25 MIL/uL (ref 3.87–5.11)
RDW: 14.5 % (ref 11.5–15.5)
WBC: 7.7 K/uL (ref 4.0–10.5)
nRBC: 0 % (ref 0.0–0.2)

## 2024-11-20 LAB — TROPONIN T, HIGH SENSITIVITY
Troponin T High Sensitivity: 16 ng/L (ref 0–19)
Troponin T High Sensitivity: <15 ng/L (ref 0–19)

## 2024-11-20 LAB — D-DIMER, QUANTITATIVE: D-Dimer, Quant: 1.5 ug{FEU}/mL — ABNORMAL HIGH (ref 0.00–0.50)

## 2024-11-20 LAB — RESP PANEL BY RT-PCR (RSV, FLU A&B, COVID)  RVPGX2
Influenza A by PCR: NEGATIVE
Influenza B by PCR: NEGATIVE
Resp Syncytial Virus by PCR: NEGATIVE
SARS Coronavirus 2 by RT PCR: NEGATIVE

## 2024-11-20 MED ORDER — IOHEXOL 350 MG/ML SOLN
75.0000 mL | Freq: Once | INTRAVENOUS | Status: AC | PRN
  Administered 2024-11-20: 75 mL via INTRAVENOUS

## 2024-11-20 MED ORDER — KETOROLAC TROMETHAMINE 15 MG/ML IJ SOLN
15.0000 mg | Freq: Once | INTRAMUSCULAR | Status: AC
  Administered 2024-11-20: 15 mg via INTRAVENOUS
  Filled 2024-11-20: qty 1

## 2024-11-20 MED ORDER — KETOROLAC TROMETHAMINE 15 MG/ML IJ SOLN: 15.0000 mg | Freq: Once | INTRAMUSCULAR | Status: DC

## 2024-11-20 MED ORDER — CYCLOBENZAPRINE HCL 5 MG PO TABS: 5.0000 mg | ORAL_TABLET | Freq: Three times a day (TID) | ORAL | 0 refills | Status: AC | PRN

## 2024-11-20 MED ORDER — OXYCODONE HCL 5 MG PO TABS
5.0000 mg | ORAL_TABLET | Freq: Once | ORAL | Status: AC
  Administered 2024-11-20: 5 mg via ORAL
  Filled 2024-11-20: qty 1

## 2024-11-20 NOTE — Discharge Instructions (Addendum)
 Flexeril  can be sedating so do not drive or work while on this. Use the steroids provided by orthopedics and return to the ER for worsening symptoms or any other concerns and follow-up with the oncology clinic with the incidental findings noted on CT scan as we discussed.    1. No evidence of pulmonary embolism.  2. Multiple indeterminate pulmonary nodules, largest a 1.1 cm solid nodule in  the left lingula; recommend PET/CT, and/or tissue sampling.  3. Indeterminate possible hypodense hepatic lesions, poorly visualized on this  exam; consider dedicated contrast-enhanced CT or MRI of the abdomen for  characterization.

## 2024-11-20 NOTE — ED Provider Notes (Signed)
 "  [facility] Provider Note    Event Date/Time   First MD Initiated Contact with Patient 11/20/24 1115     (approximate)   History   Shoulder Pain and Chest Pain   HPI  Krystal Grant is a 53 y.o. female with history of bipolar, hypertension who comes in with concerns for left shoulder pain.  Patient reports 2 weeks of pain in her left shoulder now radiating to her left chest for the past week.  Does report it is worse with movement and deep breathing.  Does report a little bit of a cough.  Does report prior smoking but had stopped up until the past 2 weeks she does report occasionally using a cigarette.  Patient was seen at the orthopedic clinic told she has some shoulder bursitis but given the chest pain was told to come into the emergency room to be evaluated.  Physical Exam   Triage Vital Signs: ED Triage Vitals  Encounter Vitals Group     BP 11/20/24 1058 (!) 162/98     Girls Systolic BP Percentile --      Girls Diastolic BP Percentile --      Boys Systolic BP Percentile --      Boys Diastolic BP Percentile --      Pulse Rate 11/20/24 1058 82     Resp 11/20/24 1058 18     Temp 11/20/24 1058 98 F (36.7 C)     Temp Source 11/20/24 1058 Oral     SpO2 11/20/24 1058 98 %     Weight 11/20/24 1059 127 lb 13.9 oz (58 kg)     Height 11/20/24 1059 5' 3 (1.6 m)     Head Circumference --      Peak Flow --      Pain Score 11/20/24 1059 8     Pain Loc --      Pain Education --      Exclude from Growth Chart --     Most recent vital signs: Vitals:   11/20/24 1058  BP: (!) 162/98  Pulse: 82  Resp: 18  Temp: 98 F (36.7 C)  SpO2: 98%     General: Awake, no distress.  CV:  Good peripheral perfusion.  Resp:  Normal effort.  Abd:  No distention.  Other:  No warmth or redness noted to the shoulder there is pain with palpation over the chest wall.  She has got clear lungs noted bilaterally.  Good distal pulse of the radial wrist.  Abdomen soft and  nontender.   ED Results / Procedures / Treatments   Labs (all labs ordered are listed, but only abnormal results are displayed) Labs Reviewed  CBC - Abnormal; Notable for the following components:      Result Value   Platelets 451 (*)    All other components within normal limits  RESP PANEL BY RT-PCR (RSV, FLU A&B, COVID)  [id number]  BASIC METABOLIC PANEL WITH GFR  D-DIMER, QUANTITATIVE  TROPONIN T, HIGH SENSITIVITY     EKG  My interpretation of EKG:  Normal sinus rate of 84 without any ST elevation or T wave versions, normal intervals  RADIOLOGY I have reviewed the xray personally and interpreted no evidence of any pneumonia   PROCEDURES:  Critical Care performed: No  .1-3 Lead EKG Interpretation  Performed by: Ernest Ronal BRAVO, MD Authorized by: Ernest Ronal BRAVO, MD     Interpretation: normal     ECG rate:  70   ECG  rate assessment: normal     Rhythm: sinus rhythm     Ectopy: none     Conduction: normal      MEDICATIONS ORDERED IN ED: Medications  oxyCODONE  (Oxy IR/ROXICODONE ) immediate release tablet 5 mg (5 mg Oral Given 11/20/24 1317)  iohexol  (OMNIPAQUE ) 350 MG/ML injection 75 mL (75 mLs Intravenous Contrast Given 11/20/24 1323)     IMPRESSION / MDM / ASSESSMENT AND PLAN / ED COURSE  I reviewed the triage vital signs and the nursing notes.   Patient's presentation is most consistent with acute presentation with potential threat to life or bodily function.   Patient comes in with left-sided chest pain.  Workup was done evaluate for ACS, PE with D-dimer given overall low risk, chest x-ray for any pneumonia, pneumothorax.  Seems more musculoskeletal no rash noted on examination.   Patient's D-dimer was elevated.  COVID was negative.  BMP was reassuring CBC shows slightly elevated platelets    IMPRESSION: 1. No evidence of pulmonary embolism. 2. Multiple indeterminate pulmonary nodules, largest a 1.1 cm solid nodule in the left lingula; recommend PET/CT,  and/or tissue sampling. 3. Indeterminate possible hypodense hepatic lesions, poorly visualized on this exam; consider dedicated contrast-enhanced CT or MRI of the abdomen for characterization.   Patient denied any abdominal pain. Discussed with patient incidental findings refer patient to pulmonary clinic, oncology I discussed with her my concerns for the size and need to be further evaluated to rule out any type of cancer.\  We discussed with patient she was already placed on steroids by orthopedics due to concerns for bursitis.  We discussed avoiding narcotics due to the risk of these she is requesting a muscle relaxer.  We can stated we can try some Flexeril  she reports having very tight muscles in the morning especially.  Patient be discharged home after repeat troponin.  The patient is on the cardiac monitor to evaluate for evidence of arrhythmia and/or significant heart rate changes.      FINAL CLINICAL IMPRESSION(S) / ED DIAGNOSES   Final diagnoses:  Pulmonary nodule  Atypical chest pain     Rx / DC Orders   ED Discharge Orders          Ordered    Ambulatory referral to Hematology / Oncology        11/20/24 1407    AMB  Referral to Pulmonary Nodule Clinic        11/20/24 1407    cyclobenzaprine  (FLEXERIL ) 5 MG tablet  3 times daily PRN        11/20/24 1422             Note:  This document was prepared using Dragon voice recognition software and may include unintentional dictation errors.   Ernest Ronal BRAVO, MD 11/20/24 1423  "

## 2024-11-20 NOTE — ED Triage Notes (Signed)
 Pt sent from [facility]- pt reports for 2 weeks she has been having left shoulder pain, pain has began radiating into left side of chest. Pain worsens with movement and deep breathing. Pt reports cough with some wheezing that began this am.

## 2024-11-20 NOTE — ED Notes (Signed)
 CCMD called to place pt on central monitoring.

## 2024-11-27 ENCOUNTER — Inpatient Hospital Stay

## 2024-11-27 ENCOUNTER — Inpatient Hospital Stay: Attending: Internal Medicine | Admitting: Internal Medicine

## 2024-11-27 ENCOUNTER — Encounter: Payer: Self-pay | Admitting: *Deleted

## 2024-11-27 ENCOUNTER — Encounter: Payer: Self-pay | Admitting: Internal Medicine

## 2024-11-27 VITALS — BP 139/88 | HR 87 | Temp 99.2°F | Resp 18 | Ht 63.0 in | Wt 132.9 lb

## 2024-11-27 DIAGNOSIS — R911 Solitary pulmonary nodule: Secondary | ICD-10-CM | POA: Diagnosis not present

## 2024-11-27 DIAGNOSIS — K769 Liver disease, unspecified: Secondary | ICD-10-CM | POA: Diagnosis present

## 2024-11-27 DIAGNOSIS — Z79899 Other long term (current) drug therapy: Secondary | ICD-10-CM | POA: Diagnosis not present

## 2024-11-27 DIAGNOSIS — R918 Other nonspecific abnormal finding of lung field: Secondary | ICD-10-CM | POA: Diagnosis present

## 2024-11-27 DIAGNOSIS — F17210 Nicotine dependence, cigarettes, uncomplicated: Secondary | ICD-10-CM | POA: Diagnosis not present

## 2024-11-27 MED ORDER — VARENICLINE TARTRATE 0.5 MG PO TABS: ORAL_TABLET | ORAL | 0 refills | Status: DC

## 2024-11-27 NOTE — Assessment & Plan Note (Addendum)
#   DEC 24th, 2025- 2. Multiple indeterminate pulmonary nodules, largest a 1.1 cm solid nodule in the left lingula.  Solitary pulmonary nodule, left upper lobe New 1 cm left upper lobe nodule with significant tobacco exposure increases malignancy risk. No lymphadenopathy, infection, or metastasis on imaging. Suspevcted Stage I lung cancer possible; requires further imaging and biopsy for diagnosis and staging. - Ordered PET scan to assess nodule metabolic activity and metastasis. - Referred to pulmonology for biopsy consideration post-PET scan. - Coordinated biopsy with pulmonology after PET scan. - Educated on next steps, including potential surgical resection if malignancy confirmed and stage I. - Discussed radiation therapy as alternative for non-surgical candidates, not first-line due to age and lung function. - Explained chemotherapy and immunotherapy not indicated for stage I. - Arranged follow-up post-biopsy and diagnostic workup. - Planned serial imaging for subcentimeter nodules, too small for PET or biopsy.  Liver nodule(s), under evaluation Small indeterminate liver nodules on CT, differential includes benign lesions, artifact, or less likely metastasis. No cirrhosis or alcoholic liver disease features. Further evaluation needed to rule out malignancy. - Ordered PET scan for liver nodules metabolic activity and metastasis. - Discussed possible liver MRI if PET inconclusive.  Nicotine dependence, cigarettes 20-30 pack-year history, resumed smoking <0.5 pack/day. Motivated to quit, requested pharmacologic aid. Smoking increases malignancy risk, especially with new pulmonary nodule. - Prescribed varenicline for cessation, sent to pharmacy. - Discussed varenicline adverse effects: depression, suicidal ideation, nightmares; advised discontinuation and provider notification if symptoms occur. - Counseled on smoking cessation importance, especially with pulmonary nodule.  # Pleuritic chest  pain: recommend NSAIDs prn.   Thank you Dr. Ernest MD for allowing me to participate in the care of your pleasant patient. Please do not hesitate to contact me with questions or concerns in the interim.   # DISPOSITION: # NO labs today-  # pulmonary referral re: lung nodule # PET ASAP  # follow up TBD- Dr.B  # 60 minutes face-to-face with the patient discussing the above plan of care; more than 50% of time spent on prognosis/ natural history; counseling and coordination.  # I reviewed the blood work- with the patient in detail; also reviewed the imaging independently [as summarized above]; and with the patient in detail.

## 2024-11-27 NOTE — Progress Notes (Signed)
 [facility] CONSULT NOTE  Patient Care Team: Gretta Comer POUR, NP as PCP - General (Internal Medicine) Rennie Cindy SAUNDERS, MD as Consulting Physician (Oncology) Verdene Gills, RN as Oncology Nurse Navigator  CHIEF COMPLAINTS/PURPOSE OF CONSULTATION: Lung nodule  Oncology History   No problem history exists.    HISTORY OF PRESENTING ILLNESS: Patient ambulating- independently.  Accompanied by husband  Discussed the use of AI scribe software for clinical note transcription with the patient, who gave verbal consent to proceed.  History of Present Illness   Krystal Grant is a 53 year old woman with a significant tobacco use history who presents for evaluation of newly identified pulmonary and hepatic lesions.  Recent CT imaging performed for acute chest pain revealed a new 1 cm nodule in the left upper lobe and multiple subcentimeter pulmonary nodules, none of which were present on prior imaging from 2023. The scan also identified possible small indeterminate liver lesions, though hepatic evaluation was limited by suboptimal technique.  She experienced acute chest pain and pressure, worsened by deep inspiration and lying supine. Pulmonary embolism was excluded by imaging. She denies history of pneumonia, chronic lung disease, or current respiratory infection. She has intermittent asthma, now requiring daily inhaler use, but no prior diagnosis of COPD or need for supplemental oxygen. She denies cardiac disease, including heart failure or prior myocardial infarction, and previously had a normal cardiac CT scan.  She has a longstanding tobacco use history, previously smoking one pack per day for 20-30 years, with recent resumption of less than half a pack per day after a seven-year period of abstinence and several years of vaping. She expresses strong motivation to quit and requested pharmacologic support for smoking cessation.  She expressed significant anxiety regarding the  newly identified lesions and requested expedited diagnostic evaluation due to concern for malignancy and the impact on her family.      Review of Systems  Constitutional:  Negative for chills, diaphoresis, fever, malaise/fatigue and weight loss.  HENT:  Negative for nosebleeds and sore throat.   Eyes:  Negative for double vision.  Respiratory:  Negative for cough, hemoptysis, sputum production, shortness of breath and wheezing.   Cardiovascular:  Positive for chest pain. Negative for palpitations, orthopnea and leg swelling.  Gastrointestinal:  Negative for abdominal pain, blood in stool, constipation, diarrhea, heartburn, melena, nausea and vomiting.  Genitourinary:  Negative for dysuria, frequency and urgency.  Musculoskeletal:  Negative for back pain and joint pain.  Skin: Negative.  Negative for itching and rash.  Neurological:  Negative for dizziness, tingling, focal weakness, weakness and headaches.  Endo/Heme/Allergies:  Does not bruise/bleed easily.  Psychiatric/Behavioral:  Negative for depression. The patient is not nervous/anxious and does not have insomnia.     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    SURGICAL HISTORY: Past Surgical History:  Procedure Laterality  Date   CARPAL TUNNEL RELEASE  06/28/2017   COLONOSCOPY WITH PROPOFOL  N/A 04/13/2023    Procedure: COLONOSCOPY WITH PROPOFOL ;  Surgeon: Unk Corinn Skiff, MD;  Location: [facility];  Service: Endoscopy;  Laterality: N/A;   ELBOW ARTHROSCOPY  06/28/2017   ESOPHAGOGASTRODUODENOSCOPY (EGD) WITH PROPOFOL  N/A 04/13/2023   Procedure: ESOPHAGOGASTRODUODENOSCOPY (EGD) WITH PROPOFOL ;  Surgeon: Unk Corinn Skiff, MD;  Location: [facility];  Service: Endoscopy;  Laterality: N/A;   XI ROBOTIC LAPAROSCOPIC ASSISTED APPENDECTOMY N/A 09/18/2021   Procedure: XI ROBOTIC LAPAROSCOPIC ASSISTED APPENDECTOMY;  Surgeon: Tye Millet, DO;  Location: [facility];  Service: General;  Laterality: N/A;    SOCIAL HISTORY: Social History   Socioeconomic History   Marital status: Married    Spouse name: Not on file   Number of children: 3   Years of education: 12   Highest education level: Not on file  Occupational History   Occupation: [employer]   Tobacco Use   Smoking status: Some Days    Types: Cigarettes   Smokeless tobacco: Never  Vaping Use   Vaping status: Former   Devices: non-nicotine  Substance and Sexual Activity   Alcohol use: No   Drug use: No   Sexual activity: Not on file  Other Topics Concern   Not on file  Social History Narrative   Lives with husband and kids   Caffeine use: 2 per day   Right handed    Social Drivers of Health   Tobacco Use: High Risk (11/27/2024)   Patient History    Smoking Tobacco Use: Some Days    Smokeless Tobacco Use: Never    Passive Exposure: Not on file  Financial Resource Strain: High Risk (10/08/2024)   Received from [facility]   Overall Financial Resource Strain (CARDIA)    Difficulty of Paying Living Expenses: Hard  Food Insecurity: No Food Insecurity (11/27/2024)   Epic    Worried About Programme Researcher, Broadcasting/film/video in the Last Year: Never true    Ran Out of Food in the Last Year: Never true  Recent Concern: Food Insecurity - Food Insecurity Present (10/08/2024)   Received from [facility]   Epic    Within the past 12 months, you worried that your food would run out before you got the money to buy more.: Sometimes true    Within the past 12 months, the food you bought just didn't last and you didn't have money to get more.: Never true  Transportation Needs: No Transportation Needs (11/27/2024)   Epic    Lack of Transportation (Medical): No    Lack of Transportation (Non-Medical): No  Physical Activity: Not on file  Stress: Not on file  Social Connections: Not on file  Intimate Partner Violence: Not At Risk (11/27/2024)   Epic    Fear of Current or Ex-Partner: No    Emotionally Abused: No    Physically Abused: No    Sexually Abused: No  Depression (PHQ2-9): Low Risk (11/27/2024)   Depression (PHQ2-9)    PHQ-2 Score: 3  Recent Concern: Depression (PHQ2-9) - Medium Risk (10/02/2024)   Depression (PHQ2-9)    PHQ-2 Score: 8  Alcohol Screen: Not on file  Housing: Low Risk (11/27/2024)   Epic    Unable to Pay for Housing in the Last Year: No    Number of Times Moved in the Last Year: 0    Homeless in the Last Year: No  Recent Concern: Housing - High Risk (  10/08/2024)   Received from [facility]   Epic    In the last 12 months, was there a time when you were not able to pay the mortgage or rent on time?: Yes    In the past 12 months, how many times have you moved where you were living?: 0    At any time in the past 12 months, were you homeless or living in a shelter (including now)?: No  Utilities: Not At Risk (11/27/2024)   Epic    Threatened with loss of utilities: No  Health Literacy: Not on file    FAMILY HISTORY: Family History  Problem Relation Age of Onset   Migraines Mother    Hypertension Mother    Heart attack Mother 79   Nephrolithiasis Mother    Healthy Father    Alcohol abuse Sister    Depression Sister    Uterine cancer Maternal Aunt    Arthritis Maternal Grandmother    Hearing loss Maternal Grandmother    Heart attack  Maternal Grandfather    Cancer Paternal Grandmother        Breast   Hearing loss Paternal Grandmother    Heart attack Paternal Grandmother    Breast cancer Paternal Grandmother    Heart attack Paternal Grandfather    Asthma Son    Depression Son    Mental illness Son    Learning disabilities Son     ALLERGIES:  is allergic to clindamycin hcl, cymbalta [duloxetine hcl], iodine, and serotonin reuptake inhibitors (ssris).  MEDICATIONS:  Current Outpatient Medications  Medication Sig Dispense Refill   albuterol  (VENTOLIN  HFA) 108 (90 Base) MCG/ACT inhaler INHALE 2 PUFFS INTO THE LUNGS EVERY 4 (FOUR) HOURS AS NEEDED FOR SHORTNESS OF BREATH 8 g 0   b complex vitamins capsule Take 1 capsule by mouth daily.     cyclobenzaprine  (FLEXERIL ) 5 MG tablet Take 1 tablet (5 mg total) by mouth 3 (three) times daily as needed for up to 5 days for muscle spasms. 15 tablet 0   EPINEPHrine  (EPIPEN  2-PAK) 0.3 mg/0.3 mL IJ SOAJ injection Inject 0.3 mg into the muscle as needed for anaphylaxis. 1 each 0   ibuprofen  (ADVIL ) 800 MG tablet Take 1 tablet (800 mg total) by mouth every 8 (eight) hours as needed for mild pain or moderate pain. 30 tablet 0   lisinopril  (ZESTRIL ) 5 MG tablet Take 1 tablet (5 mg total) by mouth daily. for blood pressure. 90 tablet 1   Multiple Vitamins-Minerals (MULTIVITAMIN WITH MINERALS) tablet Take 1 tablet by mouth daily.     omeprazole  (PRILOSEC) 20 MG capsule Take 1 capsule (20 mg total) by mouth daily. for heartburn. 90 capsule 1   promethazine  (PHENERGAN ) 25 MG tablet Take 25 mg by mouth every 6 (six) hours as needed.     triamcinolone  cream (KENALOG) 0.1 % Apply topically.     trimethoprim  (TRIMPEX ) 100 MG tablet Take 100 mg by mouth daily as needed. As needed     varenicline (CHANTIX) 0.5 MG tablet Days 1 to 3- 0.5 mg once daily,Days 4 to 7-0.5 mg twice daily,Maintenance day 8 and later 1 mg twice daily- may consider a temporary or permanent dose reduction if usual dose is not  tolerated 60 tablet 0   No current facility-administered medications for this visit.    PHYSICAL EXAMINATION:   Vitals:   11/27/24 1301  BP: 139/88  Pulse: 87  Resp: 18  Temp: 99.2 F (37.3 C)  SpO2: 100%  Filed Weights   11/27/24 1301  Weight: 132 lb 14.4 oz (60.3 kg)    Physical Exam Vitals and nursing note reviewed.  HENT:     Head: Normocephalic and atraumatic.     Mouth/Throat:     Pharynx: Oropharynx is clear.  Eyes:     Extraocular Movements: Extraocular movements intact.     Pupils: Pupils are equal, round, and reactive to light.  Cardiovascular:     Rate and Rhythm: Normal rate and regular rhythm.  Pulmonary:     Comments: Decreased breath sounds bilaterally.  Abdominal:     Palpations: Abdomen is soft.  Musculoskeletal:        General: Normal range of motion.     Cervical back: Normal range of motion.  Skin:    General: Skin is warm.  Neurological:     General: No focal deficit present.     Mental Status: She is alert and oriented to person, place, and time.  Psychiatric:        Behavior: Behavior normal.        Judgment: Judgment normal.     LABORATORY DATA:  I have reviewed the data as listed Lab Results  Component Value Date   WBC 7.7 11/20/2024   HGB 12.0 11/20/2024   HCT 36.6 11/20/2024   MCV 86.1 11/20/2024   PLT 451 (H) 11/20/2024   Recent Labs    10/02/24 1301 11/13/24 1428 11/20/24 1105  NA 141 137 137  K 4.2 4.6 4.8  CL 105 104 102  CO2 27 27 24   GLUCOSE 89 71 100*  BUN 9 16 13   CREATININE 0.91 0.88 0.79  CALCIUM 10.0 9.4 8.7*  GFRNONAA  --   --  >60  PROT 7.4  --   --   ALBUMIN 5.1  --   --   AST 17  --   --   ALT 15  --   --   ALKPHOS 59  --   --   BILITOT 0.6  --   --     RADIOGRAPHIC STUDIES: I have personally reviewed the radiological images as listed and agreed with the findings in the report. CT Angio Chest PE W and/or Wo Contrast Result Date: 11/20/2024 EXAM: CTA CHEST 11/20/2024 01:30:00 PM TECHNIQUE:  CTA of the chest was performed after the administration of 75 mL of iohexol  (OMNIPAQUE ) 350 MG/ML injection. Multiplanar reformatted images are provided for review. MIP images are provided for review. Automated exposure control, iterative reconstruction, and/or weight based adjustment of the mA/kV was utilized to reduce the radiation dose to as low as reasonably achievable. COMPARISON: Same-day chest radiograph. CLINICAL HISTORY: Left-sided chest pain, pleuritic chest pain, pulmonary embolism suspected. FINDINGS: PULMONARY ARTERIES: Pulmonary arteries are adequately opacified for evaluation. No acute pulmonary embolus. Main pulmonary artery is normal in caliber. MEDIASTINUM: The heart and pericardium demonstrate no acute abnormality. There is no acute abnormality of the thoracic aorta. LYMPH NODES: No mediastinal, hilar or axillary lymphadenopathy. LUNGS AND PLEURA: 1.1 x 1.0 cm left lingular nodule. Additional tiny pulmonary nodules bilaterally measuring up to 0.5 cm. Nodules in the lingula and right middle lobe are not seen on prior CT performed 08/17/2022. No focal consolidation or pulmonary edema. No evidence of pleural effusion or pneumothorax. UPPER ABDOMEN: Limited evaluation of the liver due to contrast bolus timing. However, query possible hypodense liver lesions. SOFT TISSUES AND BONES: No acute bone or soft tissue abnormality. IMPRESSION: 1. No evidence of pulmonary embolism. 2. Multiple indeterminate pulmonary nodules, largest  a 1.1 cm solid nodule in the left lingula; recommend PET/CT, and/or tissue sampling. 3. Indeterminate possible hypodense hepatic lesions, poorly visualized on this exam; consider dedicated contrast-enhanced CT or MRI of the abdomen for characterization. Electronically signed by: Michaeline Blanch MD 11/20/2024 01:43 PM EST RP Workstation: [id number]   DG Chest 2 View Result Date: 11/20/2024 EXAM: 2 VIEW(S) XRAY OF THE CHEST 11/20/2024 11:13:00 AM COMPARISON: 03/13/2017 CLINICAL  HISTORY: chest pain FINDINGS: LUNGS AND PLEURA: 9 mm left lower lung nodule versus nipple shadow. No pleural effusion. No pneumothorax. HEART AND MEDIASTINUM: No acute abnormality of the cardiac and mediastinal silhouettes. BONES AND SOFT TISSUES: No acute osseous abnormality. IMPRESSION: 1. 9 mm left lower lung nodular opacity versus nipple shadow; recommend repeat chest radiograph with nipple markers to confirm, and/or chest CT for further evaluation. Electronically signed by: Michaeline Blanch MD 11/20/2024 11:44 AM EST RP Workstation: [id number]     Nodule of upper lobe of left lung # DEC 24th, 2025- 2. Multiple indeterminate pulmonary nodules, largest a 1.1 cm solid nodule in the left lingula.  Solitary pulmonary nodule, left upper lobe New 1 cm left upper lobe nodule with significant tobacco exposure increases malignancy risk. No lymphadenopathy, infection, or metastasis on imaging. Suspevcted Stage I lung cancer possible; requires further imaging and biopsy for diagnosis and staging. - Ordered PET scan to assess nodule metabolic activity and metastasis. - Referred to pulmonology for biopsy consideration post-PET scan. - Coordinated biopsy with pulmonology after PET scan. - Educated on next steps, including potential surgical resection if malignancy confirmed and stage I. - Discussed radiation therapy as alternative for non-surgical candidates, not first-line due to age and lung function. - Explained chemotherapy and immunotherapy not indicated for stage I. - Arranged follow-up post-biopsy and diagnostic workup. - Planned serial imaging for subcentimeter nodules, too small for PET or biopsy.  Liver nodule(s), under evaluation Small indeterminate liver nodules on CT, differential includes benign lesions, artifact, or less likely metastasis. No cirrhosis or alcoholic liver disease features. Further evaluation needed to rule out malignancy. - Ordered PET scan for liver nodules metabolic activity and  metastasis. - Discussed possible liver MRI if PET inconclusive.  Nicotine dependence, cigarettes 20-30 pack-year history, resumed smoking <0.5 pack/day. Motivated to quit, requested pharmacologic aid. Smoking increases malignancy risk, especially with new pulmonary nodule. - Prescribed varenicline for cessation, sent to pharmacy. - Discussed varenicline adverse effects: depression, suicidal ideation, nightmares; advised discontinuation and provider notification if symptoms occur. - Counseled on smoking cessation importance, especially with pulmonary nodule.  # Pleuritic chest pain: recommend NSAIDs prn.   Thank you Dr. Ernest MD for allowing me to participate in the care of your pleasant patient. Please do not hesitate to contact me with questions or concerns in the interim.   # DISPOSITION: # NO labs today-  # pulmonary referral re: lung nodule # PET ASAP  # follow up TBD- Dr.B  # 60 minutes face-to-face with the patient discussing the above plan of care; more than 50% of time spent on prognosis/ natural history; counseling and coordination.  # I reviewed the blood work- with the patient in detail; also reviewed the imaging independently [as summarized above]; and with the patient in detail.      Cindy JONELLE Joe, MD 11/27/2024 2:18 PM

## 2024-11-27 NOTE — Progress Notes (Signed)
 Met with patient during initial consult Dr. Rennie. All questions answered during visit. Reviewed upcoming appts. Contact info given and instructed to call with any questions or needs. Pt verbalized understanding. Nothing further needed at this time.

## 2024-11-27 NOTE — Progress Notes (Signed)
 C/o pain lt side/axilla/back area, esp with deep breathes x2 weeks, cough, pain 4/10. Has noticed her voice has gotten hoarse.  Pt asking if she has cancer.

## 2024-12-04 ENCOUNTER — Other Ambulatory Visit: Payer: Self-pay | Admitting: Primary Care

## 2024-12-04 ENCOUNTER — Encounter
Admission: RE | Admit: 2024-12-04 | Discharge: 2024-12-04 | Disposition: A | Discharge status: Home / Self Care | Source: Ambulatory Visit | Attending: Internal Medicine | Admitting: Internal Medicine

## 2024-12-04 DIAGNOSIS — R911 Solitary pulmonary nodule: Secondary | ICD-10-CM | POA: Diagnosis present

## 2024-12-04 DIAGNOSIS — J3089 Other allergic rhinitis: Secondary | ICD-10-CM

## 2024-12-04 LAB — GLUCOSE, CAPILLARY: Glucose-Capillary: 92 mg/dL (ref 70–99)

## 2024-12-04 MED ORDER — FLUDEOXYGLUCOSE F - 18 (FDG) INJECTION
6.8000 | Freq: Once | INTRAVENOUS | Status: AC | PRN
  Administered 2024-12-04: 7.43 via INTRAVENOUS

## 2024-12-05 ENCOUNTER — Encounter: Payer: Self-pay | Admitting: *Deleted

## 2024-12-05 NOTE — Progress Notes (Signed)
 Pt called to review PET scan results. All questions answered during call. Instructed to keep appts as scheduled and to call back with any further questions or needs. Pt verbalized understanding.

## 2024-12-09 ENCOUNTER — Ambulatory Visit: Admitting: Student in an Organized Health Care Education/Training Program

## 2024-12-11 ENCOUNTER — Telehealth: Payer: Self-pay

## 2024-12-11 ENCOUNTER — Encounter: Payer: Self-pay | Admitting: Student in an Organized Health Care Education/Training Program

## 2024-12-11 ENCOUNTER — Ambulatory Visit (INDEPENDENT_AMBULATORY_CARE_PROVIDER_SITE_OTHER): Admitting: Student in an Organized Health Care Education/Training Program

## 2024-12-11 VITALS — BP 104/64 | HR 86 | Temp 98.4°F | Ht 63.0 in | Wt 135.6 lb

## 2024-12-11 DIAGNOSIS — Z87891 Personal history of nicotine dependence: Secondary | ICD-10-CM | POA: Diagnosis not present

## 2024-12-11 DIAGNOSIS — R911 Solitary pulmonary nodule: Secondary | ICD-10-CM | POA: Insufficient documentation

## 2024-12-11 NOTE — Progress Notes (Signed)
 "  Synopsis: Referred in for pulmonary nodule by Rennie Cindy SAUNDERS, MD Assessment & Plan  #Pulmonary Nodule  Nodule Location: Lingula Nodule Size: 1.1 cm Nodule Spiculation: No Associated Lymphadenopathy: No Smoking Status (former) and pack years: 30 Extrathoracic cancer > 5 years prior (no) SPN malignancy risk score [facility]): 12.8 %risk of malignancy ECOG: 0  The patient is here to discuss their imaging abnormalities which include a solitary pulmonary nodule in the left upper lobe's lingula that was incidentally found during a chest pain evaluation. A PET scan showed mild FDG avidity. The differential diagnosis includes malignancy as well as an infectious/inflammatory response. The nodule developed over the past two years, and was absent in a 2023 coronary CT scan.   We discussed the importance of diagnosis and staging in lung malignancies, and the approach to obtaining a tissue diagnosis which would include robotic assisted navigational bronchoscopy with endobronchial ultrasound guided sampling.  We also discussed the risks associated with the procedure which include a 2% risk of pneumothorax, infection, bleeding, and nondiagnostic procedure in detail.  I explained that patients typically are able to return home the same day of the procedure, but in rare cases admission to the hospital for observation and treatment is required.  After our discussion, the patient elected to proceed with the procedure  Recommendations: Procedural/ Surgical Case Request: VIDEO BRONCHOSCOPY WITH ENDOBRONCHIAL NAVIGATION; Future CT SUPER D CHEST WO CONTRAST; Future   Belva November, MD [facility]  I spent 60 minutes caring for this patient today, including preparing to see the patient, obtaining a medical history , reviewing a separately obtained history, performing a medically appropriate examination and/or evaluation, counseling and educating the  patient/family/caregiver, ordering medications, tests, or procedures, documenting clinical information in the electronic health record, and independently interpreting results (not separately reported/billed) and communicating results to the patient/family/caregiver  End of visit medications:  No orders of the defined types were placed in this encounter.   Current Medications[1]   Subjective:   PATIENT ID: Krystal Grant GENDER: female DOB: 1970/12/25, MRN: [id number]  Chief Complaint  Patient presents with   Consult    Nodule. SOB and wheezing when she lays down. Cough with clear sputum.  Albuterol - PRN    HPI Discussed the use of AI scribe software for clinical note transcription with the patient, who gave verbal consent to proceed.  History of Present Illness  Krystal Grant is a 54 year old female who presents for evaluation and biopsy of a left upper lobe nodule. She was referred by oncology for biopsy of the nodule.  She experienced severe chest pain radiating to her back, prompting emergency care on Christmas Eve due to the intensity of the pain with each breath. Initially, she visited an urgent care orthopedic facility suspecting a shoulder issue, but was redirected to the ER where a CT scan revealed a nodule in the left upper lobe and a fracture in her second rib. She is unsure how the rib fracture occurred, speculating it might have happened during Christmas decorating activities.  The nodule was further evaluated with a PET scan, which showed it to be mildly FTG avid. The PET showed FDG avidity in the rib, which resulted in discovery of the broken rib.  She experiences respiratory symptoms, including wheezing and a 'little whistle sound' primarily at night when lying down, prompting her to use an albuterol  inhaler. She has had the inhaler for 5-10 years for asthma-like symptoms triggered by allergies and illnesses, but this  is the most she has used it in the past month. She  uses it more frequently around Christmas and New Year's when her chest pain was most severe.  Her smoking history includes starting at age 33, quitting seven years ago, but recently resuming smoking due to stressors such as a divorce and a family death. She currently smokes around five cigarettes a day and is using Chantix  to quit again.  Her past medical history includes Ehlers-Danlos syndrome, diagnosed about ten years ago, which causes joint pain, sprains, and dislocations. No history of cancer or pneumothorax.   Ancillary information including prior medications, full medical/surgical/family/social histories, and PFTs (when available) are listed below and have been reviewed.    Review of Systems  Constitutional:  Negative for chills and fever.  Respiratory:  Negative for cough, hemoptysis, sputum production, shortness of breath and wheezing.   Cardiovascular:  Positive for chest pain.     Objective:   Vitals:   12/11/24 1011  BP: 104/64  Pulse: 86  Temp: 98.4 F (36.9 C)  SpO2: 100%  Weight: 135 lb 9.6 oz (61.5 kg)  Height: 5' 3 (1.6 m)   100% on RA  BMI Readings from Last 3 Encounters:  12/11/24 24.02 kg/m  11/27/24 23.54 kg/m  11/20/24 22.65 kg/m   Wt Readings from Last 3 Encounters:  12/11/24 135 lb 9.6 oz (61.5 kg)  11/27/24 132 lb 14.4 oz (60.3 kg)  11/20/24 127 lb 13.9 oz (58 kg)     Physical Exam Constitutional:      Appearance: Normal appearance.  Cardiovascular:     Rate and Rhythm: Normal rate and regular rhythm.     Pulses: Normal pulses.     Heart sounds: Normal heart sounds.  Pulmonary:     Effort: Pulmonary effort is normal.     Breath sounds: Normal breath sounds.  Neurological:     General: No focal deficit present.     Mental Status: She is alert and oriented to person, place, and time. Mental status is at baseline.       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     Family History  Problem Relation Age of Onset   Migraines Mother    Hypertension Mother    Heart attack Mother 67   Nephrolithiasis Mother    Healthy Father    Alcohol abuse Sister    Depression Sister    Uterine cancer Maternal Aunt    Arthritis Maternal Grandmother    Hearing loss Maternal Grandmother    Heart attack Maternal Grandfather    Cancer Paternal Grandmother        Breast   Hearing loss Paternal Grandmother    Heart attack Paternal Grandmother    Breast cancer Paternal Grandmother    Heart attack Paternal Grandfather    Asthma Son  Depression Son    Mental illness Son    Learning disabilities Son      Past Surgical History:  Procedure Laterality Date   CARPAL TUNNEL RELEASE  06/28/2017   COLONOSCOPY WITH PROPOFOL  N/A 04/13/2023   Procedure: COLONOSCOPY WITH PROPOFOL ;  Surgeon: Unk Corinn Skiff, MD;  Location: [facility];  Service: Endoscopy;  Laterality: N/A;   ELBOW ARTHROSCOPY  06/28/2017   ESOPHAGOGASTRODUODENOSCOPY (EGD) WITH PROPOFOL  N/A 04/13/2023   Procedure: ESOPHAGOGASTRODUODENOSCOPY (EGD) WITH PROPOFOL ;  Surgeon: Unk Corinn Skiff, MD;  Location: [facility];  Service: Endoscopy;  Laterality: N/A;   XI ROBOTIC LAPAROSCOPIC ASSISTED APPENDECTOMY N/A 09/18/2021   Procedure: XI ROBOTIC LAPAROSCOPIC ASSISTED APPENDECTOMY;  Surgeon: Tye Millet, DO;  Location:  [facility];  Service: General;  Laterality: N/A;    Social History   Socioeconomic History   Marital status: Married    Spouse name: Not on file   Number of children: 3   Years of education: 12   Highest education level: Not on file  Occupational History   Occupation: [employer]   Tobacco Use   Smoking status: Every Day    Current packs/day: 0.25    Types: Cigarettes   Smokeless tobacco: Never   Tobacco comments:    Smokes 5 or less cigarettes daily- khj 12/11/2024        Started smoking at 54 years old    Smoked 1 PPD at her heaviest  Vaping Use   Vaping status: Former   Devices: non-nicotine  Substance and Sexual Activity   Alcohol use: No   Drug use: No   Sexual activity: Not on file  Other Topics Concern   Not on file  Social History Narrative   Lives with husband and kids   Caffeine use: 2 per day   Right handed    Social Drivers of Health   Tobacco Use: High Risk (12/11/2024)   Patient History    Smoking Tobacco Use: Every Day    Smokeless Tobacco Use: Never    Passive Exposure: Not on file  Financial Resource Strain: High Risk (10/08/2024)   Received from [facility]   Overall Financial Resource Strain (CARDIA)    Difficulty of Paying Living Expenses: Hard  Food Insecurity: No Food Insecurity (11/27/2024)   Epic    Worried About Programme Researcher, Broadcasting/film/video in the Last Year: Never true    Ran Out of Food in the Last Year: Never true  Recent Concern: Food Insecurity - Food Insecurity Present (10/08/2024)   Received from [facility]   Epic    Within the past 12 months, you worried that your food would run out before you got the money to buy more.: Sometimes true    Within the past 12 months, the food you bought just didn't last and you didn't have money to get more.: Never true  Transportation Needs: No Transportation Needs (11/27/2024)   Epic    Lack of Transportation (Medical): No    Lack of Transportation (Non-Medical): No   Physical Activity: Not on file  Stress: Not on file  Social Connections: Not on file  Intimate Partner Violence: Not At Risk (11/27/2024)   Epic    Fear of Current or Ex-Partner: No    Emotionally Abused: No    Physically Abused: No    Sexually Abused: No  Depression (PHQ2-9): Low Risk (11/27/2024)   Depression (PHQ2-9)    PHQ-2 Score: 3  Recent Concern: Depression (PHQ2-9) - Medium Risk (  10/02/2024)   Depression (PHQ2-9)    PHQ-2 Score: 8  Alcohol Screen: Not on file  Housing: Low Risk (11/27/2024)   Epic    Unable to Pay for Housing in the Last Year: No    Number of Times Moved in the Last Year: 0    Homeless in the Last Year: No  Recent Concern: Housing - High Risk (10/08/2024)   Received [facility]   Epic    In the last 12 months, was there a time when you were not able to pay the mortgage or rent on time?: Yes    In the past 12 months, how many times have you moved where you were living?: 0    At any time in the past 12 months, were you homeless or living in a shelter (including now)?: No  Utilities: Not At Risk (11/27/2024)   Epic    Threatened with loss of utilities: No  Health Literacy: Not on file     Allergies[2]   CBC    Component Value Date/Time   WBC 7.7 11/20/2024 1105   RBC 4.25 11/20/2024 1105   HGB 12.0 11/20/2024 1105   HCT 36.6 11/20/2024 1105   PLT 451 (H) 11/20/2024 1105   MCV 86.1 11/20/2024 1105   MCH 28.2 11/20/2024 1105   MCHC 32.8 11/20/2024 1105   RDW 14.5 11/20/2024 1105   LYMPHSABS 1.5 06/04/2024 1534   MONOABS 0.2 06/04/2024 1534   EOSABS 0.1 06/04/2024 1534   BASOSABS 0.1 06/04/2024 1534    Pulmonary Functions Testing Results:     No data to display          Outpatient Medications Prior to Visit  Medication Sig Dispense Refill   albuterol  (VENTOLIN  HFA) 108 (90 Base) MCG/ACT inhaler INHALE 2 PUFFS INTO THE LUNGS EVERY 4 (FOUR) HOURS AS NEEDED FOR SHORTNESS OF BREATH 8.5 each 0   b complex vitamins  capsule Take 1 capsule by mouth daily.     EPINEPHrine  (EPIPEN  2-PAK) 0.3 mg/0.3 mL IJ SOAJ injection Inject 0.3 mg into the muscle as needed for anaphylaxis. 1 each 0   ibuprofen  (ADVIL ) 800 MG tablet Take 1 tablet (800 mg total) by mouth every 8 (eight) hours as needed for mild pain or moderate pain. 30 tablet 0   lisinopril  (ZESTRIL ) 5 MG tablet Take 1 tablet (5 mg total) by mouth daily. for blood pressure. 90 tablet 1   Multiple Vitamins-Minerals (MULTIVITAMIN WITH MINERALS) tablet Take 1 tablet by mouth daily.     omeprazole  (PRILOSEC) 20 MG capsule Take 1 capsule (20 mg total) by mouth daily. for heartburn. 90 capsule 1   promethazine  (PHENERGAN ) 25 MG tablet Take 25 mg by mouth every 6 (six) hours as needed.     traMADol  (ULTRAM ) 50 MG tablet Take 50 mg by mouth every 6 (six) hours as needed.     triamcinolone  cream (KENALOG) 0.1 % Apply topically.     trimethoprim  (TRIMPEX ) 100 MG tablet Take 100 mg by mouth daily as needed. As needed     varenicline  (CHANTIX ) 0.5 MG tablet Days 1 to 3- 0.5 mg once daily,Days 4 to 7-0.5 mg twice daily,Maintenance day 8 and later 1 mg twice daily- may consider a temporary or permanent dose reduction if usual dose is not tolerated 60 tablet 0   No facility-administered medications prior to visit.      [1]  Current Outpatient Medications:    albuterol  (VENTOLIN  HFA) 108 (90 Base) MCG/ACT inhaler, INHALE 2  PUFFS INTO THE LUNGS EVERY 4 (FOUR) HOURS AS NEEDED FOR SHORTNESS OF BREATH, Disp: 8.5 each, Rfl: 0   b complex vitamins capsule, Take 1 capsule by mouth daily., Disp: , Rfl:    EPINEPHrine  (EPIPEN  2-PAK) 0.3 mg/0.3 mL IJ SOAJ injection, Inject 0.3 mg into the muscle as needed for anaphylaxis., Disp: 1 each, Rfl: 0   ibuprofen  (ADVIL ) 800 MG tablet, Take 1 tablet (800 mg total) by mouth every 8 (eight) hours as needed for mild pain or moderate pain., Disp: 30 tablet, Rfl: 0   lisinopril  (ZESTRIL ) 5 MG tablet, Take 1 tablet (5 mg total) by mouth daily. for  blood pressure., Disp: 90 tablet, Rfl: 1   Multiple Vitamins-Minerals (MULTIVITAMIN WITH MINERALS) tablet, Take 1 tablet by mouth daily., Disp: , Rfl:    omeprazole  (PRILOSEC) 20 MG capsule, Take 1 capsule (20 mg total) by mouth daily. for heartburn., Disp: 90 capsule, Rfl: 1   promethazine  (PHENERGAN ) 25 MG tablet, Take 25 mg by mouth every 6 (six) hours as needed., Disp: , Rfl:    traMADol  (ULTRAM ) 50 MG tablet, Take 50 mg by mouth every 6 (six) hours as needed., Disp: , Rfl:    triamcinolone  cream (KENALOG) 0.1 %, Apply topically., Disp: , Rfl:    trimethoprim  (TRIMPEX ) 100 MG tablet, Take 100 mg by mouth daily as needed. As needed, Disp: , Rfl:    varenicline  (CHANTIX ) 0.5 MG tablet, Days 1 to 3- 0.5 mg once daily,Days 4 to 7-0.5 mg twice daily,Maintenance day 8 and later 1 mg twice daily- may consider a temporary or permanent dose reduction if usual dose is not tolerated, Disp: 60 tablet, Rfl: 0 [2]  Allergies Allergen Reactions   Clindamycin Hcl    Cymbalta [Duloxetine Hcl]     disorientation   Iodine Other (See Comments)    Had a vaginal procedure using iodine- her vaginal burned and peeled   Serotonin Reuptake Inhibitors (Ssris) Other (See Comments)    insomnia   "

## 2024-12-11 NOTE — Telephone Encounter (Signed)
 For the codes 68372, [id number], [id number], [id number], [id number]. [id number], [phone] Prior Auth Not Required per automated service

## 2024-12-11 NOTE — Telephone Encounter (Signed)
 Robotic Bronch with [facility] 12/31/2024 at 9:00am Lung Nodule [id number], [id number], [id number], [id number], [id number], [id number], [id number]  Donzell please see bronch info.  Patient is aware and Bronch email has been sent.

## 2024-12-11 NOTE — H&P (View-Only) (Signed)
 "  Synopsis: Referred in for pulmonary nodule by Rennie Cindy SAUNDERS, MD Assessment & Plan  #Pulmonary Nodule  Nodule Location: Lingula Nodule Size: 1.1 cm Nodule Spiculation: No Associated Lymphadenopathy: No Smoking Status (former) and pack years: 30 Extrathoracic cancer > 5 years prior (no) SPN malignancy risk score [facility]): 12.8 %risk of malignancy ECOG: 0  The patient is here to discuss their imaging abnormalities which include a solitary pulmonary nodule in the left upper lobe's lingula that was incidentally found during a chest pain evaluation. A PET scan showed mild FDG avidity. The differential diagnosis includes malignancy as well as an infectious/inflammatory response. The nodule developed over the past two years, and was absent in a 2023 coronary CT scan.   We discussed the importance of diagnosis and staging in lung malignancies, and the approach to obtaining a tissue diagnosis which would include robotic assisted navigational bronchoscopy with endobronchial ultrasound guided sampling.  We also discussed the risks associated with the procedure which include a 2% risk of pneumothorax, infection, bleeding, and nondiagnostic procedure in detail.  I explained that patients typically are able to return home the same day of the procedure, but in rare cases admission to the hospital for observation and treatment is required.  After our discussion, the patient elected to proceed with the procedure  Recommendations: Procedural/ Surgical Case Request: VIDEO BRONCHOSCOPY WITH ENDOBRONCHIAL NAVIGATION; Future CT SUPER D CHEST WO CONTRAST; Future   Belva November, MD [facility]  I spent 60 minutes caring for this patient today, including preparing to see the patient, obtaining a medical history , reviewing a separately obtained history, performing a medically appropriate examination and/or evaluation, counseling and educating the  patient/family/caregiver, ordering medications, tests, or procedures, documenting clinical information in the electronic health record, and independently interpreting results (not separately reported/billed) and communicating results to the patient/family/caregiver  End of visit medications:  No orders of the defined types were placed in this encounter.   Current Medications[1]   Subjective:   PATIENT ID: Krystal Grant GENDER: female DOB: 1970/12/25, MRN: [id number]  Chief Complaint  Patient presents with   Consult    Nodule. SOB and wheezing when she lays down. Cough with clear sputum.  Albuterol - PRN    HPI Discussed the use of AI scribe software for clinical note transcription with the patient, who gave verbal consent to proceed.  History of Present Illness  Krystal Grant is a 54 year old female who presents for evaluation and biopsy of a left upper lobe nodule. She was referred by oncology for biopsy of the nodule.  She experienced severe chest pain radiating to her back, prompting emergency care on Christmas Eve due to the intensity of the pain with each breath. Initially, she visited an urgent care orthopedic facility suspecting a shoulder issue, but was redirected to the ER where a CT scan revealed a nodule in the left upper lobe and a fracture in her second rib. She is unsure how the rib fracture occurred, speculating it might have happened during Christmas decorating activities.  The nodule was further evaluated with a PET scan, which showed it to be mildly FTG avid. The PET showed FDG avidity in the rib, which resulted in discovery of the broken rib.  She experiences respiratory symptoms, including wheezing and a 'little whistle sound' primarily at night when lying down, prompting her to use an albuterol  inhaler. She has had the inhaler for 5-10 years for asthma-like symptoms triggered by allergies and illnesses, but this  is the most she has used it in the past month. She  uses it more frequently around Christmas and New Year's when her chest pain was most severe.  Her smoking history includes starting at age 33, quitting seven years ago, but recently resuming smoking due to stressors such as a divorce and a family death. She currently smokes around five cigarettes a day and is using Chantix  to quit again.  Her past medical history includes Ehlers-Danlos syndrome, diagnosed about ten years ago, which causes joint pain, sprains, and dislocations. No history of cancer or pneumothorax.   Ancillary information including prior medications, full medical/surgical/family/social histories, and PFTs (when available) are listed below and have been reviewed.    Review of Systems  Constitutional:  Negative for chills and fever.  Respiratory:  Negative for cough, hemoptysis, sputum production, shortness of breath and wheezing.   Cardiovascular:  Positive for chest pain.     Objective:   Vitals:   12/11/24 1011  BP: 104/64  Pulse: 86  Temp: 98.4 F (36.9 C)  SpO2: 100%  Weight: 135 lb 9.6 oz (61.5 kg)  Height: 5' 3 (1.6 m)   100% on RA  BMI Readings from Last 3 Encounters:  12/11/24 24.02 kg/m  11/27/24 23.54 kg/m  11/20/24 22.65 kg/m   Wt Readings from Last 3 Encounters:  12/11/24 135 lb 9.6 oz (61.5 kg)  11/27/24 132 lb 14.4 oz (60.3 kg)  11/20/24 127 lb 13.9 oz (58 kg)     Physical Exam Constitutional:      Appearance: Normal appearance.  Cardiovascular:     Rate and Rhythm: Normal rate and regular rhythm.     Pulses: Normal pulses.     Heart sounds: Normal heart sounds.  Pulmonary:     Effort: Pulmonary effort is normal.     Breath sounds: Normal breath sounds.  Neurological:     General: No focal deficit present.     Mental Status: She is alert and oriented to person, place, and time. Mental status is at baseline.       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     Family History  Problem Relation Age of Onset   Migraines Mother    Hypertension Mother    Heart attack Mother 67   Nephrolithiasis Mother    Healthy Father    Alcohol abuse Sister    Depression Sister    Uterine cancer Maternal Aunt    Arthritis Maternal Grandmother    Hearing loss Maternal Grandmother    Heart attack Maternal Grandfather    Cancer Paternal Grandmother        Breast   Hearing loss Paternal Grandmother    Heart attack Paternal Grandmother    Breast cancer Paternal Grandmother    Heart attack Paternal Grandfather    Asthma Son  Depression Son    Mental illness Son    Learning disabilities Son      Past Surgical History:  Procedure Laterality Date   CARPAL TUNNEL RELEASE  06/28/2017   COLONOSCOPY WITH PROPOFOL  N/A 04/13/2023   Procedure: COLONOSCOPY WITH PROPOFOL ;  Surgeon: Unk Corinn Skiff, MD;  Location: [facility];  Service: Endoscopy;  Laterality: N/A;   ELBOW ARTHROSCOPY  06/28/2017   ESOPHAGOGASTRODUODENOSCOPY (EGD) WITH PROPOFOL  N/A 04/13/2023   Procedure: ESOPHAGOGASTRODUODENOSCOPY (EGD) WITH PROPOFOL ;  Surgeon: Unk Corinn Skiff, MD;  Location: [facility];  Service: Endoscopy;  Laterality: N/A;   XI ROBOTIC LAPAROSCOPIC ASSISTED APPENDECTOMY N/A 09/18/2021   Procedure: XI ROBOTIC LAPAROSCOPIC ASSISTED APPENDECTOMY;  Surgeon: Tye Millet, DO;  Location:  [facility];  Service: General;  Laterality: N/A;    Social History   Socioeconomic History   Marital status: Married    Spouse name: Not on file   Number of children: 3   Years of education: 12   Highest education level: Not on file  Occupational History   Occupation: [employer]   Tobacco Use   Smoking status: Every Day    Current packs/day: 0.25    Types: Cigarettes   Smokeless tobacco: Never   Tobacco comments:    Smokes 5 or less cigarettes daily- khj 12/11/2024        Started smoking at 54 years old    Smoked 1 PPD at her heaviest  Vaping Use   Vaping status: Former   Devices: non-nicotine  Substance and Sexual Activity   Alcohol use: No   Drug use: No   Sexual activity: Not on file  Other Topics Concern   Not on file  Social History Narrative   Lives with husband and kids   Caffeine use: 2 per day   Right handed    Social Drivers of Health   Tobacco Use: High Risk (12/11/2024)   Patient History    Smoking Tobacco Use: Every Day    Smokeless Tobacco Use: Never    Passive Exposure: Not on file  Financial Resource Strain: High Risk (10/08/2024)   Received from [facility]   Overall Financial Resource Strain (CARDIA)    Difficulty of Paying Living Expenses: Hard  Food Insecurity: No Food Insecurity (11/27/2024)   Epic    Worried About Programme Researcher, Broadcasting/film/video in the Last Year: Never true    Ran Out of Food in the Last Year: Never true  Recent Concern: Food Insecurity - Food Insecurity Present (10/08/2024)   Received from [facility]   Epic    Within the past 12 months, you worried that your food would run out before you got the money to buy more.: Sometimes true    Within the past 12 months, the food you bought just didn't last and you didn't have money to get more.: Never true  Transportation Needs: No Transportation Needs (11/27/2024)   Epic    Lack of Transportation (Medical): No    Lack of Transportation (Non-Medical): No   Physical Activity: Not on file  Stress: Not on file  Social Connections: Not on file  Intimate Partner Violence: Not At Risk (11/27/2024)   Epic    Fear of Current or Ex-Partner: No    Emotionally Abused: No    Physically Abused: No    Sexually Abused: No  Depression (PHQ2-9): Low Risk (11/27/2024)   Depression (PHQ2-9)    PHQ-2 Score: 3  Recent Concern: Depression (PHQ2-9) - Medium Risk (  10/02/2024)   Depression (PHQ2-9)    PHQ-2 Score: 8  Alcohol Screen: Not on file  Housing: Low Risk (11/27/2024)   Epic    Unable to Pay for Housing in the Last Year: No    Number of Times Moved in the Last Year: 0    Homeless in the Last Year: No  Recent Concern: Housing - High Risk (10/08/2024)   Received from [facility]   Epic    In the last 12 months, was there a time when you were not able to pay the mortgage or rent on time?: Yes    In the past 12 months, how many times have you moved where you were living?: 0    At any time in the past 12 months, were you homeless or living in a shelter (including now)?: No  Utilities: Not At Risk (11/27/2024)   Epic    Threatened with loss of utilities: No  Health Literacy: Not on file     Allergies[2]   CBC    Component Value Date/Time   WBC 7.7 11/20/2024 1105   RBC 4.25 11/20/2024 1105   HGB 12.0 11/20/2024 1105   HCT 36.6 11/20/2024 1105   PLT 451 (H) 11/20/2024 1105   MCV 86.1 11/20/2024 1105   MCH 28.2 11/20/2024 1105   MCHC 32.8 11/20/2024 1105   RDW 14.5 11/20/2024 1105   LYMPHSABS 1.5 06/04/2024 1534   MONOABS 0.2 06/04/2024 1534   EOSABS 0.1 06/04/2024 1534   BASOSABS 0.1 06/04/2024 1534    Pulmonary Functions Testing Results:     No data to display          Outpatient Medications Prior to Visit  Medication Sig Dispense Refill   albuterol  (VENTOLIN  HFA) 108 (90 Base) MCG/ACT inhaler INHALE 2 PUFFS INTO THE LUNGS EVERY 4 (FOUR) HOURS AS NEEDED FOR SHORTNESS OF BREATH 8.5 each 0   b complex vitamins  capsule Take 1 capsule by mouth daily.     EPINEPHrine  (EPIPEN  2-PAK) 0.3 mg/0.3 mL IJ SOAJ injection Inject 0.3 mg into the muscle as needed for anaphylaxis. 1 each 0   ibuprofen  (ADVIL ) 800 MG tablet Take 1 tablet (800 mg total) by mouth every 8 (eight) hours as needed for mild pain or moderate pain. 30 tablet 0   lisinopril  (ZESTRIL ) 5 MG tablet Take 1 tablet (5 mg total) by mouth daily. for blood pressure. 90 tablet 1   Multiple Vitamins-Minerals (MULTIVITAMIN WITH MINERALS) tablet Take 1 tablet by mouth daily.     omeprazole  (PRILOSEC) 20 MG capsule Take 1 capsule (20 mg total) by mouth daily. for heartburn. 90 capsule 1   promethazine  (PHENERGAN ) 25 MG tablet Take 25 mg by mouth every 6 (six) hours as needed.     traMADol  (ULTRAM ) 50 MG tablet Take 50 mg by mouth every 6 (six) hours as needed.     triamcinolone  cream (KENALOG) 0.1 % Apply topically.     trimethoprim  (TRIMPEX ) 100 MG tablet Take 100 mg by mouth daily as needed. As needed     varenicline  (CHANTIX ) 0.5 MG tablet Days 1 to 3- 0.5 mg once daily,Days 4 to 7-0.5 mg twice daily,Maintenance day 8 and later 1 mg twice daily- may consider a temporary or permanent dose reduction if usual dose is not tolerated 60 tablet 0   No facility-administered medications prior to visit.      [1]  Current Outpatient Medications:    albuterol  (VENTOLIN  HFA) 108 (90 Base) MCG/ACT inhaler, INHALE 2  PUFFS INTO THE LUNGS EVERY 4 (FOUR) HOURS AS NEEDED FOR SHORTNESS OF BREATH, Disp: 8.5 each, Rfl: 0   b complex vitamins capsule, Take 1 capsule by mouth daily., Disp: , Rfl:    EPINEPHrine  (EPIPEN  2-PAK) 0.3 mg/0.3 mL IJ SOAJ injection, Inject 0.3 mg into the muscle as needed for anaphylaxis., Disp: 1 each, Rfl: 0   ibuprofen  (ADVIL ) 800 MG tablet, Take 1 tablet (800 mg total) by mouth every 8 (eight) hours as needed for mild pain or moderate pain., Disp: 30 tablet, Rfl: 0   lisinopril  (ZESTRIL ) 5 MG tablet, Take 1 tablet (5 mg total) by mouth daily. for  blood pressure., Disp: 90 tablet, Rfl: 1   Multiple Vitamins-Minerals (MULTIVITAMIN WITH MINERALS) tablet, Take 1 tablet by mouth daily., Disp: , Rfl:    omeprazole  (PRILOSEC) 20 MG capsule, Take 1 capsule (20 mg total) by mouth daily. for heartburn., Disp: 90 capsule, Rfl: 1   promethazine  (PHENERGAN ) 25 MG tablet, Take 25 mg by mouth every 6 (six) hours as needed., Disp: , Rfl:    traMADol  (ULTRAM ) 50 MG tablet, Take 50 mg by mouth every 6 (six) hours as needed., Disp: , Rfl:    triamcinolone  cream (KENALOG) 0.1 %, Apply topically., Disp: , Rfl:    trimethoprim  (TRIMPEX ) 100 MG tablet, Take 100 mg by mouth daily as needed. As needed, Disp: , Rfl:    varenicline  (CHANTIX ) 0.5 MG tablet, Days 1 to 3- 0.5 mg once daily,Days 4 to 7-0.5 mg twice daily,Maintenance day 8 and later 1 mg twice daily- may consider a temporary or permanent dose reduction if usual dose is not tolerated, Disp: 60 tablet, Rfl: 0 [2]  Allergies Allergen Reactions   Clindamycin Hcl    Cymbalta [Duloxetine Hcl]     disorientation   Iodine Other (See Comments)    Had a vaginal procedure using iodine- her vaginal burned and peeled   Serotonin Reuptake Inhibitors (Ssris) Other (See Comments)    insomnia   "

## 2024-12-11 NOTE — Telephone Encounter (Signed)
 Noted. Nothing further needed.

## 2024-12-11 NOTE — Patient Instructions (Signed)
" °  VISIT SUMMARY: During your visit, we evaluated and discussed the plan for the nodule found in your left upper lung. We also reviewed your respiratory symptoms and smoking history.  YOUR PLAN: -SOLITARY PULMONARY NODULE, LEFT UPPER LOBE: A solitary pulmonary nodule is a small, round growth in the lung that can be benign or malignant. Your PET scan showed mild activity, which means it could be cancer, an inflammatory response, or an infection. We have scheduled a bronchoscopy with biopsy on February 3rd, 2026, to determine the nature of the nodule. A CT scan on February 2nd, 2026, will help us  plan the procedure. During the bronchoscopy, we will use an intra-procedural CT scan to locate the nodule and may perform an ultrasound-guided biopsy of lymph nodes if they are enlarged. We discussed the risks of the procedure, including a less than 2% chance of bleeding and pneumothorax, and you were informed about possible post-procedure symptoms like a sore throat and minor coughing up of blood.  INSTRUCTIONS: Please follow up with the scheduled CT scan on February 2nd, 2026, and the bronchoscopy with biopsy on February 3rd, 2026. If you experience any severe symptoms or have concerns before these appointments, contact our office immediately.                      Contains text generated by Abridge.                                 Contains text generated by Abridge.   "

## 2024-12-19 ENCOUNTER — Other Ambulatory Visit: Payer: Self-pay | Admitting: Internal Medicine

## 2024-12-24 ENCOUNTER — Other Ambulatory Visit: Payer: Self-pay

## 2024-12-24 ENCOUNTER — Encounter: Payer: Self-pay | Admitting: Student in an Organized Health Care Education/Training Program

## 2024-12-24 ENCOUNTER — Encounter
Admission: RE | Admit: 2024-12-24 | Discharge: 2024-12-24 | Disposition: A | Discharge status: Home / Self Care | Source: Ambulatory Visit | Attending: Student in an Organized Health Care Education/Training Program | Admitting: Student in an Organized Health Care Education/Training Program

## 2024-12-24 HISTORY — DX: Unspecified asthma, uncomplicated: J45.909

## 2024-12-24 NOTE — Patient Instructions (Signed)
 Your procedure is scheduled on: Tuesday 12/31/24 Report to the Registration Desk on the 1st floor of the [facility]. To find out your arrival time, please call [phone] between 1PM - 3PM on: Monday 12/30/24 If your arrival time is 6:00 am, do not arrive before that time as the [facility] entrance doors do not open until 6:00 am.  REMEMBER: Instructions that are not followed completely may result in serious medical risk, up to and including death; or upon the discretion of your surgeon and anesthesiologist your surgery may need to be rescheduled.  Do not eat food after midnight the night before surgery.  No gum chewing or hard candies.   One week prior to surgery: Stop Anti-inflammatories (NSAIDS) such as Advil , Aleve, Ibuprofen , Motrin , Naproxen, Naprosyn and Aspirin based products such as Excedrin, Goody's Powder, BC Powder. Stop ANY OVER THE COUNTER supplements until after surgery.  You may however, continue to take Tylenol  if needed for pain up until the day of surgery.   Continue taking all of your other prescription medications up until the day of surgery.  ON THE DAY OF SURGERY ONLY TAKE THESE MEDICATIONS WITH SIPS OF WATER :  omeprazole  (PRILOSEC) 20 MG   Use inhalers on the day of surgery and bring to the hospital. albuterol  (VENTOLIN  HFA) 108 (90 Base) MCG/ACT inhaler    No Alcohol for 24 hours before or after surgery.  No Smoking including e-cigarettes for 24 hours before surgery.  No chewable tobacco products for at least 6 hours before surgery.  No nicotine patches on the day of surgery.  Do not use any recreational drugs for at least a week (preferably 2 weeks) before your surgery.  Please be advised that the combination of cocaine and anesthesia may have negative outcomes, up to and including death. If you test positive for cocaine, your surgery will be cancelled.  On the morning of surgery brush your teeth with toothpaste and water , you may rinse your  mouth with mouthwash if you wish. Do not swallow any toothpaste or mouthwash.  Take a fresh shower the morning of your procedure.   Do not wear jewelry, make-up, hairpins, clips or nail polish.  For welded (permanent) jewelry: bracelets, anklets, waist bands, etc.  Please have this removed prior to surgery.  If it is not removed, there is a chance that hospital personnel will need to cut it off on the day of surgery.  Do not wear lotions, powders, or perfumes.   Do not shave body hair from the neck down 48 hours before surgery.  Contact lenses, hearing aids and dentures may not be worn into surgery.  Do not bring valuables to the hospital. [facility] is not responsible for any missing/lost belongings or valuables.   Notify your doctor if there is any change in your medical condition (cold, fever, infection).  Wear comfortable clothing (specific to your surgery type) to the hospital.  After surgery, you can help prevent lung complications by doing breathing exercises.  Take deep breaths and cough every 1-2 hours. Your doctor may order a device called an Incentive Spirometer to help you take deep breaths. When coughing or sneezing, hold a pillow firmly against your incision with both hands. This is called splinting. Doing this helps protect your incision. It also decreases belly discomfort.  If you are being admitted to the hospital overnight, leave your suitcase in the car. After surgery it may be brought to your room.  In case of increased patient census, it may  be necessary for you, the patient, to continue your postoperative care in the Same Day Surgery department.  If you are being discharged the day of surgery, you will not be allowed to drive home. You will need a responsible individual to drive you home and stay with you for 24 hours after surgery.   If you are taking public transportation, you will need to have a responsible individual with you.  Please call the  Pre-admissions Testing Dept. at [phone] if you have any questions about these instructions.  Surgery Visitation Policy:  Patients having surgery or a procedure may have two visitors.  Children under the age of 36 must have an adult with them who is not the patient.  Inpatient Visitation:    Visiting hours are 7 a.m. to 8 p.m. Up to four visitors are allowed at one time in a patient room. The visitors may rotate out with other people during the day.  One visitor age 60 or older may stay with the patient overnight and must be in the room by 8 p.m.   Merchandiser, Retail to address health-related social needs:  [email or web address]

## 2024-12-24 NOTE — Telephone Encounter (Signed)
 Per secure chat with Dr. Isadora- yes, she can proceed with it.

## 2024-12-25 ENCOUNTER — Other Ambulatory Visit: Payer: Self-pay | Admitting: Primary Care

## 2024-12-25 DIAGNOSIS — F411 Generalized anxiety disorder: Secondary | ICD-10-CM

## 2024-12-25 NOTE — Telephone Encounter (Signed)
 Pt reported at 11/13/24 OV she no longer takes fluoxetine .   Spoke with pt asking if she restarted med. Pt states she has not restarted med and did not request refill. Says she will contact [facility] again to inform them and have them take med off of auto-refill.   Fyi to [place].

## 2024-12-26 NOTE — Telephone Encounter (Signed)
 Noted.

## 2024-12-30 ENCOUNTER — Ambulatory Visit

## 2024-12-30 ENCOUNTER — Ambulatory Visit
Admission: RE | Admit: 2024-12-30 | Discharge: 2024-12-30 | Attending: Student in an Organized Health Care Education/Training Program

## 2024-12-30 DIAGNOSIS — R911 Solitary pulmonary nodule: Secondary | ICD-10-CM

## 2024-12-31 ENCOUNTER — Other Ambulatory Visit: Payer: Self-pay

## 2024-12-31 ENCOUNTER — Ambulatory Visit
Admission: RE | Admit: 2024-12-31 | Discharge: 2024-12-31 | Disposition: A | Discharge status: Home / Self Care | Source: Home / Self Care | Attending: Student in an Organized Health Care Education/Training Program | Admitting: Student in an Organized Health Care Education/Training Program

## 2024-12-31 ENCOUNTER — Ambulatory Visit

## 2024-12-31 ENCOUNTER — Encounter
Admission: RE | Disposition: A | Discharge status: Home / Self Care | Payer: Self-pay | Source: Home / Self Care | Attending: Student in an Organized Health Care Education/Training Program

## 2024-12-31 ENCOUNTER — Ambulatory Visit: Payer: Self-pay | Admitting: Urgent Care

## 2024-12-31 ENCOUNTER — Encounter: Payer: Self-pay | Admitting: Student in an Organized Health Care Education/Training Program

## 2024-12-31 DIAGNOSIS — R911 Solitary pulmonary nodule: Secondary | ICD-10-CM | POA: Diagnosis not present

## 2024-12-31 LAB — BODY FLUID CELL COUNT WITH DIFFERENTIAL: Total Nucleated Cell Count, Fluid: 1 uL (ref 0–1000)

## 2024-12-31 MED ORDER — ONDANSETRON HCL 4 MG/2ML IJ SOLN: 4.0000 mg | Freq: Once | INTRAMUSCULAR | Status: DC | PRN

## 2024-12-31 MED ORDER — SUGAMMADEX SODIUM 200 MG/2ML IV SOLN
INTRAVENOUS | Status: DC | PRN
  Administered 2024-12-31: 200 mg via INTRAVENOUS

## 2024-12-31 MED ORDER — FENTANYL CITRATE (PF) 100 MCG/2ML IJ SOLN
INTRAMUSCULAR | Status: AC
  Filled 2024-12-31: qty 2

## 2024-12-31 MED ORDER — LIDOCAINE HCL (CARDIAC) PF 100 MG/5ML IV SOSY
PREFILLED_SYRINGE | INTRAVENOUS | Status: DC | PRN
  Administered 2024-12-31: 100 mg via INTRAVENOUS

## 2024-12-31 MED ORDER — FENTANYL CITRATE (PF) 100 MCG/2ML IJ SOLN: 25.0000 ug | INTRAMUSCULAR | Status: DC | PRN

## 2024-12-31 MED ORDER — ROCURONIUM BROMIDE 100 MG/10ML IV SOLN
INTRAVENOUS | Status: DC | PRN
  Administered 2024-12-31: 70 mg via INTRAVENOUS

## 2024-12-31 MED ORDER — CHLORHEXIDINE GLUCONATE 0.12 % MT SOLN: 15.0000 mL | Freq: Once | OROMUCOSAL | Status: DC

## 2024-12-31 MED ORDER — ORAL CARE MOUTH RINSE: 15.0000 mL | Freq: Once | OROMUCOSAL | Status: DC

## 2024-12-31 MED ORDER — DEXMEDETOMIDINE HCL IN NACL 80 MCG/20ML IV SOLN
INTRAVENOUS | Status: DC | PRN
  Administered 2024-12-31 (×2): 10 ug via INTRAVENOUS

## 2024-12-31 MED ORDER — ONDANSETRON HCL 4 MG/2ML IJ SOLN
INTRAMUSCULAR | Status: DC | PRN
  Administered 2024-12-31: 4 mg via INTRAVENOUS

## 2024-12-31 MED ORDER — LIDOCAINE HCL (PF) 2 % IJ SOLN
INTRAMUSCULAR | Status: AC
  Filled 2024-12-31: qty 5

## 2024-12-31 MED ORDER — MIDAZOLAM HCL (PF) 2 MG/2ML IJ SOLN
INTRAMUSCULAR | Status: DC | PRN
  Administered 2024-12-31: 2 mg via INTRAVENOUS

## 2024-12-31 MED ORDER — DEXAMETHASONE SOD PHOSPHATE PF 10 MG/ML IJ SOLN
INTRAMUSCULAR | Status: DC | PRN
  Administered 2024-12-31: 10 mg via INTRAVENOUS

## 2024-12-31 MED ORDER — PROPOFOL 10 MG/ML IV BOLUS
INTRAVENOUS | Status: AC
  Filled 2024-12-31: qty 20

## 2024-12-31 MED ORDER — FENTANYL CITRATE (PF) 100 MCG/2ML IJ SOLN
INTRAMUSCULAR | Status: DC | PRN
  Administered 2024-12-31: 100 ug via INTRAVENOUS

## 2024-12-31 MED ORDER — ONDANSETRON HCL 4 MG/2ML IJ SOLN
INTRAMUSCULAR | Status: AC
  Filled 2024-12-31: qty 2

## 2024-12-31 MED ORDER — LACTATED RINGERS IV SOLN: INTRAVENOUS | Status: DC

## 2024-12-31 MED ORDER — DEXAMETHASONE SOD PHOSPHATE PF 10 MG/ML IJ SOLN
INTRAMUSCULAR | Status: AC
  Filled 2024-12-31: qty 1

## 2024-12-31 MED ORDER — ACETAMINOPHEN 10 MG/ML IV SOLN: 1000.0000 mg | Freq: Once | INTRAVENOUS | Status: DC | PRN

## 2024-12-31 MED ORDER — PROPOFOL 1000 MG/100ML IV EMUL
INTRAVENOUS | Status: AC
  Filled 2024-12-31: qty 100

## 2024-12-31 MED ORDER — PROPOFOL 10 MG/ML IV BOLUS
INTRAVENOUS | Status: DC | PRN
  Administered 2024-12-31: 200 mg via INTRAVENOUS
  Administered 2024-12-31: 125 ug/kg/min via INTRAVENOUS

## 2024-12-31 MED ORDER — MIDAZOLAM HCL 2 MG/2ML IJ SOLN
INTRAMUSCULAR | Status: AC
  Filled 2024-12-31: qty 2

## 2024-12-31 MED ORDER — ROCURONIUM BROMIDE 10 MG/ML (PF) SYRINGE
PREFILLED_SYRINGE | INTRAVENOUS | Status: AC
  Filled 2024-12-31: qty 10

## 2024-12-31 NOTE — Transfer of Care (Signed)
 Immediate Anesthesia Transfer of Care Note  Patient: Krystal Grant  Procedure(s) Performed: VIDEO BRONCHOSCOPY WITH ENDOBRONCHIAL NAVIGATION (Bilateral)  Patient Location: PACU  Anesthesia Type:General  Level of Consciousness: awake, alert , and oriented  Airway & Oxygen Therapy: Patient Spontanous Breathing and Patient connected to face mask oxygen  Post-op Assessment: Report given to RN and Post -op Vital signs reviewed and stable  Post vital signs: Reviewed and stable  Last Vitals:  Vitals Value Taken Time  BP 107/70 12/31/24 11:23  Temp 36.1 C 12/31/24 11:20  Pulse 82 12/31/24 11:25  Resp 17 12/31/24 11:25  SpO2 100 % 12/31/24 11:25  Vitals shown include unfiled device data.  Last Pain:  Vitals:   12/31/24 1120  PainSc: 0-No pain         Complications: No notable events documented.

## 2024-12-31 NOTE — Anesthesia Postprocedure Evaluation (Signed)
"   Anesthesia Post Note  Patient: Krystal Grant  Procedure(s) Performed: VIDEO BRONCHOSCOPY WITH ENDOBRONCHIAL NAVIGATION (Bilateral)  Patient location during evaluation: PACU Anesthesia Type: General Level of consciousness: awake and alert Pain management: pain level controlled Vital Signs Assessment: post-procedure vital signs reviewed and stable Respiratory status: spontaneous breathing, nonlabored ventilation, respiratory function stable and patient connected to nasal cannula oxygen Cardiovascular status: blood pressure returned to baseline and stable Postop Assessment: no apparent nausea or vomiting Anesthetic complications: no   No notable events documented.   Last Vitals:  Vitals:   12/31/24 1155 12/31/24 1206  BP: 120/80 (!) 145/90  Pulse: 75 81  Resp: 20 20  Temp: (!) 36.2 C (!) 36.3 C  SpO2: 100% 100%    Last Pain:  Vitals:   12/31/24 1206  TempSrc: Temporal  PainSc: 0-No pain                 Fairy A Harmonii Karle      "

## 2024-12-31 NOTE — Anesthesia Procedure Notes (Signed)
 Procedure Name: Intubation Date/Time: 12/31/2024 10:13 AM  Performed by: Jestine Palma, CRNAPre-anesthesia Checklist: Patient identified, Emergency Drugs available, Suction available and Patient being monitored Patient Re-evaluated:Patient Re-evaluated prior to induction Oxygen Delivery Method: Circle system utilized Preoxygenation: Pre-oxygenation with 100% oxygen Induction Type: IV induction Ventilation: Mask ventilation without difficulty Laryngoscope Size: McGrath and 3 Grade View: Grade I Tube type: Oral Number of attempts: 1 Airway Equipment and Method: Stylet and Oral airway Placement Confirmation: ETT inserted through vocal cords under direct vision, positive ETCO2 and breath sounds checked- equal and bilateral Secured at: 19 cm Tube secured with: Tape Dental Injury: Teeth and Oropharynx as per pre-operative assessment

## 2024-12-31 NOTE — Op Note (Signed)
 Video Bronchoscopy with Robotic Assisted Bronchoscopic Navigation   Date of Operation: 12/31/2024   Pre-op Diagnosis: lung nodule  Surgeon: Belva November, MD  Anesthesia: General endotracheal anesthesia  Operation: Flexible video fiberoptic bronchoscopy with robotic assistance and biopsies.  Estimated Blood Loss: Minimal  Complications: None  Indications and History: Krystal Grant is a 54 y.o. female with history of smoking presenting with a LUL nodule.  Recommendation made to achieve a tissue diagnosis via robotic assisted navigational bronchoscopy.  The risks, benefits, complications, treatment options and expected outcomes were discussed with the patient.  The possibilities of pneumothorax, pneumonia, reaction to medication, pulmonary aspiration, perforation of a viscus, bleeding, failure to diagnose a condition and creating a complication requiring transfusion or operation were discussed with the patient who freely signed the consent.    Description of Procedure: The patient was seen in the Preoperative Area, was examined and was deemed appropriate to proceed.  The patient was taken to [facility] procedure room 2, identified as Delon Angles and the procedure verified as Flexible Video Fiberoptic Bronchoscopy.  A Time Out was held and the above information confirmed.   Prior to the date of the procedure a high-resolution CT scan of the chest was performed. Utilizing ION software program a virtual tracheobronchial tree was generated to allow the creation of distinct navigation pathways to the patient's parenchymal abnormalities. After being taken to the operating room general anesthesia was initiated and the patient  was orally intubated. The video fiberoptic bronchoscope was introduced via the endotracheal tube and a general inspection was performed which showed normal right and left lung anatomy. Aspiration of the bilateral mainstems was completed to remove any remaining secretions. Robotic  catheter inserted into patient's endotracheal tube.   Target #1 LUL nodule: The distinct navigation pathways prepared prior to this procedure were then utilized to navigate to patient's lesion identified on CT scan. The robotic catheter was secured into place and the vision probe was withdrawn.  Lesion location was approximated using fluoroscopy.  Local registration and targeting was performed using GE 3D OEC mobile C-arm three-dimensional imaging. Under fluoroscopic guidance transbronchial brushings, transbronchial needle biopsies, and transbronchial forceps biopsies were performed to be sent for cytology, culture and pathology.  Needle-in-lesion was confirmed using GE 3D mobile C-arm.    Tool in lesion:    EBUS: The EBUS bronchoscope was introduced and the hilar and mediastinal lymph node stations were examined and none were noted to be enlarged. No lymph node samples were obtained.  At the end of the procedure a general airway inspection was performed and there was no evidence of active bleeding. We then performed a BAL in the Lingula with 60 mL in, 45 mL out. Fluid return was noted to have mucus plugs. The bronchoscope was removed.  The patient tolerated the procedure well. There was no significant blood loss and there were no obvious complications. A post-procedural chest x-ray is pending.  Samples Target #1: LUL - Lingula nodule 1. Transbronchial needle brushings from LUL nodule 2. Transbronchial Wang needle biopsies from LUL nodule 3. Transbronchial forceps biopsies from LUL nodule 4. Bronchoalveolar lavage from Lingula    Plans:  The patient will be discharged from the PACU to home when recovered from anesthesia and after chest x-ray is reviewed. We will review the cytology, pathology and microbiology results with the patient when they become available. Outpatient followup will be with myself.  Belva November, MD [facility] 12/31/2024 11:15 AM

## 2025-01-01 ENCOUNTER — Encounter: Payer: Self-pay | Admitting: Student in an Organized Health Care Education/Training Program

## 2025-01-02 LAB — ACID FAST SMEAR (AFB, MYCOBACTERIA): Acid Fast Smear: NEGATIVE

## 2025-01-03 LAB — AEROBIC/ANAEROBIC CULTURE W GRAM STAIN (SURGICAL/DEEP WOUND)
Culture: NO GROWTH
Gram Stain: NONE SEEN

## 2025-01-03 LAB — CULTURE, FUNGUS WITHOUT SMEAR

## 2025-01-07 ENCOUNTER — Inpatient Hospital Stay: Attending: Internal Medicine | Admitting: Internal Medicine
